# Patient Record
Sex: Female | Born: 1960 | Race: Black or African American | Hispanic: No | Marital: Single | Smoking: Never smoker
Health system: Southern US, Community
[De-identification: ages and names within clinical notes are randomized; demographics above are authoritative.]

## PROBLEM LIST (undated history)

## (undated) DIAGNOSIS — F329 Major depressive disorder, single episode, unspecified: Secondary | ICD-10-CM

## (undated) DIAGNOSIS — I10 Essential (primary) hypertension: Secondary | ICD-10-CM

## (undated) DIAGNOSIS — F32A Depression, unspecified: Secondary | ICD-10-CM

## (undated) DIAGNOSIS — F1910 Other psychoactive substance abuse, uncomplicated: Secondary | ICD-10-CM

## (undated) DIAGNOSIS — K219 Gastro-esophageal reflux disease without esophagitis: Secondary | ICD-10-CM

## (undated) DIAGNOSIS — K6289 Other specified diseases of anus and rectum: Secondary | ICD-10-CM

## (undated) DIAGNOSIS — T7840XA Allergy, unspecified, initial encounter: Secondary | ICD-10-CM

## (undated) DIAGNOSIS — F419 Anxiety disorder, unspecified: Secondary | ICD-10-CM

## (undated) HISTORY — DX: Other psychoactive substance abuse, uncomplicated: F19.10

## (undated) HISTORY — PX: TUBAL LIGATION: SHX77

## (undated) HISTORY — DX: Allergy, unspecified, initial encounter: T78.40XA

## (undated) HISTORY — PX: CHOLECYSTECTOMY: SHX55

---

## 2001-03-25 ENCOUNTER — Emergency Department (HOSPITAL_COMMUNITY): Admission: EM | Admit: 2001-03-25 | Discharge: 2001-03-25 | Payer: Self-pay | Admitting: *Deleted

## 2001-03-25 ENCOUNTER — Encounter: Payer: Self-pay | Admitting: *Deleted

## 2001-04-27 ENCOUNTER — Emergency Department (HOSPITAL_COMMUNITY): Admission: EM | Admit: 2001-04-27 | Discharge: 2001-04-27 | Payer: Self-pay | Admitting: Emergency Medicine

## 2001-05-16 ENCOUNTER — Emergency Department (HOSPITAL_COMMUNITY): Admission: EM | Admit: 2001-05-16 | Discharge: 2001-05-16 | Payer: Self-pay | Admitting: Emergency Medicine

## 2001-08-29 ENCOUNTER — Emergency Department (HOSPITAL_COMMUNITY): Admission: EM | Admit: 2001-08-29 | Discharge: 2001-08-29 | Payer: Self-pay | Admitting: *Deleted

## 2002-03-10 ENCOUNTER — Encounter: Payer: Self-pay | Admitting: Family Medicine

## 2002-03-10 ENCOUNTER — Inpatient Hospital Stay (HOSPITAL_COMMUNITY): Admission: AD | Admit: 2002-03-10 | Discharge: 2002-03-12 | Payer: Self-pay | Admitting: Family Medicine

## 2002-05-25 ENCOUNTER — Emergency Department (HOSPITAL_COMMUNITY): Admission: EM | Admit: 2002-05-25 | Discharge: 2002-05-25 | Payer: Self-pay | Admitting: Emergency Medicine

## 2002-06-09 ENCOUNTER — Emergency Department (HOSPITAL_COMMUNITY): Admission: EM | Admit: 2002-06-09 | Discharge: 2002-06-09 | Payer: Self-pay | Admitting: Emergency Medicine

## 2002-06-18 ENCOUNTER — Encounter: Payer: Self-pay | Admitting: Emergency Medicine

## 2002-06-18 ENCOUNTER — Emergency Department (HOSPITAL_COMMUNITY): Admission: EM | Admit: 2002-06-18 | Discharge: 2002-06-18 | Payer: Self-pay | Admitting: Emergency Medicine

## 2002-07-21 ENCOUNTER — Ambulatory Visit (HOSPITAL_COMMUNITY): Admission: RE | Admit: 2002-07-21 | Discharge: 2002-07-21 | Payer: Self-pay | Admitting: Family Medicine

## 2002-07-21 ENCOUNTER — Encounter: Payer: Self-pay | Admitting: Family Medicine

## 2002-10-08 ENCOUNTER — Emergency Department (HOSPITAL_COMMUNITY): Admission: EM | Admit: 2002-10-08 | Discharge: 2002-10-08 | Payer: Self-pay | Admitting: Emergency Medicine

## 2002-10-08 ENCOUNTER — Encounter: Payer: Self-pay | Admitting: Emergency Medicine

## 2003-08-10 ENCOUNTER — Encounter: Payer: Self-pay | Admitting: Family Medicine

## 2003-08-10 ENCOUNTER — Ambulatory Visit (HOSPITAL_COMMUNITY): Admission: RE | Admit: 2003-08-10 | Discharge: 2003-08-10 | Payer: Self-pay | Admitting: Family Medicine

## 2004-03-08 ENCOUNTER — Emergency Department (HOSPITAL_COMMUNITY): Admission: EM | Admit: 2004-03-08 | Discharge: 2004-03-08 | Payer: Self-pay | Admitting: Emergency Medicine

## 2004-05-29 ENCOUNTER — Emergency Department (HOSPITAL_COMMUNITY): Admission: EM | Admit: 2004-05-29 | Discharge: 2004-05-29 | Payer: Self-pay | Admitting: Emergency Medicine

## 2005-01-03 ENCOUNTER — Emergency Department (HOSPITAL_COMMUNITY): Admission: EM | Admit: 2005-01-03 | Discharge: 2005-01-03 | Payer: Self-pay | Admitting: Emergency Medicine

## 2005-03-29 ENCOUNTER — Emergency Department (HOSPITAL_COMMUNITY): Admission: EM | Admit: 2005-03-29 | Discharge: 2005-03-29 | Payer: Self-pay | Admitting: Emergency Medicine

## 2005-04-19 ENCOUNTER — Emergency Department (HOSPITAL_COMMUNITY): Admission: EM | Admit: 2005-04-19 | Discharge: 2005-04-19 | Payer: Self-pay | Admitting: Emergency Medicine

## 2005-06-19 ENCOUNTER — Emergency Department (HOSPITAL_COMMUNITY): Admission: EM | Admit: 2005-06-19 | Discharge: 2005-06-19 | Payer: Self-pay | Admitting: Emergency Medicine

## 2005-08-15 ENCOUNTER — Emergency Department (HOSPITAL_COMMUNITY): Admission: EM | Admit: 2005-08-15 | Discharge: 2005-08-15 | Payer: Self-pay | Admitting: Emergency Medicine

## 2005-09-24 ENCOUNTER — Emergency Department (HOSPITAL_COMMUNITY): Admission: EM | Admit: 2005-09-24 | Discharge: 2005-09-24 | Payer: Self-pay | Admitting: Emergency Medicine

## 2005-12-01 ENCOUNTER — Ambulatory Visit: Payer: Self-pay | Admitting: Psychiatry

## 2005-12-01 ENCOUNTER — Inpatient Hospital Stay (HOSPITAL_COMMUNITY): Admission: EM | Admit: 2005-12-01 | Discharge: 2005-12-02 | Payer: Self-pay | Admitting: Psychiatry

## 2005-12-04 ENCOUNTER — Emergency Department (HOSPITAL_COMMUNITY): Admission: EM | Admit: 2005-12-04 | Discharge: 2005-12-04 | Payer: Self-pay | Admitting: Emergency Medicine

## 2006-02-21 ENCOUNTER — Emergency Department (HOSPITAL_COMMUNITY): Admission: EM | Admit: 2006-02-21 | Discharge: 2006-02-21 | Payer: Self-pay | Admitting: Emergency Medicine

## 2006-02-25 ENCOUNTER — Emergency Department (HOSPITAL_COMMUNITY): Admission: EM | Admit: 2006-02-25 | Discharge: 2006-02-25 | Payer: Self-pay | Admitting: Emergency Medicine

## 2006-03-11 ENCOUNTER — Ambulatory Visit (HOSPITAL_COMMUNITY): Admission: RE | Admit: 2006-03-11 | Discharge: 2006-03-11 | Payer: Self-pay | Admitting: Family Medicine

## 2006-06-12 ENCOUNTER — Emergency Department (HOSPITAL_COMMUNITY): Admission: EM | Admit: 2006-06-12 | Discharge: 2006-06-12 | Payer: Self-pay | Admitting: Emergency Medicine

## 2006-07-08 ENCOUNTER — Emergency Department (HOSPITAL_COMMUNITY): Admission: EM | Admit: 2006-07-08 | Discharge: 2006-07-08 | Payer: Self-pay | Admitting: Emergency Medicine

## 2007-06-16 ENCOUNTER — Ambulatory Visit (HOSPITAL_COMMUNITY): Admission: RE | Admit: 2007-06-16 | Discharge: 2007-06-16 | Payer: Self-pay | Admitting: Family Medicine

## 2007-10-06 ENCOUNTER — Emergency Department (HOSPITAL_COMMUNITY): Admission: EM | Admit: 2007-10-06 | Discharge: 2007-10-06 | Payer: Self-pay | Admitting: Emergency Medicine

## 2007-11-19 ENCOUNTER — Emergency Department (HOSPITAL_COMMUNITY): Admission: EM | Admit: 2007-11-19 | Discharge: 2007-11-19 | Payer: Self-pay | Admitting: Emergency Medicine

## 2008-03-24 ENCOUNTER — Emergency Department (HOSPITAL_COMMUNITY): Admission: EM | Admit: 2008-03-24 | Discharge: 2008-03-24 | Payer: Self-pay | Admitting: Emergency Medicine

## 2008-05-05 ENCOUNTER — Emergency Department (HOSPITAL_COMMUNITY): Admission: EM | Admit: 2008-05-05 | Discharge: 2008-05-05 | Payer: Self-pay | Admitting: Emergency Medicine

## 2008-05-27 ENCOUNTER — Observation Stay (HOSPITAL_COMMUNITY): Admission: EM | Admit: 2008-05-27 | Discharge: 2008-05-28 | Payer: Self-pay | Admitting: Emergency Medicine

## 2008-05-29 ENCOUNTER — Emergency Department (HOSPITAL_COMMUNITY): Admission: EM | Admit: 2008-05-29 | Discharge: 2008-05-29 | Payer: Self-pay | Admitting: Emergency Medicine

## 2008-05-31 ENCOUNTER — Emergency Department (HOSPITAL_COMMUNITY): Admission: EM | Admit: 2008-05-31 | Discharge: 2008-05-31 | Payer: Self-pay | Admitting: Emergency Medicine

## 2008-06-16 ENCOUNTER — Ambulatory Visit (HOSPITAL_COMMUNITY): Admission: RE | Admit: 2008-06-16 | Discharge: 2008-06-16 | Payer: Self-pay | Admitting: Family Medicine

## 2009-04-04 ENCOUNTER — Emergency Department (HOSPITAL_COMMUNITY): Admission: EM | Admit: 2009-04-04 | Discharge: 2009-04-04 | Payer: Self-pay | Admitting: Emergency Medicine

## 2009-04-05 ENCOUNTER — Emergency Department (HOSPITAL_COMMUNITY): Admission: EM | Admit: 2009-04-05 | Discharge: 2009-04-05 | Payer: Self-pay | Admitting: Emergency Medicine

## 2009-04-06 ENCOUNTER — Emergency Department (HOSPITAL_COMMUNITY): Admission: EM | Admit: 2009-04-06 | Discharge: 2009-04-06 | Payer: Self-pay | Admitting: Emergency Medicine

## 2009-08-16 ENCOUNTER — Emergency Department (HOSPITAL_COMMUNITY): Admission: EM | Admit: 2009-08-16 | Discharge: 2009-08-16 | Payer: Self-pay | Admitting: Emergency Medicine

## 2009-09-20 ENCOUNTER — Emergency Department (HOSPITAL_COMMUNITY): Admission: EM | Admit: 2009-09-20 | Discharge: 2009-09-20 | Payer: Self-pay | Admitting: Emergency Medicine

## 2009-11-30 ENCOUNTER — Ambulatory Visit (HOSPITAL_COMMUNITY): Admission: RE | Admit: 2009-11-30 | Discharge: 2009-11-30 | Payer: Self-pay | Admitting: Family Medicine

## 2010-01-23 ENCOUNTER — Emergency Department (HOSPITAL_COMMUNITY): Admission: EM | Admit: 2010-01-23 | Discharge: 2010-01-23 | Payer: Self-pay | Admitting: Emergency Medicine

## 2010-05-18 ENCOUNTER — Emergency Department (HOSPITAL_COMMUNITY): Admission: EM | Admit: 2010-05-18 | Discharge: 2010-05-18 | Payer: Self-pay | Admitting: Emergency Medicine

## 2010-05-20 ENCOUNTER — Emergency Department (HOSPITAL_COMMUNITY): Admission: EM | Admit: 2010-05-20 | Discharge: 2010-05-20 | Payer: Self-pay | Admitting: Emergency Medicine

## 2010-08-24 ENCOUNTER — Emergency Department (HOSPITAL_COMMUNITY): Admission: EM | Admit: 2010-08-24 | Discharge: 2010-08-24 | Payer: Self-pay | Admitting: Emergency Medicine

## 2010-08-25 ENCOUNTER — Inpatient Hospital Stay (HOSPITAL_COMMUNITY): Admission: EM | Admit: 2010-08-25 | Discharge: 2010-08-28 | Payer: Self-pay | Admitting: Emergency Medicine

## 2010-08-31 ENCOUNTER — Emergency Department (HOSPITAL_COMMUNITY): Admission: EM | Admit: 2010-08-31 | Discharge: 2010-08-31 | Payer: Self-pay | Admitting: Emergency Medicine

## 2010-11-02 ENCOUNTER — Emergency Department (HOSPITAL_COMMUNITY)
Admission: EM | Admit: 2010-11-02 | Discharge: 2010-11-02 | Payer: Self-pay | Source: Home / Self Care | Admitting: Emergency Medicine

## 2010-11-05 LAB — COMPREHENSIVE METABOLIC PANEL
ALT: 21 U/L (ref 0–35)
AST: 23 U/L (ref 0–37)
Albumin: 3.7 g/dL (ref 3.5–5.2)
Alkaline Phosphatase: 77 U/L (ref 39–117)
BUN: 7 mg/dL (ref 6–23)
CO2: 23 mEq/L (ref 19–32)
Calcium: 9.3 mg/dL (ref 8.4–10.5)
Chloride: 110 mEq/L (ref 96–112)
Creatinine, Ser: 0.8 mg/dL (ref 0.4–1.2)
GFR calc Af Amer: >60 mL/min (ref >60)
GFR calc non Af Amer: >60 mL/min (ref >60)
Glucose, Bld: 105 mg/dL — ABNORMAL HIGH (ref 70–99)
Potassium: 3.6 mEq/L (ref 3.5–5.1)
Sodium: 140 mEq/L (ref 135–145)
Total Bilirubin: 0.7 mg/dL (ref 0.3–1.2)
Total Protein: 7.4 g/dL (ref 6.0–8.3)

## 2010-11-05 LAB — URINALYSIS, ROUTINE W REFLEX MICROSCOPIC
Bilirubin Urine: NEGATIVE
Ketones, ur: NEGATIVE mg/dL
Leukocytes, UA: NEGATIVE
Nitrite: NEGATIVE
Protein, ur: NEGATIVE mg/dL
Specific Gravity, Urine: 1.03 (ref 1.005–1.030)
Urine Glucose, Fasting: NEGATIVE mg/dL
Urobilinogen, UA: 0.2 mg/dL (ref 0.0–1.0)
pH: 5.5 (ref 5.0–8.0)

## 2010-11-05 LAB — PROTIME-INR
INR: 1.02 (ref 0.00–1.49)
Prothrombin Time: 13.6 seconds (ref 11.6–15.2)

## 2010-11-05 LAB — POCT PREGNANCY, URINE: Preg Test, Ur: NEGATIVE

## 2010-11-05 LAB — URINE MICROSCOPIC-ADD ON

## 2010-11-09 ENCOUNTER — Other Ambulatory Visit (HOSPITAL_COMMUNITY): Payer: Self-pay | Admitting: *Deleted

## 2010-11-09 DIAGNOSIS — Z1239 Encounter for other screening for malignant neoplasm of breast: Secondary | ICD-10-CM

## 2010-11-11 ENCOUNTER — Encounter: Payer: Self-pay | Admitting: Family Medicine

## 2010-12-03 ENCOUNTER — Ambulatory Visit (HOSPITAL_COMMUNITY)
Admission: RE | Admit: 2010-12-03 | Discharge: 2010-12-03 | Disposition: A | Discharge status: Home / Self Care | Payer: Medicaid Other | Source: Ambulatory Visit | Attending: Diagnostic Radiology | Admitting: Diagnostic Radiology

## 2010-12-03 DIAGNOSIS — Z1239 Encounter for other screening for malignant neoplasm of breast: Secondary | ICD-10-CM

## 2010-12-03 DIAGNOSIS — Z1231 Encounter for screening mammogram for malignant neoplasm of breast: Secondary | ICD-10-CM | POA: Insufficient documentation

## 2011-01-01 LAB — URINE MICROSCOPIC-ADD ON

## 2011-01-01 LAB — COMPREHENSIVE METABOLIC PANEL
ALT: 432 U/L — ABNORMAL HIGH (ref 0–35)
AST: 564 U/L — ABNORMAL HIGH (ref 0–37)
Albumin: 4.2 g/dL (ref 3.5–5.2)
Alkaline Phosphatase: 93 U/L (ref 39–117)
BUN: 10 mg/dL (ref 6–23)
BUN: 8 mg/dL (ref 6–23)
CO2: 23 mEq/L (ref 19–32)
Calcium: 9.2 mg/dL (ref 8.4–10.5)
Creatinine, Ser: 0.88 mg/dL (ref 0.4–1.2)
Creatinine, Ser: 0.88 mg/dL (ref 0.4–1.2)
GFR calc Af Amer: >60 mL/min (ref >60)
GFR calc Af Amer: >60 mL/min (ref >60)
Glucose, Bld: 116 mg/dL — ABNORMAL HIGH (ref 70–99)
Potassium: 3.2 mEq/L — ABNORMAL LOW (ref 3.5–5.1)
Potassium: 3.3 mEq/L — ABNORMAL LOW (ref 3.5–5.1)
Sodium: 138 mEq/L (ref 135–145)
Sodium: 141 mEq/L (ref 135–145)
Total Bilirubin: 1 mg/dL (ref 0.3–1.2)
Total Bilirubin: 1.1 mg/dL (ref 0.3–1.2)
Total Protein: 7.4 g/dL (ref 6.0–8.3)
Total Protein: 8 g/dL (ref 6.0–8.3)
Total Protein: 8 g/dL (ref 6.0–8.3)

## 2011-01-01 LAB — COMPREHENSIVE METABOLIC PANEL WITH GFR
ALT: 429 U/L — ABNORMAL HIGH (ref 0–35)
ALT: 430 U/L — ABNORMAL HIGH (ref 0–35)
ALT: 460 U/L — ABNORMAL HIGH (ref 0–35)
AST: 321 U/L — ABNORMAL HIGH (ref 0–37)
AST: 428 U/L — ABNORMAL HIGH (ref 0–37)
AST: 479 U/L — ABNORMAL HIGH (ref 0–37)
Albumin: 3.2 g/dL — ABNORMAL LOW (ref 3.5–5.2)
Albumin: 3.4 g/dL — ABNORMAL LOW (ref 3.5–5.2)
Albumin: 3.6 g/dL (ref 3.5–5.2)
Alkaline Phosphatase: 221 U/L — ABNORMAL HIGH (ref 39–117)
Alkaline Phosphatase: 223 U/L — ABNORMAL HIGH (ref 39–117)
Alkaline Phosphatase: 240 U/L — ABNORMAL HIGH (ref 39–117)
BUN: 4 mg/dL — ABNORMAL LOW (ref 6–23)
BUN: 5 mg/dL — ABNORMAL LOW (ref 6–23)
BUN: 5 mg/dL — ABNORMAL LOW (ref 6–23)
CO2: 25 meq/L (ref 19–32)
CO2: 26 meq/L (ref 19–32)
CO2: 27 meq/L (ref 19–32)
Calcium: 8.9 mg/dL (ref 8.4–10.5)
Calcium: 9.1 mg/dL (ref 8.4–10.5)
Calcium: 9.3 mg/dL (ref 8.4–10.5)
Chloride: 104 meq/L (ref 96–112)
Chloride: 107 meq/L (ref 96–112)
Chloride: 109 meq/L (ref 96–112)
Creatinine, Ser: 0.8 mg/dL (ref 0.4–1.2)
Creatinine, Ser: 0.85 mg/dL (ref 0.4–1.2)
Creatinine, Ser: 0.9 mg/dL (ref 0.4–1.2)
GFR calc non Af Amer: >60 mL/min
GFR calc non Af Amer: >60 mL/min
GFR calc non Af Amer: >60 mL/min
Glucose, Bld: 104 mg/dL — ABNORMAL HIGH (ref 70–99)
Glucose, Bld: 108 mg/dL — ABNORMAL HIGH (ref 70–99)
Glucose, Bld: 115 mg/dL — ABNORMAL HIGH (ref 70–99)
Potassium: 3.1 meq/L — ABNORMAL LOW (ref 3.5–5.1)
Potassium: 3.4 meq/L — ABNORMAL LOW (ref 3.5–5.1)
Potassium: 3.7 meq/L (ref 3.5–5.1)
Sodium: 139 meq/L (ref 135–145)
Sodium: 140 meq/L (ref 135–145)
Sodium: 144 meq/L (ref 135–145)
Total Bilirubin: 1.1 mg/dL (ref 0.3–1.2)
Total Bilirubin: 2.1 mg/dL — ABNORMAL HIGH (ref 0.3–1.2)
Total Bilirubin: 2.5 mg/dL — ABNORMAL HIGH (ref 0.3–1.2)
Total Protein: 6.6 g/dL (ref 6.0–8.3)
Total Protein: 6.9 g/dL (ref 6.0–8.3)
Total Protein: 7 g/dL (ref 6.0–8.3)

## 2011-01-01 LAB — URINALYSIS, ROUTINE W REFLEX MICROSCOPIC
Bilirubin Urine: NEGATIVE
Glucose, UA: NEGATIVE mg/dL
Glucose, UA: NEGATIVE mg/dL
Leukocytes, UA: NEGATIVE
Leukocytes, UA: NEGATIVE
Nitrite: NEGATIVE
Protein, ur: 30 mg/dL — AB
Protein, ur: 30 mg/dL — AB
Specific Gravity, Urine: 1.015 (ref 1.005–1.030)
Specific Gravity, Urine: 1.02 (ref 1.005–1.030)
Urobilinogen, UA: 0.2 mg/dL (ref 0.0–1.0)
Urobilinogen, UA: 1 mg/dL (ref 0.0–1.0)
Urobilinogen, UA: 4 mg/dL — ABNORMAL HIGH (ref 0.0–1.0)
pH: 6 (ref 5.0–8.0)
pH: 6 (ref 5.0–8.0)

## 2011-01-01 LAB — ACETAMINOPHEN LEVEL: Acetaminophen (Tylenol), Serum: <10 ug/mL — ABNORMAL LOW (ref 10–30)

## 2011-01-01 LAB — HEPATIC FUNCTION PANEL
ALT: 384 U/L — ABNORMAL HIGH (ref 0–35)
ALT: 401 U/L — ABNORMAL HIGH (ref 0–35)
AST: 336 U/L — ABNORMAL HIGH (ref 0–37)
Albumin: 3.6 g/dL (ref 3.5–5.2)
Alkaline Phosphatase: 230 U/L — ABNORMAL HIGH (ref 39–117)
Alkaline Phosphatase: 236 U/L — ABNORMAL HIGH (ref 39–117)
Bilirubin, Direct: 0.4 mg/dL — ABNORMAL HIGH (ref 0.0–0.3)
Indirect Bilirubin: 0.8 mg/dL (ref 0.3–0.9)
Total Bilirubin: 3.3 mg/dL — ABNORMAL HIGH (ref 0.3–1.2)

## 2011-01-01 LAB — CBC
HCT: 35.3 % — ABNORMAL LOW (ref 36.0–46.0)
HCT: 38.6 % (ref 36.0–46.0)
Hemoglobin: 11.9 g/dL — ABNORMAL LOW (ref 12.0–15.0)
Hemoglobin: 12 g/dL (ref 12.0–15.0)
MCH: 29.8 pg (ref 26.0–34.0)
MCH: 30.1 pg (ref 26.0–34.0)
MCH: 30.3 pg (ref 26.0–34.0)
MCHC: 34 g/dL (ref 30.0–36.0)
MCHC: 34.1 g/dL (ref 30.0–36.0)
MCHC: 34.2 g/dL (ref 30.0–36.0)
MCV: 88.1 fL (ref 78.0–100.0)
MCV: 88.5 fL (ref 78.0–100.0)
MCV: 89 fL (ref 78.0–100.0)
Platelets: 265 K/uL (ref 150–400)
RBC: 4 MIL/uL (ref 3.87–5.11)
RDW: 15.5 % (ref 11.5–15.5)
RDW: 15.6 % — ABNORMAL HIGH (ref 11.5–15.5)
RDW: 15.7 % — ABNORMAL HIGH (ref 11.5–15.5)
WBC: 7.4 10*3/uL (ref 4.0–10.5)
WBC: 9.6 K/uL (ref 4.0–10.5)

## 2011-01-01 LAB — RAPID URINE DRUG SCREEN, HOSP PERFORMED
Amphetamines: NOT DETECTED
Barbiturates: NOT DETECTED
Benzodiazepines: NOT DETECTED
Cocaine: NOT DETECTED
Opiates: NOT DETECTED
Tetrahydrocannabinol: POSITIVE — AB

## 2011-01-01 LAB — DIFFERENTIAL
Basophils Absolute: 0.1 10*3/uL (ref 0.0–0.1)
Basophils Absolute: 0.1 10*3/uL (ref 0.0–0.1)
Basophils Absolute: 0.1 K/uL (ref 0.0–0.1)
Basophils Relative: 1 % (ref 0–1)
Basophils Relative: 2 % — ABNORMAL HIGH (ref 0–1)
Eosinophils Absolute: 0.4 10*3/uL (ref 0.0–0.7)
Eosinophils Absolute: 0.6 K/uL (ref 0.0–0.7)
Eosinophils Relative: 6 % — ABNORMAL HIGH (ref 0–5)
Eosinophils Relative: 9 % — ABNORMAL HIGH (ref 0–5)
Lymphocytes Relative: 33 % (ref 12–46)
Lymphocytes Relative: 34 % (ref 12–46)
Lymphs Abs: 2.1 K/uL (ref 0.7–4.0)
Lymphs Abs: 2.2 10*3/uL (ref 0.7–4.0)
Monocytes Absolute: 0.7 10*3/uL (ref 0.1–1.0)
Monocytes Absolute: 0.7 K/uL (ref 0.1–1.0)
Monocytes Relative: 10 % (ref 3–12)
Monocytes Relative: 10 % (ref 3–12)
Monocytes Relative: 11 % (ref 3–12)
Neutro Abs: 3 K/uL (ref 1.7–7.7)
Neutro Abs: 3.2 10*3/uL (ref 1.7–7.7)
Neutro Abs: 5.3 10*3/uL (ref 1.7–7.7)
Neutrophils Relative %: 44 % (ref 43–77)
Neutrophils Relative %: 47 % (ref 43–77)
Neutrophils Relative %: 53 % (ref 43–77)
Neutrophils Relative %: 56 % (ref 43–77)

## 2011-01-01 LAB — APTT: aPTT: 25 s (ref 24–37)

## 2011-01-01 LAB — LIPASE, BLOOD
Lipase: 27 U/L (ref 11–59)
Lipase: 28 U/L (ref 11–59)

## 2011-01-01 LAB — EPSTEIN-BARR VIRUS VCA ANTIBODY PANEL
EBV EA IgG: 0.92 {ISR} — ABNORMAL HIGH
EBV NA IgG: 3.57 {ISR} — ABNORMAL HIGH
EBV VCA IgG: 2.39 {ISR} — ABNORMAL HIGH
EBV VCA IgM: 0.12 {ISR}

## 2011-01-01 LAB — URINE CULTURE
Colony Count: NO GROWTH
Culture  Setup Time: 201111121943
Culture: NO GROWTH

## 2011-01-01 LAB — CK TOTAL AND CKMB (NOT AT ARMC)
CK, MB: 0.7 ng/mL (ref 0.3–4.0)
Total CK: 85 U/L (ref 7–177)

## 2011-01-01 LAB — PROTIME-INR
INR: 0.95 (ref 0.00–1.49)
Prothrombin Time: 12.9 seconds (ref 11.6–15.2)
Prothrombin Time: 14 seconds (ref 11.6–15.2)

## 2011-01-01 LAB — TROPONIN I: Troponin I: 0.04 ng/mL (ref 0.00–0.06)

## 2011-01-01 LAB — BILIRUBIN, DIRECT: Bilirubin, Direct: 0.8 mg/dL — ABNORMAL HIGH (ref 0.0–0.3)

## 2011-01-01 LAB — HEPATITIS PANEL, ACUTE: Hep A IgM: NEGATIVE

## 2011-01-01 LAB — CMV ANTIBODY, IGG (EIA): CMV Ab - IgG: 5.44 — ABNORMAL HIGH (ref <0.90)

## 2011-01-01 LAB — ETHANOL: Alcohol, Ethyl (B): <5 mg/dL (ref 0–10)

## 2011-01-22 LAB — COMPREHENSIVE METABOLIC PANEL
ALT: 19 U/L (ref 0–35)
BUN: 6 mg/dL (ref 6–23)
CO2: 30 mEq/L (ref 19–32)
Calcium: 9.2 mg/dL (ref 8.4–10.5)
GFR calc non Af Amer: 56 mL/min — ABNORMAL LOW (ref >60)
Glucose, Bld: 98 mg/dL (ref 70–99)
Total Protein: 7.3 g/dL (ref 6.0–8.3)

## 2011-01-22 LAB — CBC
HCT: 37.4 % (ref 36.0–46.0)
Hemoglobin: 12.8 g/dL (ref 12.0–15.0)
MCHC: 34.2 g/dL (ref 30.0–36.0)
MCV: 89.6 fL (ref 78.0–100.0)
RBC: 4.17 MIL/uL (ref 3.87–5.11)
RDW: 15.3 % (ref 11.5–15.5)

## 2011-01-22 LAB — URINALYSIS, ROUTINE W REFLEX MICROSCOPIC
Bilirubin Urine: NEGATIVE
Glucose, UA: NEGATIVE mg/dL
Ketones, ur: NEGATIVE mg/dL
Protein, ur: NEGATIVE mg/dL
pH: 5.5 (ref 5.0–8.0)

## 2011-01-22 LAB — DIFFERENTIAL
Basophils Relative: 1 % (ref 0–1)
Eosinophils Absolute: 0.3 10*3/uL (ref 0.0–0.7)
Lymphs Abs: 2.5 10*3/uL (ref 0.7–4.0)
Monocytes Relative: 9 % (ref 3–12)
Neutro Abs: 2.8 10*3/uL (ref 1.7–7.7)
Neutrophils Relative %: 45 % (ref 43–77)

## 2011-01-22 LAB — LIPASE, BLOOD: Lipase: 16 U/L (ref 11–59)

## 2011-01-24 LAB — URINALYSIS, ROUTINE W REFLEX MICROSCOPIC
Bilirubin Urine: NEGATIVE
Ketones, ur: NEGATIVE mg/dL
Nitrite: NEGATIVE
Specific Gravity, Urine: 1.02 (ref 1.005–1.030)
Urobilinogen, UA: 0.2 mg/dL (ref 0.0–1.0)
pH: 6 (ref 5.0–8.0)

## 2011-01-24 LAB — URINE CULTURE

## 2011-01-24 LAB — PREGNANCY, URINE: Preg Test, Ur: NEGATIVE

## 2011-01-28 LAB — COMPREHENSIVE METABOLIC PANEL
AST: 67 U/L — ABNORMAL HIGH (ref 0–37)
CO2: 30 mEq/L (ref 19–32)
Calcium: 8.8 mg/dL (ref 8.4–10.5)
Creatinine, Ser: 0.84 mg/dL (ref 0.4–1.2)
GFR calc Af Amer: >60 mL/min (ref >60)
GFR calc non Af Amer: >60 mL/min (ref >60)
Sodium: 138 mEq/L (ref 135–145)
Total Protein: 6.8 g/dL (ref 6.0–8.3)

## 2011-01-28 LAB — URINALYSIS, ROUTINE W REFLEX MICROSCOPIC
Leukocytes, UA: NEGATIVE
Nitrite: NEGATIVE
Protein, ur: 30 mg/dL — AB
Specific Gravity, Urine: >1.03 — ABNORMAL HIGH (ref 1.005–1.030)
Urobilinogen, UA: 0.2 mg/dL (ref 0.0–1.0)

## 2011-01-28 LAB — DIFFERENTIAL
Eosinophils Relative: 11 % — ABNORMAL HIGH (ref 0–5)
Lymphocytes Relative: 35 % (ref 12–46)
Lymphs Abs: 2 10*3/uL (ref 0.7–4.0)
Monocytes Relative: 15 % — ABNORMAL HIGH (ref 3–12)

## 2011-01-28 LAB — URINE MICROSCOPIC-ADD ON

## 2011-01-28 LAB — CBC
MCHC: 35.2 g/dL (ref 30.0–36.0)
MCV: 88.1 fL (ref 78.0–100.0)
Platelets: 251 10*3/uL (ref 150–400)
RDW: 15.1 % (ref 11.5–15.5)

## 2011-01-28 LAB — LIPASE, BLOOD: Lipase: 15 U/L (ref 11–59)

## 2011-02-21 ENCOUNTER — Other Ambulatory Visit: Payer: Self-pay | Admitting: Family Medicine

## 2011-02-21 DIAGNOSIS — Z1239 Encounter for other screening for malignant neoplasm of breast: Secondary | ICD-10-CM

## 2011-03-05 NOTE — H&P (Signed)
Janet Mcguire, Janet Mcguire                 ACCOUNT NO.:  [id number]   MEDICAL RECORD NO.:  [id number]          PATIENT TYPE:  OBV   LOCATION:  A312                          FACILITY:  [facility]   PHYSICIAN:  Tilford Pillar, MD      DATE OF BIRTH:  Oct 25, 1960   DATE OF ADMISSION:  05/27/2008  DATE OF DISCHARGE:  08/08/2009LH                              HISTORY & PHYSICAL   CHIEF COMPLAINT:  Abdominal pain, nausea, and vomiting.   HISTORY OF PRESENT ILLNESS:  The patient is a 50 year old female who  presented to [facility] Emergency Department with increasing right lower  quadrant and left lower quadrant abdominal pain.  She describes the pain  as a sharp pain, somewhat greater on the right lower quadrant; this is  acute in onset, is worse with palpation.  There are no relieving  factors.  She has no history of any illnesses, no diarrhea, and no fever  or chills.  She ate the previous day.  She does currently have appetite  and is currently hungry.  Again, denies any fever or chills.  No change  in bowel habits, no melena, no hematochezia, no change in urination, and  no vaginal discharge or bleeding.  Her last period was approximately a  week ago and was normal for her.   PAST MEDICAL HISTORY:  1. Hypertension.  2. Depression.  3. History of urinary tract infections.   PAST SURGICAL HISTORY:  1. Cholecystectomy.  2. Tubal ligation.   MEDICATIONS:  1. Benicar.  2. Lexapro.   ALLERGIES:  No known drug allergies.   SOCIAL HISTORY:  No tobacco use.  Occasional alcohol use.  History of  marijuana usage.   REVIEW OF SYSTEMS:  Essentially unremarkable.  No recent weight changes,  no chest pain, no shortness of breath, no palpitation, no paresthesias,  and no weakness.  GASTROINTESTINAL:  As per HPI.   PHYSICAL EXAMINATION:  VITAL SIGNS:  Temperature 97.1, pulse 61,  respiration 20, and blood pressure 119/63.  GENERAL:  In general, the patient is moderately obese female in no acute  distress.  She is alert and oriented x3.  She is lying in a supine  position, actually in the lateral decubitus position in her hospital  bed.  HEENT:  Pupils are equal, round, and reactive.  Extraocular movements  are intact.  No conjunctival pallor is noted.  Oral mucosa is pink and  moist.  NECK:  Trachea is midline.  No cervical lymphadenopathy.  No thyroid  nodularity is noted.  PULMONARY:  Clear to auscultation, unlabored respirations.  CARDIOVASCULAR:  Regular rate and rhythm.  She has 2+ radial and  dorsalis pedis pulses bilaterally.  ABDOMINAL EXAM:  She has positive bowel sounds.  Abdomen is obese and  soft.  She has moderate-to-severe lower abdominal pain in the right and  left lower quadrant, somewhat greater in the right lower quadrant.  She  does not have any peritoneal signs.  She has no rebound.  She has no  pain specifically at McBurney's point.  She has again no rebound  tenderness or pain with  percussion.  She is actually somewhat  distractible with her pain.  No masses or hernias are noted.  She has no  increased pain with obturator sign.  She has no psoas sign and no pain  with heel tap.  EXTREMITIES:  Warm and dry.   PERTINENT RADIOGRAPHIC AND LABORATORY RESULTS:  CBC:  9.6, hemoglobin  11.7, hematocrit 34.5, and platelets 265.  Basic metabolic panel:  Sodium 139, potassium 3.5, chloride 106, bicarb 25, BUN 6, creatinine  0.74, glucose 111, and calcium is 9.2.  Liver panel is within normal  limits.  Lipase is 10.  UA demonstrates small amount of blood, no  leukocytes, no bacteria are apparent.  CT of the abdomen and pelvis  demonstrates no evidence of any free air in the abdomen.  She does have  some free pelvic fluid.  She also has some minimal __________ in the  right lower quadrant around the pericecal, periovarian area.  Appendix  is visualized slightly, dilated to 7 mm, but no significant wall  thickening is noted with the appendix.   ASSESSMENT AND PLAN:   Abdominal pain.  At this time, she will be  admitted for continued 23-hour observation in regards to her abdominal  pain.  As she does have an appetite, no elevation white blood cell  count, no left shift, no tachycardia, and no fever, my suspicion is that  she has a actual appendicitis that is extremely low and more likely has  a symptomatology consistent with a ruptured ovarian cyst.  However, it  was some of the changes on the CT evaluation.  I will continue to  monitor her closely for the potential of an early appendicitis.  This  was discussed with the patient.  She will be kept in an n.p.o. status,  continued on IV fluid hydration, and continued on minimal pain  medication as to alleviate the chance of obscuring any changes in her  symptomatology.  If any of her signs or symptoms consistent with  appendicitis changes in the next 24 hours, it was discussed with the  patient the potential need for appendectomy, although at this time we  can have a low suspicion of an appendiceal or gastrointestinal or acute  surgical etiology of her symptomatology.      Tilford Pillar, MD  Electronically Signed     BZ/MEDQ  D:  05/30/2008  T:  05/30/2008  Job:  [id number]

## 2011-03-08 NOTE — H&P (Signed)
[facility]  Patient:    SECILY, WALTHOUR Visit Number: [id number] MRN: [id number]          Service Type: MED Location: 3A A311 01 Attending Physician:  Evlyn Courier Dictated by:   Mirna Mires, M.D. Admit Date:  03/10/2002                           History and Physical  DATE OF BIRTH:  07-17-1961  INTRODUCTION:  The patient was a 50 year old single, gravida 1, para 1, abortus 0, unemployed black female from [place], [place].  CHIEF COMPLAINT:  Nagging cough.  HISTORY OF PRESENT ILLNESS:  The patient has been experiencing a nagging cough x 3 days.  Cough is worse at night.  Cough is occasionally productive.  Sputum is described as yellow-white.  History is also significant for general malaise, chest pain secondary to coughing, stomach pain secondary to coughing, wheezing, headache, irritated throat, and shortness of breath.  The patient denies fever, chills, nausea, vomiting, or diarrhea.  Medical history is negative for hypertension, diabetes, tuberculosis, cancer, sickle cell, asthma, and seizure disorder.  MEDICATIONS:  None.  SOCIAL HISTORY:  Habits are positive for use of street drugs (marijuana). Habits are negative for use of tobacco products.  Habit is positive for use of ethanol (one 6-pack of beer periodically within a week).  PAST MEDICAL HISTORY: 1. Positive for hospitalization with pregnancy, exploratory laparotomy    and omentectomy secondary to infection postpartum, salpingo-oophorectomy    and abscess in October 1998. 2. 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 3. Hospitalization for epigastric pain with nausea and vomiting secondary to    acute and chronic cholecystitis and cholelithiasis, treated with    cholecystectomy on December 24, 2000.  FAMILY HISTORY:  Mother deceased in her 68s secondary to complications of end-stage lung disease.  Father in his 55s with history of coronary artery disease.  Seven sisters living,  ranging in age from 76-60s (health is unknown, but one sister has a history of coronary artery disease), two sisters deceased, cause unknown.  Four brothers living, health unknown.  One son, age 41, in good health.  GYNECOLOGICAL HISTORY:  Last menstrual period was in April x 5 days.  REVIEW OF SYSTEMS:  Positive for episodic abdominal pain.  Negative for hemoptysis, bleeding gum, hematemesis, dysuria, gross hematuria, constipation, edema of legs, ecchymotic lesions, unexplained fever, weight loss, ect. Sexually transmitted disease history is negative for gonorrhea, syphilis, herpes, and HIV infection.  PHYSICAL EXAMINATION:  VITAL SIGNS:  In the office temperature was 98.4, pulse 92, respirations 24, blood pressure 140/82.  GENERAL:  Middle-aged, tall, slightly large-framed, alert black female who appeared not to feel well but in no apparent respiratory distress.  SKIN:  Warm and dry.  HEENT:  Head:  Normocephalic.  Ears:  Normal auricles.  External canals patent.  Tympanic membranes pearly gray.  Eyes:  Lids negative for ptosis. Sclerae white.  Pupils round, equal, and reactive to light.  Extraocular movements intact.  Nose:  Positive for chronic right nasal swelling and scar. Nasal canal patent.  No discharge.  Mouth:  No oral lesion.  Dentition good. No bleeding gums.  Posterior pharynx benign.  NECK:  Negative for lymphadenopathy or thyromegaly.  LUNGS:  Positive bilateral wheezes.  Moving air fairly hard.  Audible S1, S2 without murmur, rub, or gallop.  Regular rhythm.  BREASTS:  No skin change, no nodule on palpation.  Nipples erect, without  discharge.  ABDOMEN:  No distention.  Slightly obese.  Hypoactive bowel sounds.  Positive for old healed laparoscopic surgical scar.  Positive for midepigastric tenderness.  No palpable mass, no organomegaly.  PELVIC:  Deferred.  RECTAL:  Deferred.  EXTREMITIES:  No edema.  No joint swelling, no joint redness, no  joint hotness.  NEUROLOGIC:  Alert and oriented to person, place, and time.  Cranial nerves II-XII appeared intact.  LABORATORY DATA:  Chest x-ray demonstrated no active disease.  IMPRESSION:  Primary acute bronchitis with bronchospasm.  PLAN:  IV fluids, IV antibiotics, sputum for Grams stain and culture, IV Solu-Medrol with tapering dose over several days.  Alupent/Atrovent nebulizer treatment q.6h., antitussive medication.  Laboratories:  CBC, urinalysis, comprehensive panel, ABG on room air.  Flumadine at 100 mg p.o. b.i.d. x 1 week.  Diet:  Regular.  Activity:  As tolerated. Dictated by:   Mirna Mires, M.D. Attending Physician:  Evlyn Courier DD:  03/10/02 TD:  03/12/02 Job: [id number]

## 2011-03-08 NOTE — Discharge Summary (Signed)
Janet Mcguire, Janet Mcguire                 ACCOUNT NO.:  [id number]   MEDICAL RECORD NO.:  [id number]          PATIENT TYPE:  IPS   LOCATION:  0500                          FACILITY:  [facility]   PHYSICIAN:  Geoffery Lyons, M.D.      DATE OF BIRTH:  1960-12-07   DATE OF ADMISSION:  12/01/2005  DATE OF DISCHARGE:  12/02/2005                                 DISCHARGE SUMMARY   CHIEF COMPLAINT AND PRESENT ILLNESS:  This was the second admission to [facility] for this 50 year old single African-American female.  Presented with a history of a Tylenol overdose.  Upset with her son, an 59-  year-old.  He has no respect for her, she claims.  He dropped out of school,  not working.  The son lets people in the apartment.  Did not know what to do  with the situation.  Got very upset.   PAST PSYCHIATRIC HISTORY:  Second admission to [facility].  The first  one was five years prior to this admission.  Has been on Prozac in the past  effectively.   ALCOHOL/DRUG HISTORY:  Smokes marijuana.  Endorsed alcohol use, 40 ounces  per week.   MEDICAL HISTORY:  Arterial hypertension.   MEDICATIONS:  Benicar 20 mg per day, enalapril.   PHYSICAL EXAMINATION:  Performed and failed to show any acute findings.   LABORATORY DATA:  TSH 2.754.  Blood chemistry with glucose 108.  Liver  enzymes with SGOT 19, SGPT 15, bilirubin 0.3.  CBC with white blood cells  11.9, hemoglobin 12.1.  Drug screen positive for opiates and marijuana.   MENTAL STATUS EXAM:  Upon admission revealed an alert female whose speech  was normal in rate, tempo and production.  Mood was anxious, depressed.  Affect sad, tearful.  Thought processes logical, coherent and relevant.  Upset with the situation at home with her son.  Denied any active suicidal  or homicidal ideation.  No evidence of delusions.  No hallucinations.  Cognition was well-preserved.   ADMISSION DIAGNOSES:  AXIS I:  Major depression.  Marijuana abuse.  AXIS  II:  No diagnosis.  AXIS III:  Arterial hypertension.  AXIS IV:  Moderate.  AXIS V:  GAF upon admission 35; highest GAF in the last year 60.   HOSPITAL COURSE:  She was admitted.  She was started in individual and group  psychotherapy.  She was given Ativan as needed for anxiety.  She was  maintained on Prozac 20 mg per day.  She was given trazodone for sleep,  Benicar 20 mg per day, Skelaxin 800 mg every eight hours as needed for back  pain, Motrin 400 mg every six hours as needed for back pain.  She did  endorse difficulty with mood, with depression, having a hard time with her  son.  Thought he was smoking marijuana.  Increased conflict.  Very upset.  Took an overdose of pills.  Also having a hard time with her father's death  in 11/03/2022.  She became very upset because of being in the unit.  She wanted  to  go home.  On December 02, 2005, as she was in full contact with reality  and there was no evidence of and suicidal or homicidal ideation and she felt  that she was going to be better at home and she was willing to pursue  outpatient treatment, we went ahead and discharged to outpatient follow-up.   DISCHARGE DIAGNOSES:  AXIS I:  Major depression, recurrent.  Marijuana  abuse.  AXIS II:  No diagnosis.  AXIS III:  Arterial hypertension.  AXIS IV:  Moderate.  AXIS V:  GAF upon discharge 50-55.   DISCHARGE MEDICATIONS:  1.  Prozac 20 mg per day.  2.  Benicar 20 mg per day.  3.  Enalapril 5 mg per day.   FOLLOW UP:  [facility].      Geoffery Lyons, M.D.  Electronically Signed     IL/MEDQ  D:  12/11/2005  T:  12/12/2005  Job:  [id number]

## 2011-03-08 NOTE — Discharge Summary (Signed)
[facility]  Patient:    Janet Mcguire, Janet Mcguire Visit Number: [id number] MRN: [id number]          Service Type: MED Location: 3A A311 01 Attending Physician:  Evlyn Courier Dictated by:   Mirna Mires, M.D. Admit Date:  03/10/2002 Discharge Date: 03/12/2002                             Discharge Summary  DATE OF BIRTH:  1961-09-13  IDENTIFYING DATA:  The patient was a 50 year old, single, gravida 1, para 1, AB 0 unemployed, black female from [place], [place].  CHIEF COMPLAINT:  Nagging cough.  HISTORY OF PRESENT ILLNESS:  The patient had experienced a nagging cough x 3 days.  The cough was worse at night.  The cough was occasionally productive. Sputum was described as yellow white.  The history was also significant for generalized malaise, chest pain secondary to cough, stomach pains secondary to cough, wheezing, headache, irritated throat, and shortness of breath.  MEDICAL HISTORY:  Negative for hypertension, diabetes, tuberculosis, cancer, sickle cell, or seizure disorder.  PRESCRIBED MEDICATIONS ON ADMISSION: 1. Celexa 20 mg p.o. every day. 2. Anaprox 275 mg p.o. q.8h. for pain.  ALLERGIES:  The patient was not allergic to any known medications.  SOCIAL HISTORY:  Habit was positive for use of street drugs (marijuana). Habit was negative for the use of cigarette smoking.  Habit was positive for the use of ethanol (one 6-pack of beer periodically within a week).  PAST MEDICAL HISTORY:  Positive for hospitalization for pregnancy, exploratory laparotomy, omentectomy secondary to infection postpartum, salpingo- oophorectomy, abscess in October 1998, acute gastritis in May 1999, epigastric pain with nausea and vomiting secondary to acute and chronic cholecystitis and cholelithiasis treated with cholecystectomy on December 24, 2000.  FAMILY HISTORY:  Revealed mother, deceased, in her 59s secondary to complications of end-stage lung disease;  father, living, in his 11s with a history of coronary artery disease;  7 sisters living, ranging in age from 43-60 (health unknown general, but one sister has a history of coronary artery disease, 2 sisters deceased cause unknown); four brothers living health unknown; one son living at 54, good health.  Last menstrual period was in April x5 days.  PHYSICAL EXAMINATION:  Vitals in the office revealed the following: Temperature 98.4, pulse 92, respirations 24, blood pressure 140/82.  General appearance was that of a middle-aged, tall, slightly large framed, alert, black female who appeared not to feel well, but in no apparent respiratory distress.   Skin was warm and dry.  Eyes demonstrated no discharge.  Sclerae were white.  Nose was positive for chronic right nasal swelling and scar. Nasal canals were patent.  No nasal discharge was present.  Posterior pharynx was benign.  Neck demonstrated no adenopathy.  Lungs demonstrated bilateral wheezes.  The patient was moving air fairly.  Heart revealed audible S1, S2 without murmur, rub, or gallop.  Rhythm was regular.  Abdomen demonstrated no distention with hypoactive bowel sounds.  Abdominal exam demonstrated old healed laparoscopic surgical scar.  Abdominal exam demonstrated midepigastric tenderness without palpable masses or organomegaly.  Extremities demonstrated no edema, cyanosis or clubbing.  Neurological exam was intact.  Chest x-ray demonstrated no active disease.  LABORATORY DATA:  Significant labs on admission were as follows:  ABGs on room air pH 7.43, pCO2 33.7, pO2 72.9, O2 saturation 95.0%.  White count 8.7, hemoglobin 11.3, hematocrit 33.5, platelets 290,000, neutrophils  62%.  Sodium 138, potassium 3.5, chloride 110, CO2 24, glucose 107, BUN 11, creatinine 0.9, calcium 8.6, total protein 7.4, albumin 3.5, AST 21, ALT 20, ALP 67, total bilirubin 0.6.  Urinalysis:  Specific gravity 1.030, pH 5.0, no glucose, small amount of  hemoglobin, no bilirubin, no ketones, greater than 300 protein, nitrate negative, leukocyte esterase negative, rate bacteria.  HOSPITAL COURSE: Problem #1 - ACUTE BRONCHITIS WITH BRONCHOSPASM:  The patient was treated with IV fluids, IV antibiotics using Rocephin, p.o. Zithromax, IV Solu-Medrol, antitussive medication, Coumadin 100 mg p.o. b.i.d. x1 week, Alupent/Atrovent nebulizer treatment q.6h., Aerobid metered dose inhaler 2 puffs q.12h., regular diet, and other supportive measures.  The patient responded to therapy.  She felt significantly better within the next 12 hours.  She remained alert and oriented to person, place and time throughout this hospitalization.  Lung fields at the time of discharge were clear.  Appetite had improved.  She was afebrile.  The patient was discharged home on small dose of prednisone.  Problem #2 - CHRONIC STREET DRUG USE:  Urine drug screen was positive for cannabinoids.  An outpatient appointment was made for the patient at [facility] pertaining to street drug use.  Problem #3 - PROTEINURIA:  The patients urine will be checked again for protein.  If protein is present, the patient will have an ultrasound of the kidneys and be referred to a nephrologist.  She was discharged to her home on Mar 12, 2002.  She was alert and oriented x3.  DISCHARGE INSTRUCTIONS:  Diet:  Regular.  Activity:  As tolerated.  Avoid alcohol and street drugs.  Follow up with Dr. Loleta Chance x1 week.  DISCHARGE MEDICATIONS: 1. Prednisone 5 mg 1 tablet p.o. t.i.d. x 5 days. 2. Anaprox 275 mg 1 tablet p.o. q.8h. as needed for pain. 3. Albuterol metered dose inhaler 2 puffs 4 times a day. 4. Tussi-Organidin NR liquid 2 teaspoons p.o. q.i.d. 5. Flumadine 100 mg p.o. b.i.d. x4 days. 6. Zithromax 250 mg 1 tablet p.o. every day x2 days.  FINAL PRIMARY DIAGNOSIS:  Acute bronchitis with bronchospasm.  SECONDARY DIAGNOSES: 1. Street drug use. 2. Proteinuria. Dictated by:   Mirna Mires, M.D. Attending Physician:  Evlyn Courier DD:  03/12/02 TD:  03/16/02  Job: [id number]

## 2011-03-08 NOTE — H&P (Signed)
Janet Mcguire, Janet Mcguire                 ACCOUNT NO.:  [id number]   MEDICAL RECORD NO.:  [id number]          PATIENT TYPE:  IPS   LOCATION:  0500                          FACILITY[facility]  [facility]   PHYSICIAN:  Geoffery Lyons, M.D.      DATE OF BIRTH:  July 10, 1961   DATE OF ADMISSION:  12/01/2005  DATE OF DISCHARGE:                         PSYCHIATRIC ADMISSION ASSESSMENT   IDENTIFYING INFORMATION:  This is a 50 year old single African-American  female voluntarily admitted on December 01, 2005.   HISTORY OF PRESENT ILLNESS:  The patient presents with a history of a  Tylenol overdose, taking about 7 Tylenol along with 5 pain pills.  They were  her medications.  She overdosed at home.  The patient reports she has been  very upset with her 109 year old son.  She states he has no respect for her.  He has dropped out of school and is currently not working.  Son also lets  people into her apartment where they are eating and she has concerns.  The  patient does not know what to do about the situation.  She has been sleeping  satisfactory, appetite has been satisfactory.  Denies any psychotic  symptoms.   PAST PSYCHIATRIC HISTORY:  Second admission to [facility], was  here approximately 5 years ago, in the past has been on Prozac which she  reports is effective.   SOCIAL HISTORY:  She is a 4 year old single African-American female with an  19 year old child, lives alone with her son, unemployed and on disability.   FAMILY HISTORY:  None.   ALCOHOL DRUG HISTORY:  Nonsmoker, drinks she states a 40 ounce beer weekly,  smokes marijuana.   PAST MEDICAL HISTORY:  Primary care Sheryl Saintil is Dr. Loleta Chance in [place].  Medical problems are hypertension.   MEDICATIONS:  Believes she has been on Benicar 20 mg daily and Enalapril but  she is unclear of her dosage.   DRUG ALLERGIES:  No known allergies.   PHYSICAL EXAMINATION:  The patient was assessed at [facility] Emergency  Department.  This is a  middle-aged female in no acute distress.  Temperature  is 98.9, heart rate of 100, 20 respirations, blood pressure 120/64.  Urine  drug screen is positive for opiates, positive for THC.  Acetaminophen level  less than 10, potassium is 3.1.   MENTAL STATUS EXAM:  Middle-aged female, again no acute distress.  Her  speech is clear, normal rate and tone.  The patient feels depressed.  Affect  is tearful and sad.  Thought processes are coherent.  The patient is focused  on her situation with her son and feeling very overwhelmed.  Cognitive  function intact.  Memory is good, judgment is fair, insight is fair.   ADMISSION DIAGNOSES:  AXIS I:  Major depressive disorder, recurrent, severe,  THC abuse.  AXIS IV:  Problems with primary support group, economic issues, other  psychosocial problems with lack of support, lack of coping skills.  AXIS V:  Current is 35.   PLAN:  Plan is to stabilize mood and thinking, contract for safety.  We will  initiate  Prozac as it was an effective medication in the past.  We will  consider a family session with son.  The patient is to increase her coping  skills.  We will address her substance abuse.  The patient is to follow up  with mental health.   TENTATIVE LENGTH OF CARE:  4-5 days.      Landry Corporal, N.P.      Geoffery Lyons, M.D.  Electronically Signed    JO/MEDQ  D:  12/02/2005  T:  12/02/2005  Job:  [id number]

## 2011-03-21 ENCOUNTER — Emergency Department (HOSPITAL_COMMUNITY)
Admission: EM | Admit: 2011-03-21 | Discharge: 2011-03-21 | Disposition: A | Discharge status: Home / Self Care | Payer: Medicaid Other | Attending: Emergency Medicine | Admitting: Emergency Medicine

## 2011-03-21 DIAGNOSIS — T63461A Toxic effect of venom of wasps, accidental (unintentional), initial encounter: Secondary | ICD-10-CM | POA: Insufficient documentation

## 2011-03-21 DIAGNOSIS — I10 Essential (primary) hypertension: Secondary | ICD-10-CM | POA: Insufficient documentation

## 2011-03-21 DIAGNOSIS — T6391XA Toxic effect of contact with unspecified venomous animal, accidental (unintentional), initial encounter: Secondary | ICD-10-CM | POA: Insufficient documentation

## 2011-03-21 DIAGNOSIS — K219 Gastro-esophageal reflux disease without esophagitis: Secondary | ICD-10-CM | POA: Insufficient documentation

## 2011-04-01 ENCOUNTER — Emergency Department (HOSPITAL_COMMUNITY): Payer: Medicaid Other

## 2011-04-01 ENCOUNTER — Emergency Department (HOSPITAL_COMMUNITY)
Admission: EM | Admit: 2011-04-01 | Discharge: 2011-04-01 | Disposition: A | Discharge status: Home / Self Care | Payer: Medicaid Other | Attending: Emergency Medicine | Admitting: Emergency Medicine

## 2011-04-01 DIAGNOSIS — R112 Nausea with vomiting, unspecified: Secondary | ICD-10-CM | POA: Insufficient documentation

## 2011-04-01 DIAGNOSIS — I10 Essential (primary) hypertension: Secondary | ICD-10-CM | POA: Insufficient documentation

## 2011-04-01 DIAGNOSIS — R109 Unspecified abdominal pain: Secondary | ICD-10-CM | POA: Insufficient documentation

## 2011-04-01 DIAGNOSIS — M549 Dorsalgia, unspecified: Secondary | ICD-10-CM | POA: Insufficient documentation

## 2011-04-01 DIAGNOSIS — Z79899 Other long term (current) drug therapy: Secondary | ICD-10-CM | POA: Insufficient documentation

## 2011-04-01 LAB — DIFFERENTIAL
Eosinophils Relative: 3 % (ref 0–5)
Lymphocytes Relative: 27 % (ref 12–46)
Lymphs Abs: 2.3 10*3/uL (ref 0.7–4.0)
Monocytes Relative: 6 % (ref 3–12)
Neutrophils Relative %: 63 % (ref 43–77)

## 2011-04-01 LAB — COMPREHENSIVE METABOLIC PANEL
BUN: 13 mg/dL (ref 6–23)
CO2: 29 mEq/L (ref 19–32)
Chloride: 105 mEq/L (ref 96–112)
Creatinine, Ser: 0.8 mg/dL (ref 0.4–1.2)
GFR calc Af Amer: >60 mL/min (ref >60)
GFR calc non Af Amer: >60 mL/min (ref >60)
Total Bilirubin: 0.2 mg/dL — ABNORMAL LOW (ref 0.3–1.2)

## 2011-04-01 LAB — CBC
HCT: 34.6 % — ABNORMAL LOW (ref 36.0–46.0)
MCV: 90.3 fL (ref 78.0–100.0)
RBC: 3.83 MIL/uL — ABNORMAL LOW (ref 3.87–5.11)
WBC: 8.7 10*3/uL (ref 4.0–10.5)

## 2011-04-01 LAB — LIPASE, BLOOD: Lipase: 33 U/L (ref 11–59)

## 2011-07-18 LAB — URINALYSIS, ROUTINE W REFLEX MICROSCOPIC
Glucose, UA: NEGATIVE
Leukocytes, UA: NEGATIVE
Specific Gravity, Urine: 1.02
pH: 6.5

## 2011-07-18 LAB — URINE MICROSCOPIC-ADD ON

## 2011-07-19 LAB — DIFFERENTIAL
Basophils Absolute: 0
Basophils Relative: 1
Eosinophils Absolute: 0.1
Eosinophils Absolute: 0.1
Lymphocytes Relative: 13
Lymphocytes Relative: 18
Lymphocytes Relative: 18
Lymphs Abs: 1.3
Lymphs Abs: 1.7
Lymphs Abs: 1.7
Monocytes Absolute: 0.7
Monocytes Absolute: 0.9
Monocytes Relative: 10
Monocytes Relative: 8
Monocytes Relative: 8
Neutro Abs: 7
Neutro Abs: 7.1
Neutro Abs: 7.7
Neutrophils Relative %: 71
Neutrophils Relative %: 77

## 2011-07-19 LAB — URINALYSIS, ROUTINE W REFLEX MICROSCOPIC
Bilirubin Urine: NEGATIVE
Bilirubin Urine: NEGATIVE
Bilirubin Urine: NEGATIVE
Ketones, ur: NEGATIVE
Leukocytes, UA: NEGATIVE
Nitrite: POSITIVE — AB
Nitrite: NEGATIVE
Nitrite: NEGATIVE
Protein, ur: NEGATIVE
Specific Gravity, Urine: 1.025
Specific Gravity, Urine: 1.01
Specific Gravity, Urine: 1.02
Urobilinogen, UA: 1
Urobilinogen, UA: 0.2
Urobilinogen, UA: 4 — ABNORMAL HIGH
pH: 6.5

## 2011-07-19 LAB — CBC
HCT: 33.7 — ABNORMAL LOW
HCT: 34.5 — ABNORMAL LOW
Hemoglobin: 10.8 — ABNORMAL LOW
Hemoglobin: 11.2 — ABNORMAL LOW
Hemoglobin: 11.3 — ABNORMAL LOW
MCHC: 33.1
MCHC: 33.5
MCHC: 33.8
MCV: 88.4
MCV: 89.9
MCV: 89.9
Platelets: 265
RBC: 3.75 — ABNORMAL LOW
RBC: 3.77 — ABNORMAL LOW
RDW: 15
RDW: 15.6 — ABNORMAL HIGH

## 2011-07-19 LAB — COMPREHENSIVE METABOLIC PANEL
ALT: 24
Albumin: 3.6
BUN: 3 — ABNORMAL LOW
BUN: 6
CO2: 28
Calcium: 8.9
Calcium: 9
Creatinine, Ser: 0.74
Creatinine, Ser: 0.8
Creatinine, Ser: 0.84
GFR calc non Af Amer: >60
Glucose, Bld: 111 — ABNORMAL HIGH
Glucose, Bld: 132 — ABNORMAL HIGH
Sodium: 138
Total Bilirubin: 0.7
Total Protein: 5.7 — ABNORMAL LOW
Total Protein: 7.2

## 2011-07-19 LAB — URINE CULTURE

## 2011-07-19 LAB — URINE MICROSCOPIC-ADD ON

## 2011-07-19 LAB — LIPASE, BLOOD: Lipase: 14

## 2011-07-19 LAB — HCG, SERUM, QUALITATIVE: Preg, Serum: NEGATIVE

## 2011-07-26 LAB — URINALYSIS, ROUTINE W REFLEX MICROSCOPIC
Bilirubin Urine: NEGATIVE
Glucose, UA: NEGATIVE
Specific Gravity, Urine: 1.02
Urobilinogen, UA: 0.2
pH: 6

## 2011-07-26 LAB — DIFFERENTIAL
Lymphocytes Relative: 37
Lymphs Abs: 2.4
Monocytes Absolute: 0.6
Monocytes Relative: 10
Neutro Abs: 3.4
Neutrophils Relative %: 51

## 2011-07-26 LAB — CBC
Hemoglobin: 12.4
RBC: 4.12
WBC: 6.6

## 2011-07-26 LAB — BASIC METABOLIC PANEL
Calcium: 9.2
Creatinine, Ser: 0.91
GFR calc Af Amer: >60
Sodium: 138

## 2011-07-26 LAB — URINE MICROSCOPIC-ADD ON

## 2011-11-05 ENCOUNTER — Emergency Department (HOSPITAL_COMMUNITY)
Admission: EM | Admit: 2011-11-05 | Discharge: 2011-11-05 | Disposition: A | Discharge status: Home / Self Care | Payer: Medicaid Other | Attending: Emergency Medicine | Admitting: Emergency Medicine

## 2011-11-05 ENCOUNTER — Encounter (HOSPITAL_COMMUNITY): Payer: Self-pay | Admitting: *Deleted

## 2011-11-05 ENCOUNTER — Emergency Department (HOSPITAL_COMMUNITY): Payer: Medicaid Other

## 2011-11-05 DIAGNOSIS — F341 Dysthymic disorder: Secondary | ICD-10-CM | POA: Insufficient documentation

## 2011-11-05 DIAGNOSIS — M79643 Pain in unspecified hand: Secondary | ICD-10-CM

## 2011-11-05 DIAGNOSIS — M79609 Pain in unspecified limb: Secondary | ICD-10-CM | POA: Insufficient documentation

## 2011-11-05 DIAGNOSIS — W010XXA Fall on same level from slipping, tripping and stumbling without subsequent striking against object, initial encounter: Secondary | ICD-10-CM | POA: Insufficient documentation

## 2011-11-05 DIAGNOSIS — M25549 Pain in joints of unspecified hand: Secondary | ICD-10-CM | POA: Insufficient documentation

## 2011-11-05 HISTORY — DX: Anxiety disorder, unspecified: F41.9

## 2011-11-05 HISTORY — DX: Depression, unspecified: F32.A

## 2011-11-05 HISTORY — DX: Major depressive disorder, single episode, unspecified: F32.9

## 2011-11-05 MED ORDER — NAPROXEN 500 MG PO TABS: 500.0000 mg | ORAL_TABLET | Freq: Two times a day (BID) | ORAL | Status: DC

## 2011-11-05 MED ORDER — HYDROCODONE-ACETAMINOPHEN 5-325 MG PO TABS: ORAL_TABLET | ORAL | Status: AC

## 2011-11-05 NOTE — ED Notes (Signed)
States fell x 1 month ago, c/o right hand pain ever since.  CMS intact.  No swelling noted.

## 2011-11-05 NOTE — ED Provider Notes (Signed)
History     CSN: [id number]  Arrival date & time 11/05/11  0730   First MD Initiated Contact with Patient 11/05/11 0802      Chief Complaint  Patient presents with  . Hand Pain    (Consider location/radiation/quality/duration/timing/severity/associated sxs/prior treatment) HPI Comments: Patient states she slipped and fell approximately one month ago and landed on an outstretched hand.  C/o persistent pain to her right thumb area since that time.  She denies redness, swelling or numbness  Patient is a 51 y.o. female presenting with hand pain. The history is provided by the patient.  Hand Pain This is a chronic problem. The current episode started 1 to 4 weeks ago. The problem occurs constantly. The problem has been unchanged. Associated symptoms include arthralgias. Pertinent negatives include no fever, joint swelling, myalgias, neck pain, numbness, rash or weakness. Exacerbated by: use, movemetn and palpation. She has tried nothing for the symptoms. The treatment provided no relief.    Past Medical History  Diagnosis Date  . Depression   . Anxiety     History reviewed. No pertinent past surgical history.  History reviewed. No pertinent family history.  History  Substance Use Topics  . Smoking status: Never Smoker   . Smokeless tobacco: Not on file  . Alcohol Use: No    OB History    Grav Para Term Preterm Abortions TAB SAB Ect Mult Living                  Review of Systems  Constitutional: Negative for fever.  HENT: Negative for neck pain.   Musculoskeletal: Positive for arthralgias. Negative for myalgias and joint swelling.  Skin: Negative.  Negative for rash.  Neurological: Negative for weakness and numbness.  Hematological: Does not bruise/bleed easily.  All other systems reviewed and are negative.    Allergies  Review of patient's allergies indicates no known allergies.  Home Medications  No current outpatient prescriptions on file.  BP 138/74  Pulse  75  Temp(Src) 97.9 F (36.6 C) (Oral)  Resp 16  Ht 6\' 1"  (1.854 m)  Wt 235 lb (106.595 kg)  BMI 31.00 kg/m2  SpO2 98%  Physical Exam  Nursing note and vitals reviewed. Constitutional: She is oriented to person, place, and time. She appears well-developed and well-nourished. No distress.  HENT:  Head: Normocephalic and atraumatic.  Cardiovascular: Normal rate, regular rhythm and normal heart sounds.   No murmur heard. Pulmonary/Chest: Effort normal and breath sounds normal.  Musculoskeletal: She exhibits tenderness. She exhibits no edema.       Right hand: She exhibits tenderness. She exhibits normal range of motion, no bony tenderness, normal two-point discrimination, normal capillary refill, no deformity, no laceration and no swelling. normal sensation noted. Normal strength noted. She exhibits no thumb/finger opposition.       Hands: Neurological: She is alert and oriented to person, place, and time. She exhibits normal muscle tone. Coordination normal.  Skin: Skin is warm and dry.    ED Course  Procedures (including critical care time)  Dg Hand Complete Right  11/05/2011  *RADIOLOGY REPORT*  Clinical Data: Pain since a fall 1 month ago.  RIGHT HAND - COMPLETE 3+ VIEW  Comparison: 04/05/2009  Findings: There is no fracture, dislocation, or other acute abnormality.  The patient has mild osteoarthritic changes of the interphalangeal joints of all the digits as well as  mild osteoarthritic spurring of the second through fifth metacarpal phalangeal joints with more extensive osteoarthritis of the first metacarpal  phalangeal joint.  IMPRESSION: No acute abnormalities.  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  Original Report Authenticated By: Gwynn Burly, M.D.    Results for orders placed during the hospital encounter of 11/05/11  GLUCOSE, CAPILLARY      Component Value Range   Glucose-Capillary 83  70 - 99 (mg/dL)      Velcro thumb spica  applied.  Pain improved, NV intact.  Pt tolerated well.      MDM    Diffuse ttp of the proximal thumb.  Slight crepitus.  No edema.  Moderate OA on x-ray.  I will place her in a thumb spica and she agrees to follow-up with Dr. Romeo Apple  Patient / Family / Caregiver understand and agree with initial ED impression and plan with expectations set for ED visit.   Pt feels improved after observation and/or treatment in ED.         Mizuki Hoel L. [place], [place] 11/06/11 2033

## 2011-11-06 ENCOUNTER — Other Ambulatory Visit (HOSPITAL_COMMUNITY): Payer: Self-pay | Admitting: Family Medicine

## 2011-11-06 DIAGNOSIS — Z139 Encounter for screening, unspecified: Secondary | ICD-10-CM

## 2011-11-11 NOTE — ED Provider Notes (Signed)
Medical screening examination/treatment/procedure(s) were performed by non-physician practitioner and as supervising physician I was immediately available for consultation/collaboration.  Nicoletta Dress. Colon Branch, MD 11/11/11 1011

## 2011-11-19 ENCOUNTER — Emergency Department (HOSPITAL_COMMUNITY): Payer: Medicaid Other

## 2011-11-19 ENCOUNTER — Emergency Department (HOSPITAL_COMMUNITY)
Admission: EM | Admit: 2011-11-19 | Discharge: 2011-11-19 | Disposition: A | Discharge status: Home / Self Care | Payer: Medicaid Other | Attending: Emergency Medicine | Admitting: Emergency Medicine

## 2011-11-19 ENCOUNTER — Encounter (HOSPITAL_COMMUNITY): Payer: Self-pay | Admitting: *Deleted

## 2011-11-19 DIAGNOSIS — R000 Tachycardia, unspecified: Secondary | ICD-10-CM | POA: Insufficient documentation

## 2011-11-19 DIAGNOSIS — R062 Wheezing: Secondary | ICD-10-CM | POA: Insufficient documentation

## 2011-11-19 DIAGNOSIS — Z9851 Tubal ligation status: Secondary | ICD-10-CM | POA: Insufficient documentation

## 2011-11-19 DIAGNOSIS — Z9889 Other specified postprocedural states: Secondary | ICD-10-CM | POA: Insufficient documentation

## 2011-11-19 DIAGNOSIS — J111 Influenza due to unidentified influenza virus with other respiratory manifestations: Secondary | ICD-10-CM | POA: Insufficient documentation

## 2011-11-19 LAB — CBC
HCT: 40.7 % (ref 36.0–46.0)
Hemoglobin: 13.7 g/dL (ref 12.0–15.0)
MCH: 29.6 pg (ref 26.0–34.0)
MCV: 87.9 fL (ref 78.0–100.0)
RBC: 4.63 MIL/uL (ref 3.87–5.11)

## 2011-11-19 LAB — BASIC METABOLIC PANEL
CO2: 27 mEq/L (ref 19–32)
Chloride: 107 mEq/L (ref 96–112)
GFR calc non Af Amer: 80 mL/min — ABNORMAL LOW (ref >90)
Glucose, Bld: 125 mg/dL — ABNORMAL HIGH (ref 70–99)
Potassium: 3.2 mEq/L — ABNORMAL LOW (ref 3.5–5.1)
Sodium: 143 mEq/L (ref 135–145)

## 2011-11-19 MED ORDER — IPRATROPIUM BROMIDE 0.02 % IN SOLN
0.5000 mg | Freq: Once | RESPIRATORY_TRACT | Status: AC
  Administered 2011-11-19: 0.5 mg via RESPIRATORY_TRACT
  Filled 2011-11-19: qty 2.5

## 2011-11-19 MED ORDER — METHYLPREDNISOLONE SODIUM SUCC 125 MG IJ SOLR
125.0000 mg | Freq: Once | INTRAMUSCULAR | Status: AC
  Administered 2011-11-19: 125 mg via INTRAVENOUS
  Filled 2011-11-19: qty 2

## 2011-11-19 MED ORDER — PREDNISONE 10 MG PO TABS: 20.0000 mg | ORAL_TABLET | Freq: Every day | ORAL | Status: DC

## 2011-11-19 MED ORDER — SODIUM CHLORIDE 0.9 % IV BOLUS (SEPSIS)
1000.0000 mL | Freq: Once | INTRAVENOUS | Status: AC
  Administered 2011-11-19: 1000 mL via INTRAVENOUS

## 2011-11-19 MED ORDER — ONDANSETRON HCL 4 MG/2ML IJ SOLN
4.0000 mg | Freq: Once | INTRAMUSCULAR | Status: AC
  Administered 2011-11-19: 4 mg via INTRAVENOUS
  Filled 2011-11-19: qty 2

## 2011-11-19 MED ORDER — ALBUTEROL SULFATE (5 MG/ML) 0.5% IN NEBU
2.5000 mg | INHALATION_SOLUTION | Freq: Once | RESPIRATORY_TRACT | Status: AC
  Administered 2011-11-19: 2.5 mg via RESPIRATORY_TRACT
  Filled 2011-11-19: qty 0.5

## 2011-11-19 MED ORDER — ALBUTEROL SULFATE HFA 108 (90 BASE) MCG/ACT IN AERS
2.0000 | INHALATION_SPRAY | Freq: Four times a day (QID) | RESPIRATORY_TRACT | Status: DC
  Administered 2011-11-19: 2 via RESPIRATORY_TRACT
  Filled 2011-11-19 (×2): qty 6.7

## 2011-11-19 MED ORDER — SODIUM CHLORIDE 0.9 % IV SOLN: INTRAVENOUS | Status: DC

## 2011-11-19 NOTE — ED Notes (Signed)
Pt c/o cough, congestion and wheezing since Sunday. States that she is coughing up yellow phlegm.

## 2011-11-19 NOTE — ED Provider Notes (Signed)
History   This chart was scribed for Shelda Jakes, MD by Clarita Crane. The patient was seen in room APA19/APA19 and the patient's care was started at 8:03AM.  CSN: [id number]  Arrival date & time 11/19/11  0745   First MD Initiated Contact with Patient 11/19/11 0756      Chief Complaint  Patient presents with  . Cough    (Consider location/radiation/quality/duration/timing/severity/associated sxs/prior treatment) HPI Janet Mcguire is a 51 y.o. female who presents to the Emergency Department complaining of constant moderate to severe productive cough with yellow sputum onset 2 days ago and persistent since with associated HA, lower back pain, sore throat, nasal congestion, rhinorrhea, nausea and vomiting. Reports having 1-2 episodes per day of vomiting. Denies myalgias, fever, diarrhea, rash, SOB, chest pain. Patient is a non-smoker.   Past Medical History  Diagnosis Date  . Depression   . Anxiety     Past Surgical History  Procedure Date  . Cholecystectomy   . Tubal ligation     History reviewed. No pertinent family history.  History  Substance Use Topics  . Smoking status: Never Smoker   . Smokeless tobacco: Not on file  . Alcohol Use: No    OB History    Grav Para Term Preterm Abortions TAB SAB Ect Mult Living                  Review of Systems  Constitutional: Negative for fever.  HENT: Positive for congestion, sore throat and rhinorrhea. Negative for neck pain.   Eyes: Negative for pain.  Respiratory: Positive for cough. Negative for shortness of breath.   Cardiovascular: Negative for chest pain.  Gastrointestinal: Positive for nausea and vomiting. Negative for abdominal pain and diarrhea.  Genitourinary: Negative for dysuria.  Musculoskeletal: Positive for back pain.  Skin: Negative for rash.  Neurological: Positive for headaches. Negative for weakness.    Allergies  Review of patient's allergies indicates no known allergies.  Home Medications     Current Outpatient Rx  Name Route Sig Dispense Refill  . ADULT MULTIVITAMIN W/MINERALS CH Oral Take 1 tablet by mouth daily.    Marland Kitchen NAPROXEN 500 MG PO TABS Oral Take 1 tablet (500 mg total) by mouth 2 (two) times daily. Take with food 20 tablet 0  . PREDNISONE 10 MG PO TABS Oral Take 2 tablets (20 mg total) by mouth daily. 10 tablet 0    BP 131/76  Pulse 122  Temp(Src) 99.2 F (37.3 C) (Oral)  Resp 22  Ht 6\' 1"  (1.854 m)  Wt 230 lb (104.327 kg)  BMI 30.34 kg/m2  SpO2 93%  Physical Exam  Nursing note and vitals reviewed. Constitutional: She is oriented to person, place, and time. She appears well-developed and well-nourished. No distress.  HENT:  Head: Normocephalic and atraumatic.  Mouth/Throat: Oropharynx is clear and moist. No oropharyngeal exudate.  Eyes: EOM are normal. Pupils are equal, round, and reactive to light.  Neck: Normal range of motion. Neck supple. No tracheal deviation present.  Cardiovascular: Regular rhythm.  Tachycardia present.  Exam reveals no gallop and no friction rub.   No murmur heard. Pulmonary/Chest: Effort normal. She has wheezes. She has no rales.       Rhonchi bilaterally. Wheezing noted via both anterior and posterior auscultation. Coarse breath sounds. Coughing during exam.   Abdominal: Soft. She exhibits no distension.  Musculoskeletal: Normal range of motion. She exhibits no edema.  Lymphadenopathy:    She has no cervical adenopathy.  Neurological:  She is alert and oriented to person, place, and time. No sensory deficit.  Skin: Skin is warm and dry.  Psychiatric: She has a normal mood and affect. Her behavior is normal.    ED Course  Procedures (including critical care time)  DIAGNOSTIC STUDIES: Oxygen Saturation is 98% on room air, normalby my interpretation.    COORDINATION OF CARE: 8:06AM- Patient informed of current plan for treatment in ED and agrees with plan.  9:23AM- Patient reports she is feeling better at this time and  wheezing has improved upon re-evaluation. Will d/c home. Patient agrees with plan at this time.   Results for orders placed during the hospital encounter of 11/19/11  BASIC METABOLIC PANEL      Component Value Range   Sodium 143  135 - 145 (mEq/L)   Potassium 3.2 (*) 3.5 - 5.1 (mEq/L)   Chloride 107  96 - 112 (mEq/L)   CO2 27  19 - 32 (mEq/L)   Glucose, Bld 125 (*) 70 - 99 (mg/dL)   BUN 7  6 - 23 (mg/dL)   Creatinine, Ser 1.19  0.50 - 1.10 (mg/dL)   Calcium 9.6  8.4 - 14.7 (mg/dL)   GFR calc non Af Amer 80 (*) >90 (mL/min)   GFR calc Af Amer >90  >90 (mL/min)  CBC      Component Value Range   WBC 5.8  4.0 - 10.5 (K/uL)   RBC 4.63  3.87 - 5.11 (MIL/uL)   Hemoglobin 13.7  12.0 - 15.0 (g/dL)   HCT 82.9  56.2 - 13.0 (%)   MCV 87.9  78.0 - 100.0 (fL)   MCH 29.6  26.0 - 34.0 (pg)   MCHC 33.7  30.0 - 36.0 (g/dL)   RDW 86.5  78.4 - 69.6 (%)   Platelets 173  150 - 400 (K/uL)    Dg Chest 2 View  11/19/2011  *RADIOLOGY REPORT*  Clinical Data: Cough, congestion, shortness of breath.  CHEST - 2 VIEW  Comparison: 08/31/2010  Findings: Heart and mediastinal contours are within normal limits. No focal opacities or effusions.  No acute bony abnormality.  IMPRESSION: No active cardiopulmonary disease.  Original Report Authenticated By: Cyndie Chime, M.D.     1. Influenza   2. Wheezing       MDM   Wheezing resolved in the emergency department with 125 mg IM Medrol IV and albuterol Atrovent nebulizer patient feels much better patient also given 1 L of fluids IV. Suspect patient has influenza with reactive airway disease no past history of asthma or wheezing patient is a nonsmoker. Will discharge home with Robitussin-DM albuterol inhaler and oral prednisone.  Chest x-ray negative for pneumonia no leukocytosis.      I personally performed the services described in this documentation, which was scribed in my presence. The recorded information has been reviewed and  considered.     Shelda Jakes, MD 11/19/11 [phone]

## 2011-12-06 ENCOUNTER — Ambulatory Visit (HOSPITAL_COMMUNITY): Payer: Medicaid Other

## 2011-12-10 ENCOUNTER — Ambulatory Visit (HOSPITAL_COMMUNITY): Payer: Medicaid Other

## 2012-02-26 ENCOUNTER — Emergency Department (HOSPITAL_COMMUNITY)
Admission: EM | Admit: 2012-02-26 | Discharge: 2012-02-26 | Disposition: A | Discharge status: Home / Self Care | Payer: Medicaid Other | Attending: Emergency Medicine | Admitting: Emergency Medicine

## 2012-02-26 ENCOUNTER — Emergency Department (HOSPITAL_COMMUNITY): Payer: Medicaid Other

## 2012-02-26 ENCOUNTER — Encounter (HOSPITAL_COMMUNITY): Payer: Self-pay | Admitting: *Deleted

## 2012-02-26 DIAGNOSIS — R1110 Vomiting, unspecified: Secondary | ICD-10-CM | POA: Insufficient documentation

## 2012-02-26 DIAGNOSIS — Z72820 Sleep deprivation: Secondary | ICD-10-CM | POA: Insufficient documentation

## 2012-02-26 DIAGNOSIS — Z79899 Other long term (current) drug therapy: Secondary | ICD-10-CM | POA: Insufficient documentation

## 2012-02-26 DIAGNOSIS — R109 Unspecified abdominal pain: Secondary | ICD-10-CM

## 2012-02-26 DIAGNOSIS — R079 Chest pain, unspecified: Secondary | ICD-10-CM | POA: Insufficient documentation

## 2012-02-26 DIAGNOSIS — K219 Gastro-esophageal reflux disease without esophagitis: Secondary | ICD-10-CM | POA: Insufficient documentation

## 2012-02-26 DIAGNOSIS — K5900 Constipation, unspecified: Secondary | ICD-10-CM | POA: Insufficient documentation

## 2012-02-26 DIAGNOSIS — F341 Dysthymic disorder: Secondary | ICD-10-CM | POA: Insufficient documentation

## 2012-02-26 DIAGNOSIS — Z139 Encounter for screening, unspecified: Secondary | ICD-10-CM

## 2012-02-26 DIAGNOSIS — R6883 Chills (without fever): Secondary | ICD-10-CM | POA: Insufficient documentation

## 2012-02-26 DIAGNOSIS — I10 Essential (primary) hypertension: Secondary | ICD-10-CM | POA: Insufficient documentation

## 2012-02-26 DIAGNOSIS — M79609 Pain in unspecified limb: Secondary | ICD-10-CM | POA: Insufficient documentation

## 2012-02-26 DIAGNOSIS — R1012 Left upper quadrant pain: Secondary | ICD-10-CM | POA: Insufficient documentation

## 2012-02-26 HISTORY — DX: Essential (primary) hypertension: I10

## 2012-02-26 LAB — URINALYSIS, ROUTINE W REFLEX MICROSCOPIC
Bilirubin Urine: NEGATIVE
Ketones, ur: NEGATIVE mg/dL
Protein, ur: 30 mg/dL — AB
Urobilinogen, UA: 0.2 mg/dL (ref 0.0–1.0)

## 2012-02-26 LAB — DIFFERENTIAL
Eosinophils Absolute: 0.5 10*3/uL (ref 0.0–0.7)
Lymphs Abs: 2.5 10*3/uL (ref 0.7–4.0)
Monocytes Absolute: 0.5 10*3/uL (ref 0.1–1.0)
Monocytes Relative: 7 % (ref 3–12)
Neutro Abs: 2.9 10*3/uL (ref 1.7–7.7)
Neutrophils Relative %: 45 % (ref 43–77)

## 2012-02-26 LAB — COMPREHENSIVE METABOLIC PANEL
Alkaline Phosphatase: 78 U/L (ref 39–117)
BUN: 9 mg/dL (ref 6–23)
Chloride: 105 mEq/L (ref 96–112)
Creatinine, Ser: 0.81 mg/dL (ref 0.50–1.10)
GFR calc Af Amer: >90 mL/min (ref >90)
GFR calc non Af Amer: 85 mL/min — ABNORMAL LOW (ref >90)
Glucose, Bld: 108 mg/dL — ABNORMAL HIGH (ref 70–99)
Potassium: 3.5 mEq/L (ref 3.5–5.1)
Total Bilirubin: 0.3 mg/dL (ref 0.3–1.2)

## 2012-02-26 LAB — CBC
HCT: 39.3 % (ref 36.0–46.0)
Hemoglobin: 12.7 g/dL (ref 12.0–15.0)
MCH: 29.3 pg (ref 26.0–34.0)
RBC: 4.34 MIL/uL (ref 3.87–5.11)

## 2012-02-26 LAB — LIPASE, BLOOD: Lipase: 26 U/L (ref 11–59)

## 2012-02-26 LAB — RAPID URINE DRUG SCREEN, HOSP PERFORMED
Barbiturates: NOT DETECTED
Cocaine: NOT DETECTED
Tetrahydrocannabinol: POSITIVE — AB

## 2012-02-26 LAB — URINE MICROSCOPIC-ADD ON

## 2012-02-26 MED ORDER — FENTANYL CITRATE 0.05 MG/ML IJ SOLN
50.0000 ug | Freq: Once | INTRAMUSCULAR | Status: AC
  Administered 2012-02-26: 50 ug via INTRAVENOUS
  Filled 2012-02-26: qty 2

## 2012-02-26 MED ORDER — FAMOTIDINE IN NACL 20-0.9 MG/50ML-% IV SOLN
20.0000 mg | Freq: Once | INTRAVENOUS | Status: AC
  Administered 2012-02-26: 20 mg via INTRAVENOUS
  Filled 2012-02-26: qty 50

## 2012-02-26 MED ORDER — MORPHINE SULFATE 4 MG/ML IJ SOLN
4.0000 mg | Freq: Once | INTRAMUSCULAR | Status: AC
  Administered 2012-02-26: 4 mg via INTRAVENOUS
  Filled 2012-02-26: qty 1

## 2012-02-26 MED ORDER — IOHEXOL 300 MG/ML  SOLN
100.0000 mL | Freq: Once | INTRAMUSCULAR | Status: AC | PRN
  Administered 2012-02-26: 100 mL via INTRAVENOUS

## 2012-02-26 MED ORDER — SODIUM CHLORIDE 0.9 % IV SOLN
INTRAVENOUS | Status: DC
  Administered 2012-02-26: 1000 mL via INTRAVENOUS

## 2012-02-26 MED ORDER — ONDANSETRON HCL 4 MG/2ML IJ SOLN
4.0000 mg | Freq: Once | INTRAMUSCULAR | Status: AC
  Administered 2012-02-26: 4 mg via INTRAVENOUS
  Filled 2012-02-26: qty 2

## 2012-02-26 MED ORDER — SODIUM CHLORIDE 0.9 % IV BOLUS (SEPSIS)
1000.0000 mL | Freq: Once | INTRAVENOUS | Status: AC
  Administered 2012-02-26: 1000 mL via INTRAVENOUS

## 2012-02-26 NOTE — Discharge Instructions (Signed)
All of your blood tests today and your ultrasound and abdominal CAT scan were normal. You do not have any inflammation of your intestines, stomach, liver or pancreas. The CT scan does show you are constipated. Take MiraLAX one dose every half hour for 3-4 hours or until you get good results, then take once a day to prevent constipation. You can also take Prilosec over-the-counter twice a day for the next 2 weeks then once a day to see if that also helps improve your abdominal pain. You can follow up with Dr. Loleta Chance if not improving over the next couple days.

## 2012-02-26 NOTE — ED Provider Notes (Signed)
History   This chart was scribed for Ward Givens, MD by Sofie Rower. The patient was seen in room APA04/APA04 and the patient's care was started at 7:40 AM     CSN: [id number]  Arrival date & time 02/26/12  0710   First MD Initiated Contact with Patient 02/26/12 [phone]      Chief Complaint  Patient presents with  . Chest Pain  . Abdominal Pain    (Consider location/radiation/quality/duration/timing/severity/associated sxs/prior treatment) HPI   Janet Mcguire is a 51 y.o. female who presents to the Emergency Department complaining of chest pain onset this morning with associated symptoms of loss of sleep, chills off and on for the past week, foul odor of urine. Patient states she was awakened at 6:30 this morning with pain in the center of her chest described as tightness. T The pt also complains of abdominal pain located at the left upper quadrant onset one month ago with associated symptoms of vomiting. Modifying factors include eating of certain foods, drinking alcohol which intensifies the abdominal pain. She has tried nothing that makes it feel better. The pt also complains of thumb pain located at the right thumb shows that she has had for a long time and is also been evaluated for in the ER and by her family physician.. . Pt has a hx of high blood pressure, depression. Patient's is not sure she is depressed now but she is quick to state she is not suicidal..    Pt denies any prior history of chest pain.She denies  diarrhea, cough, fever, dysuria,blood in vomit, melena.   Pt drinks alcohol, one beer a week.   PCP is Dr. Mirna Mires  Past Medical History  Diagnosis Date  . Depression   . Anxiety   . Hypertension    arthritis in right thumb, waiting for orthopedic referral by Dr. Connye Burkitt  Past Surgical History  Procedure Date  . Cholecystectomy   . Tubal ligation       History  Substance Use Topics  . Smoking status: Never Smoker   . Smokeless tobacco: Not on file  .  Alcohol Use: No   patient states she drinks one beer last night, she states she drinks about one beer a week.  OB History    Grav Para Term Preterm Abortions TAB SAB Ect Mult Living                  Review of Systems  All other systems reviewed and are negative.    10 Systems reviewed and all are negative for acute change except as noted in the HPI.    Allergies  Review of patient's allergies indicates no known allergies.  Home Medications   Current Outpatient Rx  Name Route Sig Dispense Refill  . ALBUTEROL SULFATE HFA 108 (90 BASE) MCG/ACT IN AERS Inhalation Inhale 2 puffs into the lungs every 6 (six) hours as needed. Shortness of Breath/Wheezing    . CITALOPRAM HYDROBROMIDE 40 MG PO TABS Oral Take 40 mg by mouth daily.    Marland Kitchen HYDROCHLOROTHIAZIDE 25 MG PO TABS Oral Take 12.5 mg by mouth daily.    . ADULT MULTIVITAMIN W/MINERALS CH Oral Take 1 tablet by mouth daily.      BP 148/86  Pulse 92  Temp(Src) 98.4 F (36.9 C) (Oral)  Resp 16  Ht 6\' 1"  (1.854 m)  Wt 231 lb (104.781 kg)  BMI 30.48 kg/m2  SpO2 98%  Vital signs normal    Physical Exam  Nursing note and vitals reviewed. Constitutional: She is oriented to person, place, and time. She appears well-developed and well-nourished.  HENT:  Head: Normocephalic and atraumatic.  Right Ear: External ear normal.  Left Ear: External ear normal.  Nose: Nose normal.  Mouth/Throat: Oropharynx is clear and moist.       MM minimallly moist  Eyes: Conjunctivae and EOM are normal. Pupils are equal, round, and reactive to light.  Neck: Normal range of motion. Neck supple.  Cardiovascular: Normal rate and normal heart sounds.   Pulmonary/Chest: Effort normal and breath sounds normal.  Abdominal: Soft. Bowel sounds are normal. There is tenderness (Located at the LUQ.).       Patient has a well-healed midline lower abdominal scar, patient cannot tell me if she had a C-section or hysterectomy.  Musculoskeletal: Normal range of  motion. She exhibits no edema.  Neurological: She is alert and oriented to person, place, and time.  Skin: Skin is warm and dry.  Psychiatric: Her behavior is normal.       Flat affect    ED Course  Procedures (including critical care time)  View of prior charts show patient was admitted previously for abdominal pain felt to have alcoholic hepatitis. She states she's never been to detox or rehabilitation and is not interested in that.   Medications  0.9 %  sodium chloride infusion (1000 mL Intravenous New Bag/Given 02/26/12 0953)  sodium chloride 0.9 % bolus 1,000 mL (1000 mL Intravenous Given 02/26/12 0822)  morphine 4 MG/ML injection 4 mg (4 mg Intravenous Given 02/26/12 0823)  ondansetron (ZOFRAN) injection 4 mg (4 mg Intravenous Given 02/26/12 0823)  famotidine (PEPCID) IVPB 20 mg (0 mg Intravenous Stopped 02/26/12 1051)  fentaNYL (SUBLIMAZE) injection 50 mcg (50 mcg Intravenous Given 02/26/12 0909)  iohexol (OMNIPAQUE) 300 MG/ML solution 100 mL (100 mL Intravenous Contrast Given 02/26/12 1330)     7:49AM- EDP at bedside discusses treatment plan.   9:55AM- Recheck. EDP at bedside discusses treatment plan.   Patient is feeling better at discharge. When discussed results of her CT scan she does state that she does have hard stools at times.   DIAGNOSTIC STUDIES: Oxygen Saturation is 98% on room air, normal by my interpretation.    COORDINATION OF CARE:    Results for orders placed during the hospital encounter of 02/26/12  CBC      Component Value Range   WBC 6.3  4.0 - 10.5 (K/uL)   RBC 4.34  3.87 - 5.11 (MIL/uL)   Hemoglobin 12.7  12.0 - 15.0 (g/dL)   HCT 16.1  09.6 - 04.5 (%)   MCV 90.6  78.0 - 100.0 (fL)   MCH 29.3  26.0 - 34.0 (pg)   MCHC 32.3  30.0 - 36.0 (g/dL)   RDW 40.9  81.1 - 91.4 (%)   Platelets 320  150 - 400 (K/uL)  DIFFERENTIAL      Component Value Range   Neutrophils Relative 45  43 - 77 (%)   Neutro Abs 2.9  1.7 - 7.7 (K/uL)   Lymphocytes Relative 39  12 -  46 (%)   Lymphs Abs 2.5  0.7 - 4.0 (K/uL)   Monocytes Relative 7  3 - 12 (%)   Monocytes Absolute 0.5  0.1 - 1.0 (K/uL)   Eosinophils Relative 7 (*) 0 - 5 (%)   Eosinophils Absolute 0.5  0.0 - 0.7 (K/uL)   Basophils Relative 1  0 - 1 (%)   Basophils Absolute 0.1  0.0 -  0.1 (K/uL)  COMPREHENSIVE METABOLIC PANEL      Component Value Range   Sodium 140  135 - 145 (mEq/L)   Potassium 3.5  3.5 - 5.1 (mEq/L)   Chloride 105  96 - 112 (mEq/L)   CO2 26  19 - 32 (mEq/L)   Glucose, Bld 108 (*) 70 - 99 (mg/dL)   BUN 9  6 - 23 (mg/dL)   Creatinine, Ser 1.61  0.50 - 1.10 (mg/dL)   Calcium 9.1  8.4 - 09.6 (mg/dL)   Total Protein 7.6  6.0 - 8.3 (g/dL)   Albumin 3.6  3.5 - 5.2 (g/dL)   AST 25  0 - 37 (U/L)   ALT 23  0 - 35 (U/L)   Alkaline Phosphatase 78  39 - 117 (U/L)   Total Bilirubin 0.3  0.3 - 1.2 (mg/dL)   GFR calc non Af Amer 85 (*) >90 (mL/min)   GFR calc Af Amer >90  >90 (mL/min)  LIPASE, BLOOD      Component Value Range   Lipase 26  11 - 59 (U/L)  ETHANOL      Component Value Range   Alcohol, Ethyl (B) <11  0 - 11 (mg/dL)  URINALYSIS, ROUTINE W REFLEX MICROSCOPIC      Component Value Range   Color, Urine YELLOW  YELLOW    APPearance CLEAR  CLEAR    Specific Gravity, Urine 1.015  1.005 - 1.030    pH 6.0  5.0 - 8.0    Glucose, UA NEGATIVE  NEGATIVE (mg/dL)   Hgb urine dipstick TRACE (*) NEGATIVE    Bilirubin Urine NEGATIVE  NEGATIVE    Ketones, ur NEGATIVE  NEGATIVE (mg/dL)   Protein, ur 30 (*) NEGATIVE (mg/dL)   Urobilinogen, UA 0.2  0.0 - 1.0 (mg/dL)   Nitrite POSITIVE (*) NEGATIVE    Leukocytes, UA NEGATIVE  NEGATIVE   URINE RAPID DRUG SCREEN (HOSP PERFORMED)      Component Value Range   Opiates POSITIVE (*) NONE DETECTED    Cocaine NONE DETECTED  NONE DETECTED    Benzodiazepines NONE DETECTED  NONE DETECTED    Amphetamines NONE DETECTED  NONE DETECTED    Tetrahydrocannabinol POSITIVE (*) NONE DETECTED    Barbiturates NONE DETECTED  NONE DETECTED   URINE  MICROSCOPIC-ADD ON      Component Value Range   Squamous Epithelial / LPF RARE  RARE    WBC, UA 3-6  <3 (WBC/hpf)   RBC / HPF 3-6  <3 (RBC/hpf)   Bacteria, UA MANY (*) RARE     Laboratory interpretation all normal except possible urinary tract infection    US Abdomen Complete  02/26/2012  *RADIOLOGY REPORT*  Clinical Data:  90-month history of left upper quadrant abdominal pain.  Surgical history includes cholecystectomy and appendectomy.  COMPLETE ABDOMINAL ULTRASOUND  Comparison:  CT abdomen and pelvis 08/25/2010, 04/06/2009, 05/27/2008.  Findings:  Gallbladder:  Surgically absent.  Common bile duct:  Mildly dilated as expected post cholecystectomy with maximum diameter approximating 10 mm.  Liver:  Normal size and echotexture without focal parenchymal abnormality.  Patent portal vein with hepatopetal flow.  IVC:  Patent.  Pancreas:  Although the pancreas is difficult to visualize in its entirety, no focal pancreatic abnormality is identified.  The tail was obscured by overlying bowel gas.  Spleen:  Normal size and echotexture without focal parenchymal abnormality.  Right Kidney:  No hydronephrosis.  Well-preserved cortex.  No shadowing calculi.  Normal size and parenchymal echotexture without focal abnormalities.  Approximately 10.0 cm in length.  Left Kidney:  No hydronephrosis.  Well-preserved cortex.  No shadowing calculi.  Normal size and parenchymal echotexture without focal abnormalities.  Approximately 10.4 cm in length.  Abdominal aorta:  Normal in caliber throughout its visualized course in the abdomen without significant atherosclerosis.  The distal aorta was obscured by overlying bowel gas.  IMPRESSION: Normal examination post cholecystectomy with a caveat that the pancreatic tail and distal abdominal aorta were obscured by overlying bowel gas and were therefore not evaluated.  Original Report Authenticated By: Arnell Sieving, M.D.   Ct Abdomen Pelvis W Contrast  02/26/2012  *RADIOLOGY  REPORT*  Clinical Data: 11-month history of left upper quadrant abdominal pain and left flank pain.  Surgical history includes cholecystectomy and bilateral tubal ligation.  CT ABDOMEN AND PELVIS WITH CONTRAST 02/26/2012:  Technique:  Multidetector CT imaging of the abdomen and pelvis was performed following the standard protocol during bolus administration of intravenous contrast.  Contrast: OMNIPAQUE IOHEXOL 300 MG/ML.  Oral contrast was also administered.  Comparison: Abdominal ultrasound performed earlier same date.  CT abdomen and pelvis 08/25/2010, 04/06/2009, 05/27/2008, 02/25/2006, 02/21/2006, 04/19/2005, 10/08/2002.  Findings: Normal appearing liver, spleen, pancreas, adrenal glands, and kidneys.  Gallbladder surgically absent.  No unexpected biliary ductal dilation.  No visible aorto-iliofemoral atherosclerosis.  No significant lymphadenopathy.  Stomach and small bowel normal in appearance.  Large stool burden throughout normal appearing colon.  Normal appendix in the right upper pelvis.  No ascites.  Urinary bladder unremarkable.  Uterus and ovaries normal in appearance for age.  Numerous pelvic phleboliths.  Visualized lung bases clear apart from minimal scarring in the right middle lobe. Bone window images demonstrate mild degenerative changes involving the lumbar spine.  IMPRESSION: No acute abnormalities involving the abdomen or pelvis.  Large stool burden.  Original Report Authenticated By: Arnell Sieving, M.D.   Dg Chest Port 1 View  02/26/2012  *RADIOLOGY REPORT*  Clinical Data: Cough and chest pain  PORTABLE CHEST - 1 VIEW  Comparison: 11/19/2011  Findings: Heart size upper limits of normal.  Lungs are clear.  No pleural effusion.  No acute osseous finding.  IMPRESSION: No acute cardiopulmonary process.  Original Report Authenticated By: Harrel Lemon, M.D.       Date: 02/26/2012  Rate: 76  Rhythm: normal sinus rhythm  QRS Axis: normal  Intervals: normal  ST/T Wave  abnormalities: normal  Conduction Disutrbances:none  Narrative Interpretation:   Old EKG Reviewed: unchanged from 05/31/2008      1 Abdominal  pain, other specified site   2 Chest pain   3. Constipation 4 GERD  Plan discharge  Devoria Albe, MD, FACEP    MDM   I personally performed the services described in this documentation, which was scribed in my presence. The recorded information has been reviewed and considered. Devoria Albe, MD, Armando Gang    Ward Givens, MD 02/26/12 [phone]

## 2012-02-26 NOTE — ED Notes (Signed)
Generalized abdominal pain began a month ago. Vomited x 1 today. Cough present. Pt also states chest discomfort began today.

## 2012-11-02 ENCOUNTER — Other Ambulatory Visit (HOSPITAL_COMMUNITY): Payer: Self-pay | Admitting: Family Medicine

## 2012-11-02 DIAGNOSIS — Z139 Encounter for screening, unspecified: Secondary | ICD-10-CM

## 2012-12-07 ENCOUNTER — Encounter (HOSPITAL_COMMUNITY): Payer: Self-pay | Admitting: *Deleted

## 2012-12-07 ENCOUNTER — Ambulatory Visit (HOSPITAL_COMMUNITY)
Admission: RE | Admit: 2012-12-07 | Discharge: 2012-12-07 | Disposition: A | Discharge status: Home / Self Care | Payer: Medicaid Other | Source: Ambulatory Visit | Attending: Family Medicine | Admitting: Family Medicine

## 2012-12-07 ENCOUNTER — Emergency Department (HOSPITAL_COMMUNITY)
Admission: EM | Admit: 2012-12-07 | Discharge: 2012-12-07 | Disposition: A | Discharge status: Home / Self Care | Payer: Medicaid Other | Attending: Emergency Medicine | Admitting: Emergency Medicine

## 2012-12-07 ENCOUNTER — Emergency Department (HOSPITAL_COMMUNITY): Payer: Medicaid Other

## 2012-12-07 DIAGNOSIS — Z8739 Personal history of other diseases of the musculoskeletal system and connective tissue: Secondary | ICD-10-CM | POA: Insufficient documentation

## 2012-12-07 DIAGNOSIS — F329 Major depressive disorder, single episode, unspecified: Secondary | ICD-10-CM | POA: Insufficient documentation

## 2012-12-07 DIAGNOSIS — Z1231 Encounter for screening mammogram for malignant neoplasm of breast: Secondary | ICD-10-CM | POA: Insufficient documentation

## 2012-12-07 DIAGNOSIS — M25569 Pain in unspecified knee: Secondary | ICD-10-CM | POA: Insufficient documentation

## 2012-12-07 DIAGNOSIS — R05 Cough: Secondary | ICD-10-CM | POA: Insufficient documentation

## 2012-12-07 DIAGNOSIS — R059 Cough, unspecified: Secondary | ICD-10-CM | POA: Insufficient documentation

## 2012-12-07 DIAGNOSIS — F3289 Other specified depressive episodes: Secondary | ICD-10-CM | POA: Insufficient documentation

## 2012-12-07 DIAGNOSIS — M549 Dorsalgia, unspecified: Secondary | ICD-10-CM | POA: Insufficient documentation

## 2012-12-07 DIAGNOSIS — M79609 Pain in unspecified limb: Secondary | ICD-10-CM | POA: Insufficient documentation

## 2012-12-07 DIAGNOSIS — I10 Essential (primary) hypertension: Secondary | ICD-10-CM | POA: Insufficient documentation

## 2012-12-07 DIAGNOSIS — F411 Generalized anxiety disorder: Secondary | ICD-10-CM | POA: Insufficient documentation

## 2012-12-07 DIAGNOSIS — Z79899 Other long term (current) drug therapy: Secondary | ICD-10-CM | POA: Insufficient documentation

## 2012-12-07 DIAGNOSIS — Z139 Encounter for screening, unspecified: Secondary | ICD-10-CM

## 2012-12-07 DIAGNOSIS — K5900 Constipation, unspecified: Secondary | ICD-10-CM | POA: Insufficient documentation

## 2012-12-07 LAB — GLUCOSE, CAPILLARY: Glucose-Capillary: 88 mg/dL (ref 70–99)

## 2012-12-07 MED ORDER — IBUPROFEN 600 MG PO TABS: 600.0000 mg | ORAL_TABLET | Freq: Four times a day (QID) | ORAL | Status: DC | PRN

## 2012-12-07 MED ORDER — TRAMADOL HCL 50 MG PO TABS: 50.0000 mg | ORAL_TABLET | Freq: Four times a day (QID) | ORAL | Status: DC | PRN

## 2012-12-07 MED ORDER — HYDROCODONE-ACETAMINOPHEN 5-325 MG PO TABS
1.0000 | ORAL_TABLET | Freq: Once | ORAL | Status: AC
  Administered 2012-12-07: 1 via ORAL
  Filled 2012-12-07: qty 1

## 2012-12-07 NOTE — ED Notes (Signed)
R knee pain x 3 weeks, worsened over last couple days.  Has not taken any OTC meds, states "They make me feel bad inside."  C/O R hand pain, chronic.  Seen in past in ED for same w/dx of arthritis.  Takes no meds for this.  PMD Dr. Loleta Chance, but states he drew blood work in October and never called her back, so she has not been back to see him. She has not called his office but wants to find new PMD.

## 2012-12-07 NOTE — ED Provider Notes (Signed)
History     CSN: [id number]  Arrival date & time 12/07/12  2130   First MD Initiated Contact with Patient 12/07/12 [phone]      Chief Complaint  Patient presents with  . Hand Pain  . Knee Pain   HPI Janet Mcguire is a 52 y.o. female who presents to the ED with right knee pain. This is a new problem. The onset was gradual. The pain started 3 weeks ago. No known injury. The pain has increased and she rates the pain as 8/10. She had similar pain in her right hand last year and was diagnosed as arthritis. She has not taken any medication for pain. The history was provided by the patient.  Past Medical History  Diagnosis Date  . Depression   . Anxiety   . Hypertension     Past Surgical History  Procedure Laterality Date  . Cholecystectomy    . Tubal ligation      No family history on file.  History  Substance Use Topics  . Smoking status: Never Smoker   . Smokeless tobacco: Not on file  . Alcohol Use: No    OB History   Grav Para Term Preterm Abortions TAB SAB Ect Mult Living                  Review of Systems  Constitutional: Negative for fever, chills, diaphoresis and fatigue.  HENT: Negative for ear pain, congestion, sore throat, facial swelling, neck pain, neck stiffness, dental problem and sinus pressure.   Eyes: Negative for photophobia and visual disturbance.  Respiratory: Positive for cough. Negative for chest tightness and wheezing.   Cardiovascular: Negative for chest pain and palpitations.  Gastrointestinal: Positive for constipation. Negative for nausea, vomiting, abdominal pain, diarrhea and abdominal distention.  Genitourinary: Negative for dysuria, frequency, flank pain and difficulty urinating.  Musculoskeletal: Positive for back pain. Negative for myalgias, joint swelling and gait problem.       Right knee pain  Skin: Negative for color change and rash.  Neurological: Negative for dizziness, seizures, speech difficulty, weakness, light-headedness,  numbness and headaches.  Psychiatric/Behavioral: Negative for confusion and agitation. The patient is not nervous/anxious.     Allergies  Review of patient's allergies indicates no known allergies.  Home Medications   Current Outpatient Rx  Name  Route  Sig  Dispense  Refill  . albuterol (PROVENTIL HFA;VENTOLIN HFA) 108 (90 BASE) MCG/ACT inhaler   Inhalation   Inhale 2 puffs into the lungs every 6 (six) hours as needed. Shortness of Breath/Wheezing         . citalopram (CELEXA) 40 MG tablet   Oral   Take 40 mg by mouth daily.         . hydrochlorothiazide (HYDRODIURIL) 25 MG tablet   Oral   Take 12.5 mg by mouth daily.         . Multiple Vitamin (MULITIVITAMIN WITH MINERALS) TABS   Oral   Take 1 tablet by mouth daily.           There were no vitals taken for this visit.  Physical Exam  Nursing note and vitals reviewed. Constitutional: She is oriented to person, place, and time. She appears well-developed and well-nourished.  HENT:  Head: Normocephalic.  Eyes: EOM are normal. Pupils are equal, round, and reactive to light.  Neck: Normal range of motion. Neck supple.  Cardiovascular: Normal rate.   Pulmonary/Chest: Effort normal.  Musculoskeletal:       Legs: Tender over  right patella. Minimal swelling noted. Pedal pulses strong and equal bilateral, adequate circulation good touch sensation.  Neurological: She is alert and oriented to person, place, and time. No cranial nerve deficit.  Skin: Skin is warm and dry.  Psychiatric: She has a normal mood and affect. Her behavior is normal. Judgment and thought content normal.   Dg Knee Complete 4 Views Right  12/07/2012  *RADIOLOGY REPORT*  Clinical Data: Knee pain for few months without injury.  RIGHT KNEE - COMPLETE 4+ VIEW  Comparison: None.  Findings: Minimal medial and mild lateral compartment joint space narrowing and osteophyte formation.  Mild patellofemoral osteoarthritis.  Possible small suprapatellar joint  effusion. Degenerative irregularity about the lateral tibial spine.  IMPRESSION: Degenerative change, without acute osseous finding.  Possible small suprapatellar joint effusion.   Original Report Authenticated By: Jeronimo Greaves, M.D.     Assessment: 52 y.o. female with right knee pain   Small suprapatellar joint effusion  Plan:  Knee immobilizer   Pain management   Follow up with ortho Procedures  Discussed with the patient and all questioned fully answered. She willreturn if any problems arise.    Medication List    TAKE these medications       ibuprofen 600 MG tablet  Commonly known as:  ADVIL,MOTRIN  Take 1 tablet (600 mg total) by mouth every 6 (six) hours as needed for pain.     traMADol 50 MG tablet  Commonly known as:  ULTRAM  Take 1 tablet (50 mg total) by mouth every 6 (six) hours as needed for pain.      ASK your doctor about these medications       citalopram 40 MG tablet  Commonly known as:  CELEXA  Take 40 mg by mouth daily.     hydrochlorothiazide 25 MG tablet  Commonly known as:  HYDRODIURIL  Take 12.5 mg by mouth daily.     multivitamin with minerals Tabs  Take 1 tablet by mouth daily.              Janet Mcguire, [place] 12/07/12 1047

## 2012-12-07 NOTE — ED Notes (Signed)
Right hand and right knee pain x 3 wks.  Denies injury.

## 2012-12-07 NOTE — ED Notes (Addendum)
Patient with no complaints at this time. Respirations even and unlabored. Skin warm/dry. Discharge instructions reviewed with patient at this time. Patient given opportunity to voice concerns/ask questions. Reviewed My Chart.  Patient discharged at this time and left Emergency Department with steady gait.   

## 2012-12-07 NOTE — ED Provider Notes (Signed)
Medical screening examination/treatment/procedure(s) were performed by non-physician practitioner and as supervising physician I was immediately available for consultation/collaboration.   Lenetta Piche L Aboubacar Matsuo, MD 12/07/12 1535 

## 2013-01-29 ENCOUNTER — Emergency Department (HOSPITAL_COMMUNITY)
Admission: EM | Admit: 2013-01-29 | Discharge: 2013-01-29 | Disposition: A | Discharge status: Home / Self Care | Payer: Medicaid Other | Attending: Emergency Medicine | Admitting: Emergency Medicine

## 2013-01-29 ENCOUNTER — Encounter (HOSPITAL_COMMUNITY): Payer: Self-pay | Admitting: *Deleted

## 2013-01-29 DIAGNOSIS — I10 Essential (primary) hypertension: Secondary | ICD-10-CM | POA: Insufficient documentation

## 2013-01-29 DIAGNOSIS — M25569 Pain in unspecified knee: Secondary | ICD-10-CM | POA: Insufficient documentation

## 2013-01-29 DIAGNOSIS — Z79899 Other long term (current) drug therapy: Secondary | ICD-10-CM | POA: Insufficient documentation

## 2013-01-29 DIAGNOSIS — M25561 Pain in right knee: Secondary | ICD-10-CM

## 2013-01-29 DIAGNOSIS — F3289 Other specified depressive episodes: Secondary | ICD-10-CM | POA: Insufficient documentation

## 2013-01-29 DIAGNOSIS — F329 Major depressive disorder, single episode, unspecified: Secondary | ICD-10-CM | POA: Insufficient documentation

## 2013-01-29 DIAGNOSIS — F411 Generalized anxiety disorder: Secondary | ICD-10-CM | POA: Insufficient documentation

## 2013-01-29 MED ORDER — IBUPROFEN 800 MG PO TABS
800.0000 mg | ORAL_TABLET | Freq: Once | ORAL | Status: AC
  Administered 2013-01-29: 800 mg via ORAL
  Filled 2013-01-29: qty 1

## 2013-01-29 NOTE — ED Notes (Signed)
Pt reports right knee pain since last night, denies any known injury.

## 2013-01-29 NOTE — ED Notes (Signed)
Ace wrap applied to right knee per md order.

## 2013-01-29 NOTE — ED Provider Notes (Signed)
History     This chart was scribed for Zadie Rhine, MD by Smitty Pluck, ED Scribe. The patient was seen in room APA11/APA11 and the patient's care was started at 8:25 AM.   CSN: [id number]  Arrival date & time 01/29/13  0806       Chief Complaint  Patient presents with  . Knee Pain     Patient is a 52 y.o. female presenting with knee pain. The history is provided by the patient. No language interpreter was used.  Knee Pain Location:  Knee Time since incident:  1 day Injury: no   Knee location:  R knee Pain details:    Radiates to:  Does not radiate   Severity:  Moderate   Onset quality:  Sudden   Duration:  1 day   Timing:  Constant   Progression:  Unchanged Chronicity:  Recurrent Dislocation: no   Foreign body present:  No foreign bodies Prior injury to area:  No Relieved by:  Nothing Worsened by:  Adduction and activity Ineffective treatments:  NSAIDs Associated symptoms: no fever   Risk factors: no frequent fractures and no known bone disorder    Janet Mcguire is a 52 y.o. female who presents to the Emergency Department complaining of constant, moderate right knee pain that began last night with no apparent cause.   Pt denies history of surgeries and recent injuries. Pt denies fever, chills, nausea, vomiting, diarrhea, weakness, cough, SOB and any other pain.  She state that 800 mg ibuprofen failed to relieve symptoms.  She has been to ED before and past x-ray showed swelling around knee.    Past Medical History  Diagnosis Date  . Depression   . Anxiety   . Hypertension     Past Surgical History  Procedure Laterality Date  . Cholecystectomy    . Tubal ligation      No family history on file.  History  Substance Use Topics  . Smoking status: Never Smoker   . Smokeless tobacco: Not on file  . Alcohol Use: No    OB History   Grav Para Term Preterm Abortions TAB SAB Ect Mult Living                  Review of Systems  Constitutional: Negative  for fever.  Musculoskeletal: Positive for arthralgias.    Allergies  Review of patient's allergies indicates no known allergies.  Home Medications   Current Outpatient Rx  Name  Route  Sig  Dispense  Refill  . citalopram (CELEXA) 40 MG tablet   Oral   Take 40 mg by mouth daily.         . hydrochlorothiazide (HYDRODIURIL) 25 MG tablet   Oral   Take 12.5 mg by mouth daily.         Marland Kitchen ibuprofen (ADVIL,MOTRIN) 600 MG tablet   Oral   Take 1 tablet (600 mg total) by mouth every 6 (six) hours as needed for pain.   30 tablet   0   . Multiple Vitamin (MULITIVITAMIN WITH MINERALS) TABS   Oral   Take 1 tablet by mouth daily.         . traMADol (ULTRAM) 50 MG tablet   Oral   Take 1 tablet (50 mg total) by mouth every 6 (six) hours as needed for pain.   15 tablet   0     BP 144/69  Pulse 79  Temp(Src) 98.3 F (36.8 C) (Oral)  Resp 18  Ht  6\' 1"  (1.854 m)  Wt 236 lb (107.049 kg)  BMI 31.14 kg/m2  SpO2 100%  Physical Exam  Nursing note and vitals reviewed.  CONSTITUTIONAL: Well developed/well nourished HEAD: Normocephalic/atraumatic EYES: EOMI/PERRL ENMT: Mucous membranes moist NECK: supple no meningeal signs SPINE:entire spine nontender CV: S1/S2 noted, no murmurs/rubs/gallops noted LUNGS: Lungs are clear to auscultation bilaterally, no apparent distress ABDOMEN: soft, nontender, no rebound or guarding GU:no cva tenderness NEURO: Pt is awake/alert, moves all extremitiesx4 EXTREMITIES: tenderness to right patella.  No significant warmth or effusion noted. pulses normal, full ROM No calf tenderness.  No edema noted SKIN: warm, color normal PSYCH: no abnormalities of mood noted   ED Course  Procedures (including critical care time)  DIAGNOSTIC STUDIES: Oxygen Saturation is 100% on room air, normal by my interpretation.    COORDINATION OF CARE: 8:29 AM Advised pt that symptoms may be due to arthritis.  Discussed ED treatment which includes immobilization,  pain medication and f/u with orthopedist with pt and pt agrees.    Pt needs f/u with ortho.  She is well appearing and she can bear weight.  She wants ace wrap or knee sleeve.  Doubt septic joint or any other acute orthopedic/vascular emergency    MDM  Nursing notes including past medical history and social history reviewed and considered in documentation   I personally performed the services described in this documentation, which was scribed in my presence. The recorded information has been reviewed and is accurate.          Joya Gaskins, MD 01/29/13 [phone]

## 2013-01-29 NOTE — ED Notes (Signed)
C/o right knee pain onset last night while lying in bed - denies injury.

## 2013-08-07 ENCOUNTER — Emergency Department (HOSPITAL_COMMUNITY)
Admission: EM | Admit: 2013-08-07 | Discharge: 2013-08-07 | Disposition: A | Discharge status: Home / Self Care | Payer: Medicaid Other | Attending: Emergency Medicine | Admitting: Emergency Medicine

## 2013-08-07 ENCOUNTER — Encounter (HOSPITAL_COMMUNITY): Payer: Self-pay | Admitting: Emergency Medicine

## 2013-08-07 DIAGNOSIS — Z79899 Other long term (current) drug therapy: Secondary | ICD-10-CM | POA: Insufficient documentation

## 2013-08-07 DIAGNOSIS — L309 Dermatitis, unspecified: Secondary | ICD-10-CM

## 2013-08-07 DIAGNOSIS — L259 Unspecified contact dermatitis, unspecified cause: Secondary | ICD-10-CM | POA: Insufficient documentation

## 2013-08-07 DIAGNOSIS — F3289 Other specified depressive episodes: Secondary | ICD-10-CM | POA: Insufficient documentation

## 2013-08-07 DIAGNOSIS — F411 Generalized anxiety disorder: Secondary | ICD-10-CM | POA: Insufficient documentation

## 2013-08-07 DIAGNOSIS — I10 Essential (primary) hypertension: Secondary | ICD-10-CM | POA: Insufficient documentation

## 2013-08-07 DIAGNOSIS — F329 Major depressive disorder, single episode, unspecified: Secondary | ICD-10-CM | POA: Insufficient documentation

## 2013-08-07 MED ORDER — TRIAMCINOLONE ACETONIDE 0.1 % EX CREA: TOPICAL_CREAM | Freq: Two times a day (BID) | CUTANEOUS | Status: DC

## 2013-08-07 NOTE — ED Provider Notes (Signed)
CSN: [id number]     Arrival date & time 08/07/13  1024 History   First MD Initiated Contact with Patient 08/07/13 1027     Chief Complaint  Patient presents with  . Rash   (Consider location/radiation/quality/duration/timing/severity/associated sxs/prior Treatment) Patient is a 52 y.o. female presenting with rash. The history is provided by the patient.  Rash Location:  Torso Torso rash location: central chest. Quality: dryness and itchiness   Severity:  Mild Onset quality:  Gradual Duration:  3 days Timing:  Constant Progression:  Unchanged Chronicity:  New Relieved by:  Nothing Worsened by:  Nothing tried Ineffective treatments:  Anti-itch cream Associated symptoms: no abdominal pain, no fever, no headaches, no myalgias, no nausea, no sore throat, not vomiting and not wheezing    Janet Mcguire is a 52 y.o. female who presents to the ED with a rash to her chest. Last weeks she had areas on her arms but they got better. She used OTC itch cream but this area is still there. She describes it as dry and itching. She denies any other problems.  Past Medical History  Diagnosis Date  . Depression   . Anxiety   . Hypertension    Past Surgical History  Procedure Laterality Date  . Cholecystectomy    . Tubal ligation     No family history on file. History  Substance Use Topics  . Smoking status: Never Smoker   . Smokeless tobacco: Not on file  . Alcohol Use: No   OB History   Grav Para Term Preterm Abortions TAB SAB Ect Mult Living                 Review of Systems  Constitutional: Negative for fever and chills.  HENT: Negative for congestion, sore throat and trouble swallowing.   Eyes: Negative for redness.  Respiratory: Negative for cough and wheezing.   Cardiovascular: Negative for chest pain.  Gastrointestinal: Negative for nausea, vomiting and abdominal pain.  Genitourinary: Negative for frequency.  Musculoskeletal: Negative for myalgias.  Skin: Positive for  rash.  Allergic/Immunologic: Negative for immunocompromised state.  Neurological: Negative for headaches.  Psychiatric/Behavioral: The patient is not nervous/anxious.     Allergies  Review of patient's allergies indicates no known allergies.  Home Medications   Current Outpatient Rx  Name  Route  Sig  Dispense  Refill  . citalopram (CELEXA) 40 MG tablet   Oral   Take 40 mg by mouth daily.         . hydrochlorothiazide (HYDRODIURIL) 25 MG tablet   Oral   Take 12.5 mg by mouth daily.         Marland Kitchen ibuprofen (ADVIL,MOTRIN) 600 MG tablet   Oral   Take 1 tablet (600 mg total) by mouth every 6 (six) hours as needed for pain.   30 tablet   0   . Multiple Vitamin (MULITIVITAMIN WITH MINERALS) TABS   Oral   Take 1 tablet by mouth daily.         . traMADol (ULTRAM) 50 MG tablet   Oral   Take 1 tablet (50 mg total) by mouth every 6 (six) hours as needed for pain.   15 tablet   0    BP 123/84  Pulse 70  Resp 18  SpO2 99% Physical Exam  Nursing note and vitals reviewed. Constitutional: She is oriented to person, place, and time. She appears well-developed and well-nourished. No distress.  HENT:  Head: Normocephalic and atraumatic.  Mouth/Throat: Uvula is  midline, oropharynx is clear and moist and mucous membranes are normal.  Eyes: Conjunctivae and EOM are normal.  Neck: Normal range of motion. Neck supple.  Cardiovascular: Normal rate, regular rhythm and normal heart sounds.   Pulmonary/Chest: Effort normal and breath sounds normal.    There is a rash noted to the mid chest area with dryness, raised rough.   Abdominal: Soft. There is no tenderness.  Musculoskeletal: Normal range of motion.  Neurological: She is alert and oriented to person, place, and time. No cranial nerve deficit.  Skin: Rash noted.  Psychiatric: She has a normal mood and affect. Her behavior is normal.    ED Course  Procedures   MDM  52 y.o. female with dry itching rash to mid chest. Will  treat with Kenolg Cream for eczema. I discussed with the patient plan of care and she voices understanding. She will follow up with her PCP or return as needed for any problems.    Medication List    TAKE these medications       triamcinolone cream 0.1 %  Commonly known as:  KENALOG  Apply topically 2 (two) times daily.      ASK your doctor about these medications       citalopram 40 MG tablet  Commonly known as:  CELEXA  Take 40 mg by mouth daily.     hydrochlorothiazide 25 MG tablet  Commonly known as:  HYDRODIURIL  Take 12.5 mg by mouth daily.     ibuprofen 600 MG tablet  Commonly known as:  ADVIL,MOTRIN  Take 1 tablet (600 mg total) by mouth every 6 (six) hours as needed for pain.     multivitamin with minerals Tabs tablet  Take 1 tablet by mouth daily.     traMADol 50 MG tablet  Commonly known as:  ULTRAM  Take 1 tablet (50 mg total) by mouth every 6 (six) hours as needed for pain.          [facility] Orlene Och, NP 08/07/13 1050

## 2013-08-07 NOTE — ED Notes (Signed)
Pt reports rash to chest area for 2-3 days. Had the same on her right arm, but went away. Has not tried any medications.

## 2013-08-07 NOTE — ED Provider Notes (Signed)
Medical screening examination/treatment/procedure(s) were performed by non-physician practitioner and as supervising physician I was immediately available for consultation/collaboration. Devoria Albe, MD, Armando Gang   Ward Givens, MD 08/07/13 [phone]

## 2013-08-07 NOTE — ED Notes (Signed)
Patient with no complaints at this time. Respirations even and unlabored. Skin warm/dry. Discharge instructions reviewed with patient at this time. Patient given opportunity to voice concerns/ask questions. Patient discharged at this time and left Emergency Department with steady gait.   

## 2013-09-10 ENCOUNTER — Emergency Department (HOSPITAL_COMMUNITY): Payer: Medicaid Other

## 2013-09-10 ENCOUNTER — Emergency Department (HOSPITAL_COMMUNITY)
Admission: EM | Admit: 2013-09-10 | Discharge: 2013-09-10 | Disposition: A | Discharge status: Home / Self Care | Payer: Medicaid Other | Attending: Emergency Medicine | Admitting: Emergency Medicine

## 2013-09-10 ENCOUNTER — Encounter (HOSPITAL_COMMUNITY): Payer: Self-pay | Admitting: Emergency Medicine

## 2013-09-10 DIAGNOSIS — R1032 Left lower quadrant pain: Secondary | ICD-10-CM | POA: Insufficient documentation

## 2013-09-10 DIAGNOSIS — F329 Major depressive disorder, single episode, unspecified: Secondary | ICD-10-CM | POA: Insufficient documentation

## 2013-09-10 DIAGNOSIS — I10 Essential (primary) hypertension: Secondary | ICD-10-CM | POA: Insufficient documentation

## 2013-09-10 DIAGNOSIS — F411 Generalized anxiety disorder: Secondary | ICD-10-CM | POA: Insufficient documentation

## 2013-09-10 DIAGNOSIS — M25561 Pain in right knee: Secondary | ICD-10-CM

## 2013-09-10 DIAGNOSIS — M25569 Pain in unspecified knee: Secondary | ICD-10-CM | POA: Insufficient documentation

## 2013-09-10 DIAGNOSIS — R110 Nausea: Secondary | ICD-10-CM | POA: Insufficient documentation

## 2013-09-10 DIAGNOSIS — Z79899 Other long term (current) drug therapy: Secondary | ICD-10-CM | POA: Insufficient documentation

## 2013-09-10 DIAGNOSIS — R509 Fever, unspecified: Secondary | ICD-10-CM | POA: Insufficient documentation

## 2013-09-10 DIAGNOSIS — F3289 Other specified depressive episodes: Secondary | ICD-10-CM | POA: Insufficient documentation

## 2013-09-10 DIAGNOSIS — R109 Unspecified abdominal pain: Secondary | ICD-10-CM

## 2013-09-10 LAB — CBC WITH DIFFERENTIAL/PLATELET
Basophils Relative: 1 % (ref 0–1)
Eosinophils Absolute: 0.3 10*3/uL (ref 0.0–0.7)
Eosinophils Relative: 5 % (ref 0–5)
HCT: 37.4 % (ref 36.0–46.0)
Hemoglobin: 12.2 g/dL (ref 12.0–15.0)
MCH: 29.3 pg (ref 26.0–34.0)
MCHC: 32.6 g/dL (ref 30.0–36.0)
Monocytes Absolute: 0.6 10*3/uL (ref 0.1–1.0)
Monocytes Relative: 9 % (ref 3–12)
RDW: 15.6 % — ABNORMAL HIGH (ref 11.5–15.5)

## 2013-09-10 LAB — COMPREHENSIVE METABOLIC PANEL
AST: 18 U/L (ref 0–37)
Albumin: 3.5 g/dL (ref 3.5–5.2)
BUN: 14 mg/dL (ref 6–23)
Calcium: 9.7 mg/dL (ref 8.4–10.5)
Creatinine, Ser: 0.81 mg/dL (ref 0.50–1.10)
Potassium: 3.5 mEq/L (ref 3.5–5.1)
Total Bilirubin: 0.4 mg/dL (ref 0.3–1.2)
Total Protein: 7.7 g/dL (ref 6.0–8.3)

## 2013-09-10 MED ORDER — TRAMADOL HCL 50 MG PO TABS: 50.0000 mg | ORAL_TABLET | Freq: Four times a day (QID) | ORAL | Status: DC | PRN

## 2013-09-10 NOTE — ED Notes (Addendum)
Patient w/chronic abdominal pain, ongoing x 1 year.  Describes pain as midabdominal, sharp, burning, intermittent pain.  Worse after eating.  Occasional nausea and vomiting, denies diarrhea. Uses ibuprofen for pain, but it doesn't help.  Doesn't take any antacids. Last BM was sometime early this week. Usually only has one a week.  Denies any urinary symptoms.  Sees Dr. Loleta Chance for primary care, but he has not addressed abdominal pain.  Patient also c/o R knee pain.  States pain is intermittent and occasionally causes her to fall.

## 2013-09-10 NOTE — ED Provider Notes (Signed)
CSN: [id number]     Arrival date & time 09/10/13  7829 History   First MD Initiated Contact with Patient 09/10/13 0708     This chart was scribed for Benny Lennert, MD by Manuela Schwartz, ED scribe. This patient was seen in room APA12/APA12 and the patient's care was started at 0708.  Chief Complaint  Patient presents with  . Abdominal Pain  . Knee Pain   Patient is a 52 y.o. female presenting with abdominal pain. The history is provided by the patient. No language interpreter was used.  Abdominal Pain Pain location:  LLQ Pain quality: sharp   Pain radiates to:  Does not radiate Pain severity:  Moderate Onset quality:  Gradual Duration:  2 days Timing:  Constant Progression:  Unchanged Chronicity:  Chronic Context: not recent illness, not recent travel and not trauma   Relieved by:  Nothing Worsened by:  Nothing tried Ineffective treatments:  NSAIDs Associated symptoms: fever and nausea   Associated symptoms: no chest pain, no chills, no cough, no diarrhea, no hematemesis, no hematochezia, no shortness of breath, no sore throat and no vomiting    HPI Comments: NATOSHA BOU is a 52 y.o. female who presents to the Emergency Department complaining of sharp left lower quadrant abdominal pain which she states worsened over the past few days but has been ongoing for about 1 year now. She states her pain is sometimes worse after eating and occasionally becomes nauseated or has emesis episode, denies diarrhea. Tried ibuprofen w/out any relief. Last BM sometime early this week. She states associated fever. She was recently put on a diuretic medicine by her PCP.  She also c/o right knee pain over the past several days.    Past Medical History  Diagnosis Date  . Depression   . Anxiety   . Hypertension    Past Surgical History  Procedure Laterality Date  . Cholecystectomy    . Tubal ligation     History reviewed. No pertinent family history. History  Substance Use Topics  . Smoking  status: Never Smoker   . Smokeless tobacco: Not on file  . Alcohol Use: Yes     Comment: occasionally monthly   OB History   Grav Para Term Preterm Abortions TAB SAB Ect Mult Living                 Review of Systems  Constitutional: Positive for fever. Negative for chills.  HENT: Negative for congestion, rhinorrhea and sore throat.   Respiratory: Negative for cough and shortness of breath.   Cardiovascular: Negative for chest pain.  Gastrointestinal: Positive for nausea and abdominal pain. Negative for vomiting, diarrhea, hematochezia and hematemesis.  Musculoskeletal: Negative for back pain.  Skin: Negative for color change and rash.  Neurological: Negative for syncope.  All other systems reviewed and are negative.   A complete 10 system review of systems was obtained and all systems are negative except as noted in the HPI and PMH.   Allergies  Review of patient's allergies indicates no known allergies.  Home Medications   Current Outpatient Rx  Name  Route  Sig  Dispense  Refill  . citalopram (CELEXA) 40 MG tablet   Oral   Take 40 mg by mouth daily.         . hydrochlorothiazide (HYDRODIURIL) 25 MG tablet   Oral   Take 12.5 mg by mouth daily.         Marland Kitchen ibuprofen (ADVIL,MOTRIN) 600 MG tablet  Oral   Take 1 tablet (600 mg total) by mouth every 6 (six) hours as needed for pain.   30 tablet   0   . triamcinolone cream (KENALOG) 0.1 %   Topical   Apply topically 2 (two) times daily.   30 g   0    Triage Vitals: BP 135/73  Pulse 81  Temp(Src) 98.8 F (37.1 C) (Oral)  Resp 18  Ht 6\' 1"  (1.854 m)  Wt 243 lb (110.224 kg)  BMI 32.07 kg/m2  SpO2 96% Physical Exam  Nursing note and vitals reviewed. Constitutional: She is oriented to person, place, and time. She appears well-developed.  HENT:  Head: Normocephalic.  Eyes: Conjunctivae and EOM are normal. No scleral icterus.  Neck: Neck supple. No thyromegaly present.  Cardiovascular: Normal rate and  regular rhythm.  Exam reveals no gallop and no friction rub.   No murmur heard. Pulmonary/Chest: No stridor. She has no wheezes. She has no rales. She exhibits no tenderness.  Abdominal: Soft. Bowel sounds are normal. She exhibits no distension. There is tenderness (Mild LLQ tenderness to palpation). There is no rebound.     Musculoskeletal: Normal range of motion. She exhibits tenderness (Minor tenderness to palpation of her right patella). She exhibits no edema.  Lymphadenopathy:    She has no cervical adenopathy.  Neurological: She is oriented to person, place, and time. She exhibits normal muscle tone. Coordination normal.  Skin: No rash noted. No erythema.  Psychiatric: She has a normal mood and affect. Her behavior is normal.    ED Course  Procedures (including critical care time) DIAGNOSTIC STUDIES: Oxygen Saturation is 96% on room air, normal by my interpretation.    COORDINATION OF CARE: At 710 AM Discussed treatment plan with patient which includes blood work, abdominal and right knee X-ray. Patient agrees.   Labs Review Labs Reviewed - No data to display Imaging Review No results found.  EKG Interpretation   None       MDM  No diagnosis found. The chart was scribed for me under my direct supervision.  I personally performed the history, physical, and medical decision making and all procedures in the evaluation of this patient.Benny Lennert, MD 09/10/13 1046

## 2013-11-17 ENCOUNTER — Other Ambulatory Visit (HOSPITAL_COMMUNITY): Payer: Self-pay | Admitting: Family Medicine

## 2013-11-17 DIAGNOSIS — Z139 Encounter for screening, unspecified: Secondary | ICD-10-CM

## 2013-11-24 ENCOUNTER — Encounter (HOSPITAL_COMMUNITY): Payer: Self-pay | Admitting: Emergency Medicine

## 2013-11-24 ENCOUNTER — Emergency Department (HOSPITAL_COMMUNITY)
Admission: EM | Admit: 2013-11-24 | Discharge: 2013-11-24 | Disposition: A | Discharge status: Home / Self Care | Payer: Medicaid Other | Attending: Emergency Medicine | Admitting: Emergency Medicine

## 2013-11-24 DIAGNOSIS — R197 Diarrhea, unspecified: Secondary | ICD-10-CM | POA: Insufficient documentation

## 2013-11-24 DIAGNOSIS — R112 Nausea with vomiting, unspecified: Secondary | ICD-10-CM

## 2013-11-24 DIAGNOSIS — R319 Hematuria, unspecified: Secondary | ICD-10-CM | POA: Insufficient documentation

## 2013-11-24 DIAGNOSIS — R1084 Generalized abdominal pain: Secondary | ICD-10-CM | POA: Insufficient documentation

## 2013-11-24 DIAGNOSIS — F3289 Other specified depressive episodes: Secondary | ICD-10-CM | POA: Insufficient documentation

## 2013-11-24 DIAGNOSIS — Z9851 Tubal ligation status: Secondary | ICD-10-CM | POA: Insufficient documentation

## 2013-11-24 DIAGNOSIS — F329 Major depressive disorder, single episode, unspecified: Secondary | ICD-10-CM | POA: Insufficient documentation

## 2013-11-24 DIAGNOSIS — Z79899 Other long term (current) drug therapy: Secondary | ICD-10-CM | POA: Insufficient documentation

## 2013-11-24 DIAGNOSIS — R350 Frequency of micturition: Secondary | ICD-10-CM | POA: Insufficient documentation

## 2013-11-24 DIAGNOSIS — R109 Unspecified abdominal pain: Secondary | ICD-10-CM

## 2013-11-24 DIAGNOSIS — F411 Generalized anxiety disorder: Secondary | ICD-10-CM | POA: Insufficient documentation

## 2013-11-24 DIAGNOSIS — I10 Essential (primary) hypertension: Secondary | ICD-10-CM | POA: Insufficient documentation

## 2013-11-24 DIAGNOSIS — Z9089 Acquired absence of other organs: Secondary | ICD-10-CM | POA: Insufficient documentation

## 2013-11-24 LAB — CBC WITH DIFFERENTIAL/PLATELET
BASOS PCT: 1 % (ref 0–1)
Basophils Absolute: 0.1 10*3/uL (ref 0.0–0.1)
EOS ABS: 0.2 10*3/uL (ref 0.0–0.7)
Eosinophils Relative: 2 % (ref 0–5)
HCT: 38.9 % (ref 36.0–46.0)
Hemoglobin: 12.9 g/dL (ref 12.0–15.0)
Lymphocytes Relative: 26 % (ref 12–46)
Lymphs Abs: 2 10*3/uL (ref 0.7–4.0)
MCH: 29.5 pg (ref 26.0–34.0)
MCHC: 33.2 g/dL (ref 30.0–36.0)
MCV: 88.8 fL (ref 78.0–100.0)
MONO ABS: 0.6 10*3/uL (ref 0.1–1.0)
Monocytes Relative: 8 % (ref 3–12)
NEUTROS PCT: 63 % (ref 43–77)
Neutro Abs: 4.8 10*3/uL (ref 1.7–7.7)
Platelets: 324 10*3/uL (ref 150–400)
RBC: 4.38 MIL/uL (ref 3.87–5.11)
RDW: 15 % (ref 11.5–15.5)
WBC: 7.5 10*3/uL (ref 4.0–10.5)

## 2013-11-24 LAB — BASIC METABOLIC PANEL
BUN: 11 mg/dL (ref 6–23)
CO2: 25 mEq/L (ref 19–32)
CREATININE: 0.85 mg/dL (ref 0.50–1.10)
Calcium: 9.2 mg/dL (ref 8.4–10.5)
Chloride: 106 mEq/L (ref 96–112)
GFR, EST NON AFRICAN AMERICAN: 79 mL/min — AB (ref >90)
Glucose, Bld: 112 mg/dL — ABNORMAL HIGH (ref 70–99)
POTASSIUM: 3.4 meq/L — AB (ref 3.7–5.3)
Sodium: 143 mEq/L (ref 137–147)

## 2013-11-24 LAB — URINALYSIS, ROUTINE W REFLEX MICROSCOPIC
BILIRUBIN URINE: NEGATIVE
Glucose, UA: NEGATIVE mg/dL
KETONES UR: NEGATIVE mg/dL
Leukocytes, UA: NEGATIVE
Nitrite: NEGATIVE
PH: 6 (ref 5.0–8.0)
PROTEIN: 100 mg/dL — AB
Specific Gravity, Urine: >1.03 — ABNORMAL HIGH (ref 1.005–1.030)
UROBILINOGEN UA: 1 mg/dL (ref 0.0–1.0)

## 2013-11-24 LAB — URINE MICROSCOPIC-ADD ON

## 2013-11-24 LAB — HEPATIC FUNCTION PANEL
ALT: 17 U/L (ref 0–35)
AST: 18 U/L (ref 0–37)
Albumin: 3.7 g/dL (ref 3.5–5.2)
Alkaline Phosphatase: 89 U/L (ref 39–117)
BILIRUBIN TOTAL: 0.4 mg/dL (ref 0.3–1.2)
Total Protein: 8.3 g/dL (ref 6.0–8.3)

## 2013-11-24 LAB — LIPASE, BLOOD: LIPASE: 24 U/L (ref 11–59)

## 2013-11-24 MED ORDER — MORPHINE SULFATE 4 MG/ML IJ SOLN
4.0000 mg | Freq: Once | INTRAMUSCULAR | Status: AC
  Administered 2013-11-24: 4 mg via INTRAVENOUS
  Filled 2013-11-24: qty 1

## 2013-11-24 MED ORDER — ONDANSETRON HCL 4 MG/2ML IJ SOLN
4.0000 mg | Freq: Once | INTRAMUSCULAR | Status: AC
  Administered 2013-11-24: 4 mg via INTRAVENOUS
  Filled 2013-11-24: qty 2

## 2013-11-24 MED ORDER — PROMETHAZINE HCL 25 MG PO TABS: 25.0000 mg | ORAL_TABLET | Freq: Four times a day (QID) | ORAL | Status: DC | PRN

## 2013-11-24 MED ORDER — HYDROCODONE-ACETAMINOPHEN 5-325 MG PO TABS: 1.0000 | ORAL_TABLET | Freq: Four times a day (QID) | ORAL | Status: DC | PRN

## 2013-11-24 MED ORDER — SODIUM CHLORIDE 0.9 % IV BOLUS (SEPSIS)
1000.0000 mL | Freq: Once | INTRAVENOUS | Status: AC
  Administered 2013-11-24: 1000 mL via INTRAVENOUS

## 2013-11-24 MED ORDER — CEPHALEXIN 500 MG PO CAPS: ORAL_CAPSULE | ORAL | Status: DC

## 2013-11-24 NOTE — ED Provider Notes (Signed)
CSN: [id number]     Arrival date & time 11/24/13  0821 History   First MD Initiated Contact with Patient 11/24/13 [phone]     Chief Complaint  Patient presents with  . Emesis  . Abdominal Pain   (Consider location/radiation/quality/duration/timing/severity/associated sxs/prior Treatment) HPI Comments: Patient is a 53 year old female with prior history of cholecystectomy and tubal ligation who presents today with 2 days of generalized abdominal pain. She describes her abdominal pain as "sore". It began shortly after eating "corn cheese". It is not abnormal for her to eat his food, but she believes it is possible this is what caused her symptoms. Since that time she has been having nausea, vomiting, diarrhea. Her emesis is nonbloody. She has been pressing on her stomach since that time which makes her pain worse. She admits to urinary frequency. No vaginal discharge or abnormal vaginal bleeding. No fever, chills, shortness of breath, chest pain.  The history is provided by the patient. No language interpreter was used.    Past Medical History  Diagnosis Date  . Depression   . Anxiety   . Hypertension    Past Surgical History  Procedure Laterality Date  . Cholecystectomy    . Tubal ligation     No family history on file. History  Substance Use Topics  . Smoking status: Never Smoker   . Smokeless tobacco: Not on file  . Alcohol Use: Yes     Comment: occasionally monthly   OB History   Grav Para Term Preterm Abortions TAB SAB Ect Mult Living                 Review of Systems  Constitutional: Negative for fever and chills.  Gastrointestinal: Positive for nausea, vomiting, abdominal pain and diarrhea.  Genitourinary: Positive for frequency.  All other systems reviewed and are negative.    Allergies  Review of patient's allergies indicates no known allergies.  Home Medications   Current Outpatient Rx  Name  Route  Sig  Dispense  Refill  . citalopram (CELEXA) 40 MG tablet  Oral   Take 40 mg by mouth daily.         . hydrochlorothiazide (HYDRODIURIL) 25 MG tablet   Oral   Take 12.5 mg by mouth daily.         Marland Kitchen omeprazole (PRILOSEC) 20 MG capsule   Oral   Take 20 mg by mouth daily.         . traMADol (ULTRAM) 50 MG tablet   Oral   Take 1 tablet (50 mg total) by mouth every 6 (six) hours as needed.   15 tablet   0    BP 133/91  Pulse 102  Temp(Src) 98.7 F (37.1 C) (Oral)  Resp 20  Ht 6\' 1"  (1.854 m)  Wt 246 lb (111.585 kg)  BMI 32.46 kg/m2  SpO2 100% Physical Exam  Nursing note and vitals reviewed. Constitutional: She is oriented to person, place, and time. She appears well-developed and well-nourished. No distress.  HENT:  Head: Normocephalic and atraumatic.  Right Ear: External ear normal.  Left Ear: External ear normal.  Nose: Nose normal.  Mouth/Throat: Oropharynx is clear and moist.  Eyes: Conjunctivae are normal.  Neck: Normal range of motion.  Cardiovascular: Normal rate, regular rhythm and normal heart sounds.   Pulmonary/Chest: Effort normal and breath sounds normal. No stridor. No respiratory distress. She has no wheezes. She has no rales.  Abdominal: Soft. Normal appearance and bowel sounds are normal. She exhibits no distension  and no ascites. There is generalized tenderness. There is no rigidity, no rebound, no guarding and no CVA tenderness.  Musculoskeletal: Normal range of motion.  Neurological: She is alert and oriented to person, place, and time. She has normal strength.  Skin: Skin is warm and dry. She is not diaphoretic. No erythema.  Psychiatric: She has a normal mood and affect. Her behavior is normal.    ED Course  Procedures (including critical care time) Labs Review Labs Reviewed  BASIC METABOLIC PANEL - Abnormal; Notable for the following:    Potassium 3.4 (*)    Glucose, Bld 112 (*)    GFR calc non Af Amer 79 (*)    All other components within normal limits  URINALYSIS, ROUTINE W REFLEX MICROSCOPIC  - Abnormal; Notable for the following:    Specific Gravity, Urine >1.030 (*)    Hgb urine dipstick MODERATE (*)    Protein, ur 100 (*)    All other components within normal limits  URINE MICROSCOPIC-ADD ON - Abnormal; Notable for the following:    Squamous Epithelial / LPF MANY (*)    Bacteria, UA MANY (*)    All other components within normal limits  URINE CULTURE  CBC WITH DIFFERENTIAL  HEPATIC FUNCTION PANEL  LIPASE, BLOOD   Imaging Review No results found.  EKG Interpretation   None       MDM   1. Abdominal pain   2. Nausea vomiting and diarrhea   3. Hematuria    Patient is nontoxic, nonseptic appearing, in no apparent distress.  Patient's pain and other symptoms adequately managed in emergency department.  Fluid bolus given.  Labs and vitals reviewed.  Patient does not meet the SIRS or Sepsis criteria.  On repeat exam patient does not have a surgical abdomen and there are no peritoneal signs.  No indication of appendicitis, bowel obstruction, bowel perforation, cholecystitis, diverticulitis, PID or ectopic pregnancy.  Presentation not consistent with ureteral calculus. I do not feel further imaging is warranted at this time. Patient discharged home with symptomatic treatment and given strict instructions for follow-up with their primary care physician.  I have also discussed reasons to return immediately to the ER.  Patient expresses understanding and agrees with plan.      Mora BellmanHannah S Naveen Clardy, PA-C 11/24/13 1023

## 2013-11-24 NOTE — Discharge Instructions (Signed)
Abdominal Pain, Women °Abdominal (stomach, pelvic, or belly) pain can be caused by many things. It is important to tell your doctor: °· The location of the pain. °· Does it come and go or is it present all the time? °· Are there things that start the pain (eating certain foods, exercise)? °· Are there other symptoms associated with the pain (fever, nausea, vomiting, diarrhea)? °All of this is helpful to know when trying to find the cause of the pain. °CAUSES  °· Stomach: virus or bacteria infection, or ulcer. °· Intestine: appendicitis (inflamed appendix), regional ileitis (Crohn's disease), ulcerative colitis (inflamed colon), irritable bowel syndrome, diverticulitis (inflamed diverticulum of the colon), or cancer of the stomach or intestine. °· Gallbladder disease or stones in the gallbladder. °· Kidney disease, kidney stones, or infection. °· Pancreas infection or cancer. °· Fibromyalgia (pain disorder). °· Diseases of the female organs: °· Uterus: fibroid (non-cancerous) tumors or infection. °· Fallopian tubes: infection or tubal pregnancy. °· Ovary: cysts or tumors. °· Pelvic adhesions (scar tissue). °· Endometriosis (uterus lining tissue growing in the pelvis and on the pelvic organs). °· Pelvic congestion syndrome (female organs filling up with blood just before the menstrual period). °· Pain with the menstrual period. °· Pain with ovulation (producing an egg). °· Pain with an IUD (intrauterine device, birth control) in the uterus. °· Cancer of the female organs. °· Functional pain (pain not caused by a disease, may improve without treatment). °· Psychological pain. °· Depression. °DIAGNOSIS  °Your doctor will decide the seriousness of your pain by doing an examination. °· Blood tests. °· X-rays. °· Ultrasound. °· CT scan (computed tomography, special type of X-ray). °· MRI (magnetic resonance imaging). °· Cultures, for infection. °· Barium enema (dye inserted in the large intestine, to better view it with  X-rays). °· Colonoscopy (looking in intestine with a lighted tube). °· Laparoscopy (minor surgery, looking in abdomen with a lighted tube). °· Major abdominal exploratory surgery (looking in abdomen with a large incision). °TREATMENT  °The treatment will depend on the cause of the pain.  °· Many cases can be observed and treated at home. °· Over-the-counter medicines recommended by your caregiver. °· Prescription medicine. °· Antibiotics, for infection. °· Birth control pills, for painful periods or for ovulation pain. °· Hormone treatment, for endometriosis. °· Nerve blocking injections. °· Physical therapy. °· Antidepressants. °· Counseling with a psychologist or psychiatrist. °· Minor or major surgery. °HOME CARE INSTRUCTIONS  °· Do not take laxatives, unless directed by your caregiver. °· Take over-the-counter pain medicine only if ordered by your caregiver. Do not take aspirin because it can cause an upset stomach or bleeding. °· Try a clear liquid diet (broth or water) as ordered by your caregiver. Slowly move to a bland diet, as tolerated, if the pain is related to the stomach or intestine. °· Have a thermometer and take your temperature several times a day, and record it. °· Bed rest and sleep, if it helps the pain. °· Avoid sexual intercourse, if it causes pain. °· Avoid stressful situations. °· Keep your follow-up appointments and tests, as your caregiver orders. °· If the pain does not go away with medicine or surgery, you may try: °· Acupuncture. °· Relaxation exercises (yoga, meditation). °· Group therapy. °· Counseling. °SEEK MEDICAL CARE IF:  °· You notice certain foods cause stomach pain. °· Your home care treatment is not helping your pain. °· You need stronger pain medicine. °· You want your IUD removed. °· You feel faint or   lightheaded.  You develop nausea and vomiting.  You develop a rash.  You are having side effects or an allergy to your medicine. SEEK IMMEDIATE MEDICAL CARE IF:   Your  pain does not go away or gets worse.  You have a fever.  Your pain is felt only in portions of the abdomen. The right side could possibly be appendicitis. The left lower portion of the abdomen could be colitis or diverticulitis.  You are passing blood in your stools (bright red or black tarry stools, with or without vomiting).  You have blood in your urine.  You develop chills, with or without a fever.  You pass out. MAKE SURE YOU:   Understand these instructions.  Will watch your condition.  Will get help right away if you are not doing well or get worse. Document Released: 08/04/2007 Document Revised: 12/30/2011 Document Reviewed: 08/24/2009 [facility] Patient Information 2014 [place], [place].  Hematuria, Adult Hematuria is blood in your urine. It can be caused by a bladder infection, kidney infection, prostate infection, kidney stone, or cancer of your urinary tract. Infections can usually be treated with medicine, and a kidney stone usually will pass through your urine. If neither of these is the cause of your hematuria, further workup to find out the reason may be needed. It is very important that you tell your health care provider about any blood you see in your urine, even if the blood stops without treatment or happens without causing pain. Blood in your urine that happens and then stops and then happens again can be a symptom of a very serious condition. Also, pain is not a symptom in the initial stages of many urinary cancers. HOME CARE INSTRUCTIONS   Drink lots of fluid, 3 4 quarts a day. If you have been diagnosed with an infection, cranberry juice is especially recommended, in addition to large amounts of water.  Avoid caffeine, tea, and carbonated beverages, because they tend to irritate the bladder.  Avoid alcohol because it may irritate the prostate.  Only take over-the-counter or prescription medicines for pain, discomfort, or fever as directed by your health care  provider.  If you have been diagnosed with a kidney stone, follow your health care provider's instructions regarding straining your urine to catch the stone.  Empty your bladder often. Avoid holding urine for long periods of time.  After a bowel movement, women should cleanse front to back. Use each tissue only once.  Empty your bladder before and after sexual intercourse if you are a female. SEEK MEDICAL CARE IF: You develop back pain, fever, a feeling of sickness in your stomach (nausea), or vomiting or if your symptoms are not better in 3 days. Return sooner if you are getting worse. SEEK IMMEDIATE MEDICAL CARE IF:   You have a persistent fever, with a temperature of 101.87F (38.8C) or greater.  You develop severe vomiting and are unable to keep the medicine down.  You develop severe back or abdominal pain despite taking your medicines.  You begin passing a large amount of blood or clots in your urine.  You feel extremely weak or faint, or you pass out. MAKE SURE YOU:   Understand these instructions.  Will watch your condition.  Will get help right away if you are not doing well or get worse. Document Released: 10/07/2005 Document Revised: 07/28/2013 Document Reviewed: 06/07/2013 [facility] Patient Information 2014 [place], [place].  Nausea and Vomiting Nausea means you feel sick to your stomach. Throwing up (vomiting) is a  reflex where stomach contents come out of your mouth. HOME CARE   Take medicine as told by your doctor.  Do not force yourself to eat. However, you do need to drink fluids.  If you feel like eating, eat a normal diet as told by your doctor.  Eat rice, wheat, potatoes, bread, lean meats, yogurt, fruits, and vegetables.  Avoid high-fat foods.  Drink enough fluids to keep your pee (urine) clear or pale yellow.  Ask your doctor how to replace body fluid losses (rehydrate). Signs of body fluid loss (dehydration) include:  Feeling very thirsty.  Dry  lips and mouth.  Feeling dizzy.  Dark pee.  Peeing less than normal.  Feeling confused.  Fast breathing or heart rate. GET HELP RIGHT AWAY IF:   You have blood in your throw up.  You have black or bloody poop (stool).  You have a bad headache or stiff neck.  You feel confused.  You have bad belly (abdominal) pain.  You have chest pain or trouble breathing.  You do not pee at least once every 8 hours.  You have cold, clammy skin.  You keep throwing up after 24 to 48 hours.  You have a fever. MAKE SURE YOU:   Understand these instructions.  Will watch your condition.  Will get help right away if you are not doing well or get worse. Document Released: 03/25/2008 Document Revised: 12/30/2011 Document Reviewed: 03/08/2011 [facility] Patient Information 2014 [place].

## 2013-11-24 NOTE — ED Provider Notes (Signed)
Medical screening examination/treatment/procedure(s) were performed by non-physician practitioner and as supervising physician I was immediately available for consultation/collaboration.  EKG Interpretation   None       Devoria AlbeIva Jahaira Earnhart, MD, Armando GangFACEP   Ward GivensIva L Donika Butner, MD 11/24/13 1556

## 2013-11-24 NOTE — ED Notes (Signed)
Pt c/o generalized abd pain with n/v/d since Monday.

## 2013-11-26 LAB — URINE CULTURE: Colony Count: >=100000

## 2013-11-27 ENCOUNTER — Emergency Department (HOSPITAL_COMMUNITY): Payer: Medicaid Other

## 2013-11-27 ENCOUNTER — Telehealth (HOSPITAL_COMMUNITY): Payer: Self-pay | Admitting: Emergency Medicine

## 2013-11-27 ENCOUNTER — Emergency Department (HOSPITAL_COMMUNITY)
Admission: EM | Admit: 2013-11-27 | Discharge: 2013-11-27 | Disposition: A | Discharge status: Home / Self Care | Payer: Medicaid Other | Attending: Emergency Medicine | Admitting: Emergency Medicine

## 2013-11-27 ENCOUNTER — Encounter (HOSPITAL_COMMUNITY): Payer: Self-pay | Admitting: Emergency Medicine

## 2013-11-27 DIAGNOSIS — Z79899 Other long term (current) drug therapy: Secondary | ICD-10-CM | POA: Insufficient documentation

## 2013-11-27 DIAGNOSIS — N390 Urinary tract infection, site not specified: Secondary | ICD-10-CM

## 2013-11-27 DIAGNOSIS — R5383 Other fatigue: Secondary | ICD-10-CM

## 2013-11-27 DIAGNOSIS — F411 Generalized anxiety disorder: Secondary | ICD-10-CM | POA: Insufficient documentation

## 2013-11-27 DIAGNOSIS — R5381 Other malaise: Secondary | ICD-10-CM | POA: Insufficient documentation

## 2013-11-27 DIAGNOSIS — Z9851 Tubal ligation status: Secondary | ICD-10-CM | POA: Insufficient documentation

## 2013-11-27 DIAGNOSIS — Z9089 Acquired absence of other organs: Secondary | ICD-10-CM | POA: Insufficient documentation

## 2013-11-27 DIAGNOSIS — K5900 Constipation, unspecified: Secondary | ICD-10-CM | POA: Insufficient documentation

## 2013-11-27 DIAGNOSIS — F3289 Other specified depressive episodes: Secondary | ICD-10-CM | POA: Insufficient documentation

## 2013-11-27 DIAGNOSIS — F329 Major depressive disorder, single episode, unspecified: Secondary | ICD-10-CM | POA: Insufficient documentation

## 2013-11-27 LAB — CBC WITH DIFFERENTIAL/PLATELET
Basophils Absolute: 0 10*3/uL (ref 0.0–0.1)
Basophils Relative: 0 % (ref 0–1)
EOS PCT: 1 % (ref 0–5)
Eosinophils Absolute: 0.1 10*3/uL (ref 0.0–0.7)
HCT: 38.4 % (ref 36.0–46.0)
Hemoglobin: 12.7 g/dL (ref 12.0–15.0)
LYMPHS ABS: 2.5 10*3/uL (ref 0.7–4.0)
LYMPHS PCT: 24 % (ref 12–46)
MCH: 29.7 pg (ref 26.0–34.0)
MCHC: 33.1 g/dL (ref 30.0–36.0)
MCV: 89.7 fL (ref 78.0–100.0)
Monocytes Absolute: 0.9 10*3/uL (ref 0.1–1.0)
Monocytes Relative: 8 % (ref 3–12)
NEUTROS PCT: 67 % (ref 43–77)
Neutro Abs: 7.2 10*3/uL (ref 1.7–7.7)
PLATELETS: 322 10*3/uL (ref 150–400)
RBC: 4.28 MIL/uL (ref 3.87–5.11)
RDW: 15 % (ref 11.5–15.5)
WBC: 10.7 10*3/uL — AB (ref 4.0–10.5)

## 2013-11-27 LAB — URINE MICROSCOPIC-ADD ON

## 2013-11-27 LAB — COMPREHENSIVE METABOLIC PANEL
ALT: 23 U/L (ref 0–35)
AST: 21 U/L (ref 0–37)
Albumin: 3.7 g/dL (ref 3.5–5.2)
Alkaline Phosphatase: 100 U/L (ref 39–117)
BUN: 10 mg/dL (ref 6–23)
CALCIUM: 9.4 mg/dL (ref 8.4–10.5)
CO2: 26 meq/L (ref 19–32)
Chloride: 106 mEq/L (ref 96–112)
Creatinine, Ser: 0.88 mg/dL (ref 0.50–1.10)
GFR calc Af Amer: 87 mL/min — ABNORMAL LOW (ref >90)
GFR, EST NON AFRICAN AMERICAN: 75 mL/min — AB (ref >90)
Glucose, Bld: 113 mg/dL — ABNORMAL HIGH (ref 70–99)
Potassium: 3.3 mEq/L — ABNORMAL LOW (ref 3.7–5.3)
SODIUM: 145 meq/L (ref 137–147)
TOTAL PROTEIN: 8.5 g/dL — AB (ref 6.0–8.3)
Total Bilirubin: 0.4 mg/dL (ref 0.3–1.2)

## 2013-11-27 LAB — URINALYSIS, ROUTINE W REFLEX MICROSCOPIC
Bilirubin Urine: NEGATIVE
Glucose, UA: NEGATIVE mg/dL
KETONES UR: NEGATIVE mg/dL
Leukocytes, UA: NEGATIVE
Nitrite: NEGATIVE
PH: 8 (ref 5.0–8.0)
Protein, ur: 100 mg/dL — AB
SPECIFIC GRAVITY, URINE: 1.025 (ref 1.005–1.030)
Urobilinogen, UA: 1 mg/dL (ref 0.0–1.0)

## 2013-11-27 MED ORDER — SODIUM CHLORIDE 0.9 % IV SOLN
Freq: Once | INTRAVENOUS | Status: AC
  Administered 2013-11-27: 10:00:00 via INTRAVENOUS

## 2013-11-27 MED ORDER — KETOROLAC TROMETHAMINE 30 MG/ML IJ SOLN
30.0000 mg | Freq: Once | INTRAMUSCULAR | Status: AC
  Administered 2013-11-27: 30 mg via INTRAVENOUS
  Filled 2013-11-27: qty 1

## 2013-11-27 MED ORDER — ONDANSETRON HCL 4 MG/2ML IJ SOLN
4.0000 mg | Freq: Once | INTRAMUSCULAR | Status: AC
  Administered 2013-11-27: 4 mg via INTRAVENOUS
  Filled 2013-11-27: qty 2

## 2013-11-27 NOTE — Discharge Instructions (Signed)
Constipation, Adult  Constipation is when a person:  · Poops (bowel movement) less than 3 times a week.  · Has a hard time pooping.  · Has poop that is dry, hard, or bigger than normal.  HOME CARE   · Eat more fiber, such as fruits, vegetables, whole grains like brown rice, and beans.  · Eat less fatty foods and sugar. This includes French fries, hamburgers, cookies, candy, and soda.  · If you are not getting enough fiber from food, take products with added fiber in them (supplements).  · Drink enough fluid to keep your pee (urine) clear or pale yellow.  · Go to the restroom when you feel like you need to poop. Do not hold it.  · Only take medicine as told by your doctor. Do not take medicines that help you poop (laxatives) without talking to your doctor first.  · Exercise on a regular basis, or as told by your doctor.  GET HELP RIGHT AWAY IF:   · You have bright red blood in your poop (stool).  · Your constipation lasts more than 4 days or gets worse.  · You have belly (abdomen) or butt (rectal) pain.  · You have thin poop (as thin as a pencil).  · You lose weight, and it cannot be explained.  MAKE SURE YOU:   · Understand these instructions.  · Will watch your condition.  · Will get help right away if you are not doing well or get worse.  Document Released: 03/25/2008 Document Revised: 12/30/2011 Document Reviewed: 07/19/2013  [employer]® Patient Information ©2014 [employer].

## 2013-11-27 NOTE — ED Provider Notes (Signed)
CSN: [id number]     Arrival date & time 11/27/13  7829 History   First MD Initiated Contact with Patient 11/27/13 [phone]     Chief Complaint  Patient presents with  . Abdominal Cramping   (Consider location/radiation/quality/duration/timing/severity/associated sxs/prior Treatment) Patient is a 53 y.o. female presenting with cramps. The history is provided by the patient.  Abdominal Cramping This is a new problem. Episode onset: 3 days. The problem occurs constantly. The problem has been unchanged. Associated symptoms include abdominal pain, a change in bowel habit, fatigue, nausea and vomiting. Pertinent negatives include no chest pain, chills, congestion, coughing, diaphoresis, fever, headaches, numbness, rash, sore throat, swollen glands, urinary symptoms, vertigo, visual change or weakness. Nothing aggravates the symptoms. She has tried nothing for the symptoms. The treatment provided no relief.   patient c/o crampy abdominal pain with nausea and intermittent vomiting for 3 days.  States the symptoms were present on her previous ED visit, and do not seem to be improving.  She states she is taking an antibiotic for a UTI and was having urinary frequency which seem to be improving.  She also c/o constipation and abdominal bloating. She denies fever, diarrhea, hematochezia, chest pain or flank pain.     Past Medical History  Diagnosis Date  . Depression   . Anxiety   . Hypertension    Past Surgical History  Procedure Laterality Date  . Cholecystectomy    . Tubal ligation     No family history on file. History  Substance Use Topics  . Smoking status: Never Smoker   . Smokeless tobacco: Not on file  . Alcohol Use: Yes     Comment: occasionally monthly   OB History   Grav Para Term Preterm Abortions TAB SAB Ect Mult Living                 Review of Systems  Constitutional: Positive for fatigue. Negative for fever, chills, diaphoresis and appetite change.  HENT: Negative for  congestion and sore throat.   Respiratory: Negative for cough and shortness of breath.   Cardiovascular: Negative for chest pain.  Gastrointestinal: Positive for nausea, vomiting, abdominal pain, constipation and change in bowel habit. Negative for diarrhea, blood in stool and abdominal distention.  Genitourinary: Negative for dysuria, urgency, hematuria, flank pain, decreased urine volume, vaginal bleeding, vaginal discharge and difficulty urinating.  Musculoskeletal: Negative for back pain.  Skin: Negative for color change and rash.  Neurological: Negative for dizziness, vertigo, weakness, numbness and headaches.  Hematological: Negative for adenopathy.  All other systems reviewed and are negative.    Allergies  Review of patient's allergies indicates no known allergies.  Home Medications   Current Outpatient Rx  Name  Route  Sig  Dispense  Refill  . cephALEXin (KEFLEX) 500 MG capsule   Oral   Take 1,000 mg by mouth 2 (two) times daily.         . citalopram (CELEXA) 40 MG tablet   Oral   Take 40 mg by mouth daily.         . hydrochlorothiazide (HYDRODIURIL) 25 MG tablet   Oral   Take 12.5 mg by mouth daily.         Marland Kitchen HYDROcodone-acetaminophen (NORCO/VICODIN) 5-325 MG per tablet   Oral   Take 1-2 tablets by mouth every 6 (six) hours as needed.   12 tablet   0   . omeprazole (PRILOSEC) 20 MG capsule   Oral   Take 20 mg by mouth daily.         Marland Kitchen  promethazine (PHENERGAN) 25 MG tablet   Oral   Take 1 tablet (25 mg total) by mouth every 6 (six) hours as needed for nausea or vomiting.   12 tablet   0    BP 145/75  Pulse 107  Temp(Src) 99.6 F (37.6 C) (Oral)  Resp 16  Ht 6\' 1"  (1.854 m)  Wt 245 lb (111.131 kg)  BMI 32.33 kg/m2  SpO2 99% Physical Exam  Nursing note and vitals reviewed. Constitutional: She is oriented to person, place, and time. She appears well-developed and well-nourished. No distress.  HENT:  Head: Normocephalic and atraumatic.   Mouth/Throat: Oropharynx is clear and moist.  Neck: Normal range of motion. Neck supple.  Cardiovascular: Normal rate, regular rhythm, normal heart sounds and intact distal pulses.   No murmur heard. Pulmonary/Chest: Effort normal and breath sounds normal. No respiratory distress. She has no wheezes. She has no rales. She exhibits no tenderness.  Abdominal: Soft. Normal appearance and bowel sounds are normal. She exhibits no distension and no mass. There is no hepatosplenomegaly. There is generalized tenderness. There is no rigidity, no rebound, no guarding, no CVA tenderness and no tenderness at McBurney's point.  Musculoskeletal: Normal range of motion. She exhibits no edema.  Lymphadenopathy:    She has no cervical adenopathy.  Neurological: She is alert and oriented to person, place, and time. She exhibits normal muscle tone. Coordination normal.  Skin: Skin is warm and dry.    ED Course  Procedures (including critical care time) Labs Review Labs Reviewed  CBC WITH DIFFERENTIAL - Abnormal; Notable for the following:    WBC 10.7 (*)    All other components within normal limits  COMPREHENSIVE METABOLIC PANEL - Abnormal; Notable for the following:    Potassium 3.3 (*)    Glucose, Bld 113 (*)    Total Protein 8.5 (*)    GFR calc non Af Amer 75 (*)    GFR calc Af Amer 87 (*)    All other components within normal limits  URINALYSIS, ROUTINE W REFLEX MICROSCOPIC - Abnormal; Notable for the following:    Hgb urine dipstick TRACE (*)    Protein, ur 100 (*)    All other components within normal limits  URINE MICROSCOPIC-ADD ON - Abnormal; Notable for the following:    Squamous Epithelial / LPF MANY (*)    Bacteria, UA FEW (*)    All other components within normal limits   Imaging Review Dg Abd Acute W/chest  11/27/2013   CLINICAL DATA:  Central abdominal pain and cramping for the past 3 days. Previous cholecystectomy.  EXAM: ACUTE ABDOMEN SERIES (ABDOMEN 2 VIEW & CHEST 1 VIEW)   COMPARISON:  09/10/2013.  FINDINGS: The heart remains normal in size and the lungs are clear. Normal bowel gas pattern without free peritoneal air. Mildly prominent stool. Cholecystectomy clips and additional surgical clips in the mid to lower abdomen bilaterally. Lumbar and thoracic spine degenerative changes.  IMPRESSION: Mildly prominent stool.  No acute abnormality.   Electronically Signed   By: Gordan Payment M.D.   On: 11/27/2013 11:34    EKG Interpretation   None       MDM    Urine culture from 11/24/13 shows Klebsiella patient currently taking Keflex, culture shows sensitivity .    Patient is feeling better. Appears stable for discharge. She agrees to continue Keflex as directed. Also advised her to stay well hydrated and to take over-the-counter MiraLax as directed for constipation. No concerning symptoms for acute abdomen  or pyelonephritis at this time.  patient agrees to f/u with her PMD.    Kristene Liberati L. Trisha Mangleriplett, PA-C 11/28/13 2156

## 2013-11-27 NOTE — ED Notes (Signed)
Post ED Visit - Positive Culture Follow-up  Culture report reviewed by antimicrobial stewardship pharmacist: []  Wes Dulaney, Pharm.D., BCPS [x]  Celedonio MiyamotoJeremy Frens, Pharm.D., BCPS []  Georgina PillionElizabeth Martin, [address]Ph[address]., BCPS []  St. Mary'sMinh Pham, [address]Ph[address]D., BCPS, AAHIVP []  Estella HuskMichelle Turner, Pharm.D., BCPS, AAHIVP  Positive urine culture Treated with Keflex, organism sensitive to the same and no further patient follow-up is required at this time.  Zeb ComfortHolland, Leocadio Heal 11/27/2013, 9:29 AM

## 2013-11-27 NOTE — ED Notes (Signed)
Pt reports generalized abdominal pain since Wednesday. Pt reports decreased appetite and intermittent nausea but denies v/d. Describes pain as aching.

## 2013-11-27 NOTE — ED Notes (Signed)
Patient with c/o diffuse abdominal cramping, seen for same 11/24/13. LBM 11/24/13. States vomiting.

## 2013-11-30 NOTE — ED Provider Notes (Signed)
Medical screening examination/treatment/procedure(s) were performed by non-physician practitioner and as supervising physician I was immediately available for consultation/collaboration.  EKG Interpretation   None         Stefen Juba M Areta Terwilliger, DO 11/30/13 1138 

## 2013-12-09 ENCOUNTER — Encounter (HOSPITAL_COMMUNITY): Payer: Medicaid Other

## 2013-12-17 ENCOUNTER — Other Ambulatory Visit (HOSPITAL_COMMUNITY): Payer: Self-pay | Admitting: Family Medicine

## 2013-12-17 ENCOUNTER — Ambulatory Visit (HOSPITAL_COMMUNITY)
Admission: RE | Admit: 2013-12-17 | Discharge: 2013-12-17 | Disposition: A | Discharge status: Home / Self Care | Payer: Medicaid Other | Source: Ambulatory Visit | Attending: Family Medicine | Admitting: Family Medicine

## 2013-12-17 DIAGNOSIS — Z1231 Encounter for screening mammogram for malignant neoplasm of breast: Secondary | ICD-10-CM

## 2013-12-21 ENCOUNTER — Other Ambulatory Visit: Payer: Self-pay | Admitting: Family Medicine

## 2013-12-21 DIAGNOSIS — R928 Other abnormal and inconclusive findings on diagnostic imaging of breast: Secondary | ICD-10-CM

## 2014-01-05 ENCOUNTER — Other Ambulatory Visit: Payer: Self-pay | Admitting: Family Medicine

## 2014-01-05 ENCOUNTER — Ambulatory Visit (HOSPITAL_COMMUNITY)
Admission: RE | Admit: 2014-01-05 | Discharge: 2014-01-05 | Disposition: A | Discharge status: Home / Self Care | Payer: Medicaid Other | Source: Ambulatory Visit | Attending: Family Medicine | Admitting: Family Medicine

## 2014-01-05 DIAGNOSIS — R928 Other abnormal and inconclusive findings on diagnostic imaging of breast: Secondary | ICD-10-CM

## 2014-01-05 DIAGNOSIS — N63 Unspecified lump in unspecified breast: Secondary | ICD-10-CM | POA: Insufficient documentation

## 2014-05-06 ENCOUNTER — Emergency Department (HOSPITAL_COMMUNITY)
Admission: EM | Admit: 2014-05-06 | Discharge: 2014-05-06 | Disposition: A | Discharge status: Home / Self Care | Payer: MEDICAID | Attending: Emergency Medicine | Admitting: Emergency Medicine

## 2014-05-06 ENCOUNTER — Encounter (HOSPITAL_COMMUNITY): Payer: Self-pay | Admitting: Emergency Medicine

## 2014-05-06 DIAGNOSIS — Z79899 Other long term (current) drug therapy: Secondary | ICD-10-CM | POA: Insufficient documentation

## 2014-05-06 DIAGNOSIS — F3289 Other specified depressive episodes: Secondary | ICD-10-CM | POA: Diagnosis not present

## 2014-05-06 DIAGNOSIS — Z791 Long term (current) use of non-steroidal anti-inflammatories (NSAID): Secondary | ICD-10-CM | POA: Diagnosis not present

## 2014-05-06 DIAGNOSIS — F329 Major depressive disorder, single episode, unspecified: Secondary | ICD-10-CM | POA: Diagnosis not present

## 2014-05-06 DIAGNOSIS — F411 Generalized anxiety disorder: Secondary | ICD-10-CM | POA: Diagnosis not present

## 2014-05-06 DIAGNOSIS — I10 Essential (primary) hypertension: Secondary | ICD-10-CM | POA: Insufficient documentation

## 2014-05-06 DIAGNOSIS — F32A Depression, unspecified: Secondary | ICD-10-CM

## 2014-05-06 LAB — RAPID URINE DRUG SCREEN, HOSP PERFORMED
Amphetamines: NOT DETECTED
BARBITURATES: NOT DETECTED
BENZODIAZEPINES: NOT DETECTED
COCAINE: NOT DETECTED
OPIATES: NOT DETECTED
TETRAHYDROCANNABINOL: POSITIVE — AB

## 2014-05-06 LAB — CBC WITH DIFFERENTIAL/PLATELET
BASOS PCT: 1 % (ref 0–1)
Basophils Absolute: 0.1 10*3/uL (ref 0.0–0.1)
Eosinophils Absolute: 0.2 10*3/uL (ref 0.0–0.7)
Eosinophils Relative: 2 % (ref 0–5)
HEMATOCRIT: 37.7 % (ref 36.0–46.0)
Hemoglobin: 12.6 g/dL (ref 12.0–15.0)
Lymphocytes Relative: 30 % (ref 12–46)
Lymphs Abs: 2.5 10*3/uL (ref 0.7–4.0)
MCH: 29.3 pg (ref 26.0–34.0)
MCHC: 33.4 g/dL (ref 30.0–36.0)
MCV: 87.7 fL (ref 78.0–100.0)
Monocytes Absolute: 0.7 10*3/uL (ref 0.1–1.0)
Monocytes Relative: 8 % (ref 3–12)
NEUTROS ABS: 5 10*3/uL (ref 1.7–7.7)
NEUTROS PCT: 59 % (ref 43–77)
Platelets: 313 10*3/uL (ref 150–400)
RBC: 4.3 MIL/uL (ref 3.87–5.11)
RDW: 15.6 % — ABNORMAL HIGH (ref 11.5–15.5)
WBC: 8.4 10*3/uL (ref 4.0–10.5)

## 2014-05-06 LAB — BASIC METABOLIC PANEL
ANION GAP: 10 (ref 5–15)
BUN: 15 mg/dL (ref 6–23)
CHLORIDE: 105 meq/L (ref 96–112)
CO2: 27 meq/L (ref 19–32)
Calcium: 9.4 mg/dL (ref 8.4–10.5)
Creatinine, Ser: 0.92 mg/dL (ref 0.50–1.10)
GFR calc non Af Amer: 71 mL/min — ABNORMAL LOW (ref >90)
GFR, EST AFRICAN AMERICAN: 82 mL/min — AB (ref >90)
Glucose, Bld: 155 mg/dL — ABNORMAL HIGH (ref 70–99)
POTASSIUM: 3.6 meq/L — AB (ref 3.7–5.3)
Sodium: 142 mEq/L (ref 137–147)

## 2014-05-06 LAB — ETHANOL: Alcohol, Ethyl (B): <11 mg/dL (ref 0–11)

## 2014-05-06 NOTE — ED Provider Notes (Signed)
CSN: [id number]     Arrival date & time 05/06/14  1519 History   First MD Initiated Contact with Patient 05/06/14 1529   This chart was scribed for Benny Lennert, MD by Gwenevere Abbot, ED scribe. This patient was seen in room [facility] and the patient's care was started at 4:15 PM.    Chief Complaint  Patient presents with  . Depression   Patient is a 53 y.o. female presenting with altered mental status. The history is provided by the patient (the pt is depressed not suicidal or homicidal). No language interpreter was used.  Altered Mental Status Presenting symptoms: behavior changes   Severity:  Severe Most recent episode:  More than 2 days ago Episode history:  Continuous Timing:  Constant Progression:  Worsening Chronicity:  Recurrent Context: not alcohol use   Associated symptoms: no abdominal pain, no hallucinations, no headaches, no rash and no seizures    HPI Comments:  Janet Mcguire is a 53 y.o. female who presents to the Emergency Department complaining of depression   Past Medical History  Diagnosis Date  . Depression   . Anxiety   . Hypertension    Past Surgical History  Procedure Laterality Date  . Cholecystectomy    . Tubal ligation     History reviewed. No pertinent family history. History  Substance Use Topics  . Smoking status: Never Smoker   . Smokeless tobacco: Not on file  . Alcohol Use: Yes     Comment: occasionally monthly   OB History   Grav Para Term Preterm Abortions TAB SAB Ect Mult Living                 Review of Systems  Constitutional: Negative for appetite change and fatigue.  HENT: Negative for congestion, ear discharge and sinus pressure.   Eyes: Negative for discharge.  Respiratory: Negative for cough.   Cardiovascular: Negative for chest pain.  Gastrointestinal: Negative for abdominal pain and diarrhea.  Genitourinary: Negative for frequency and hematuria.  Musculoskeletal: Negative for back pain.  Skin: Negative for rash.   Neurological: Negative for seizures and headaches.  Psychiatric/Behavioral: Positive for dysphoric mood. Negative for hallucinations.      Allergies  Review of patient's allergies indicates no known allergies.  Home Medications   Prior to Admission medications   Medication Sig Start Date End Date Taking? Authorizing Provider  citalopram (CELEXA) 40 MG tablet Take 40 mg by mouth daily.   Yes Historical Provider, MD  hydrochlorothiazide (HYDRODIURIL) 25 MG tablet Take 12.5 mg by mouth daily.   Yes Historical Provider, MD  ibuprofen (ADVIL,MOTRIN) 200 MG tablet Take 400 mg by mouth daily as needed for moderate pain.   Yes Historical Provider, MD  omeprazole (PRILOSEC) 20 MG capsule Take 20 mg by mouth daily.   Yes Historical Provider, MD   BP 144/89  Pulse 91  Temp(Src) 99.2 F (37.3 C) (Oral)  Resp 16  Ht 6\' 1"  (1.854 m)  Wt 251 lb (113.853 kg)  BMI 33.12 kg/m2  SpO2 100% Physical Exam  Constitutional: She is oriented to person, place, and time. She appears well-developed.  HENT:  Head: Normocephalic.  Eyes: Conjunctivae and EOM are normal. No scleral icterus.  Neck: Neck supple. No thyromegaly present.  Cardiovascular: Normal rate and regular rhythm.  Exam reveals no gallop and no friction rub.   No murmur heard. Pulmonary/Chest: No stridor. She has no wheezes. She has no rales. She exhibits no tenderness.  Abdominal: She exhibits no distension. There  is no tenderness. There is no rebound.  Musculoskeletal: Normal range of motion. She exhibits no edema.  Lymphadenopathy:    She has no cervical adenopathy.  Neurological: She is oriented to person, place, and time. She exhibits normal muscle tone. Coordination normal.  Skin: No rash noted. No erythema.  Psychiatric:  Depressed not suicidal or homicidal    ED Course  Procedures  DIAGNOSTIC STUDIES: Oxygen Saturation is 100% on Ra, normal by my interpretation.  COORDINATION OF CARE: 4:17 PM-Discussed treatment plan  with pt at bedside and pt agreed to plan. Results for orders placed during the hospital encounter of 05/06/14  CBC WITH DIFFERENTIAL      Result Value Ref Range   WBC 8.4  4.0 - 10.5 K/uL   RBC 4.30  3.87 - 5.11 MIL/uL   Hemoglobin 12.6  12.0 - 15.0 g/dL   HCT 21.337.7  08.636.0 - 57.846.0 %   MCV 87.7  78.0 - 100.0 fL   MCH 29.3  26.0 - 34.0 pg   MCHC 33.4  30.0 - 36.0 g/dL   RDW 46.915.6 (*) 62.911.5 - 52.815.5 %   Platelets 313  150 - 400 K/uL   Neutrophils Relative % 59  43 - 77 %   Neutro Abs 5.0  1.7 - 7.7 K/uL   Lymphocytes Relative 30  12 - 46 %   Lymphs Abs 2.5  0.7 - 4.0 K/uL   Monocytes Relative 8  3 - 12 %   Monocytes Absolute 0.7  0.1 - 1.0 K/uL   Eosinophils Relative 2  0 - 5 %   Eosinophils Absolute 0.2  0.0 - 0.7 K/uL   Basophils Relative 1  0 - 1 %   Basophils Absolute 0.1  0.0 - 0.1 K/uL  BASIC METABOLIC PANEL      Result Value Ref Range   Sodium 142  137 - 147 mEq/L   Potassium 3.6 (*) 3.7 - 5.3 mEq/L   Chloride 105  96 - 112 mEq/L   CO2 27  19 - 32 mEq/L   Glucose, Bld 155 (*) 70 - 99 mg/dL   BUN 15  6 - 23 mg/dL   Creatinine, Ser 4.130.92  0.50 - 1.10 mg/dL   Calcium 9.4  8.4 - 24.410.5 mg/dL   GFR calc non Af Amer 71 (*) >90 mL/min   GFR calc Af Amer 82 (*) >90 mL/min   Anion gap 10  5 - 15  ETHANOL      Result Value Ref Range   Alcohol, Ethyl (B) <11  0 - 11 mg/dL     Labs Review Labs Reviewed  CBC WITH DIFFERENTIAL - Abnormal; Notable for the following:    RDW 15.6 (*)    All other components within normal limits  BASIC METABOLIC PANEL  ETHANOL  URINE RAPID DRUG SCREEN (HOSP PERFORMED)     EKG Interpretation None      MDM   Final diagnoses:  None    Seen by psyc and out pt tx rec  The chart was scribed for me under my direct supervision.  I personally performed the history, physical, and medical decision making and all procedures in the evaluation of this patient.Benny Lennert.      Giuseppe Duchemin L Aleaha Fickling, MD 05/06/14 [phone]
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
## 2014-05-06 NOTE — ED Notes (Signed)
Telepsych underway.
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
## 2014-05-06 NOTE — ED Notes (Signed)
Pt states she is depressed because she is having a whole lot of problems in her home. Pt denies SI/HI, tearful at triage.

## 2014-05-06 NOTE — Discharge Instructions (Signed)
Follow up with your md next week. °

## 2014-05-06 NOTE — ED Notes (Signed)
Patient with no complaints at this time. Respirations even and unlabored. Skin warm/dry. Discharge instructions reviewed with patient at this time. Patient given opportunity to voice concerns/ask questions. Patient discharged at this time and left Emergency Department with steady gait.   

## 2014-05-06 NOTE — ED Notes (Signed)
Dr. Estell HarpinZammit made aware of patient request to go home.

## 2014-05-06 NOTE — BH Assessment (Signed)
[facility] Assessment Progress Note  [facility] EDP Zammit to gather clinical information for the pt, as a tele assessment was ordered.  Pt's tele assessment scheduled with this clinician for 1625.  Casimer LaniusKristen Chanoch Mccleery, MS, [facility] Licensed Professional Counselor Triage Specialist

## 2014-05-06 NOTE — ED Notes (Signed)
Patient states she wants to go home.  States "I'll be okay if I just go home and lay down."  Encouraged her to stay and have telepsych eval.  She state, "I just don't want to be sent away."

## 2014-06-08 ENCOUNTER — Emergency Department (HOSPITAL_COMMUNITY)
Admission: EM | Admit: 2014-06-08 | Discharge: 2014-06-08 | Disposition: A | Discharge status: Home / Self Care | Payer: Medicaid Other | Attending: Emergency Medicine | Admitting: Emergency Medicine

## 2014-06-08 ENCOUNTER — Encounter (HOSPITAL_COMMUNITY): Payer: Self-pay | Admitting: Emergency Medicine

## 2014-06-08 DIAGNOSIS — R0602 Shortness of breath: Secondary | ICD-10-CM | POA: Insufficient documentation

## 2014-06-08 DIAGNOSIS — Z3202 Encounter for pregnancy test, result negative: Secondary | ICD-10-CM | POA: Insufficient documentation

## 2014-06-08 DIAGNOSIS — F329 Major depressive disorder, single episode, unspecified: Secondary | ICD-10-CM | POA: Diagnosis not present

## 2014-06-08 DIAGNOSIS — I10 Essential (primary) hypertension: Secondary | ICD-10-CM | POA: Diagnosis not present

## 2014-06-08 DIAGNOSIS — R109 Unspecified abdominal pain: Secondary | ICD-10-CM

## 2014-06-08 DIAGNOSIS — Z79899 Other long term (current) drug therapy: Secondary | ICD-10-CM | POA: Insufficient documentation

## 2014-06-08 DIAGNOSIS — F3289 Other specified depressive episodes: Secondary | ICD-10-CM | POA: Diagnosis not present

## 2014-06-08 DIAGNOSIS — F411 Generalized anxiety disorder: Secondary | ICD-10-CM | POA: Diagnosis not present

## 2014-06-08 LAB — URINALYSIS, ROUTINE W REFLEX MICROSCOPIC
Bilirubin Urine: NEGATIVE
GLUCOSE, UA: NEGATIVE mg/dL
KETONES UR: NEGATIVE mg/dL
LEUKOCYTES UA: NEGATIVE
NITRITE: NEGATIVE
PH: 6.5 (ref 5.0–8.0)
Protein, ur: NEGATIVE mg/dL
SPECIFIC GRAVITY, URINE: 1.01 (ref 1.005–1.030)
Urobilinogen, UA: 0.2 mg/dL (ref 0.0–1.0)

## 2014-06-08 LAB — URINE MICROSCOPIC-ADD ON

## 2014-06-08 LAB — COMPREHENSIVE METABOLIC PANEL
ALBUMIN: 3.4 g/dL — AB (ref 3.5–5.2)
ALT: 16 U/L (ref 0–35)
AST: 17 U/L (ref 0–37)
Alkaline Phosphatase: 82 U/L (ref 39–117)
Anion gap: 9 (ref 5–15)
BILIRUBIN TOTAL: 0.3 mg/dL (ref 0.3–1.2)
BUN: 11 mg/dL (ref 6–23)
CALCIUM: 9.2 mg/dL (ref 8.4–10.5)
CO2: 26 mEq/L (ref 19–32)
CREATININE: 0.84 mg/dL (ref 0.50–1.10)
Chloride: 107 mEq/L (ref 96–112)
GFR calc Af Amer: >90 mL/min (ref >90)
GFR calc non Af Amer: 79 mL/min — ABNORMAL LOW (ref >90)
Glucose, Bld: 118 mg/dL — ABNORMAL HIGH (ref 70–99)
Potassium: 3.8 mEq/L (ref 3.7–5.3)
Sodium: 142 mEq/L (ref 137–147)
TOTAL PROTEIN: 7.3 g/dL (ref 6.0–8.3)

## 2014-06-08 LAB — CBC WITH DIFFERENTIAL/PLATELET
BASOS ABS: 0.1 10*3/uL (ref 0.0–0.1)
BASOS PCT: 1 % (ref 0–1)
Eosinophils Absolute: 0.2 10*3/uL (ref 0.0–0.7)
Eosinophils Relative: 4 % (ref 0–5)
HEMATOCRIT: 35.7 % — AB (ref 36.0–46.0)
Hemoglobin: 12 g/dL (ref 12.0–15.0)
Lymphocytes Relative: 36 % (ref 12–46)
Lymphs Abs: 2.5 10*3/uL (ref 0.7–4.0)
MCH: 29.4 pg (ref 26.0–34.0)
MCHC: 33.6 g/dL (ref 30.0–36.0)
MCV: 87.5 fL (ref 78.0–100.0)
Monocytes Absolute: 0.7 10*3/uL (ref 0.1–1.0)
Monocytes Relative: 10 % (ref 3–12)
Neutro Abs: 3.5 10*3/uL (ref 1.7–7.7)
Neutrophils Relative %: 49 % (ref 43–77)
Platelets: 297 10*3/uL (ref 150–400)
RBC: 4.08 MIL/uL (ref 3.87–5.11)
RDW: 15.5 % (ref 11.5–15.5)
WBC: 6.9 10*3/uL (ref 4.0–10.5)

## 2014-06-08 LAB — PREGNANCY, URINE: PREG TEST UR: NEGATIVE

## 2014-06-08 LAB — LIPASE, BLOOD: Lipase: 30 U/L (ref 11–59)

## 2014-06-08 MED ORDER — MORPHINE SULFATE 4 MG/ML IJ SOLN
4.0000 mg | Freq: Once | INTRAMUSCULAR | Status: AC
  Administered 2014-06-08: 4 mg via INTRAVENOUS
  Filled 2014-06-08: qty 1

## 2014-06-08 MED ORDER — ONDANSETRON HCL 4 MG/2ML IJ SOLN
4.0000 mg | Freq: Once | INTRAMUSCULAR | Status: AC
  Administered 2014-06-08: 4 mg via INTRAVENOUS
  Filled 2014-06-08: qty 2

## 2014-06-08 NOTE — ED Provider Notes (Signed)
CSN: [id number]     Arrival date & time 06/08/14  [id number] History   First MD Initiated Contact with Patient 06/08/14 0355     Chief Complaint  Patient presents with  . Flank Pain      Patient is a 53 y.o. female presenting with flank pain. The history is provided by the patient.  Flank Pain This is a recurrent problem. The current episode started 1 to 2 hours ago. The problem occurs constantly. The problem has been gradually worsening. Associated symptoms include abdominal pain and shortness of breath. Exacerbated by: palpation. The symptoms are relieved by rest.  pt presents with left flank pain and nausea for past 2 hours She report SOB due to pain No fever No diarrhea No dysuria is reported She reports h/o kidney stones in the past and there is some similarity to this pain No active CP at this time  Past Medical History  Diagnosis Date  . Depression   . Anxiety   . Hypertension    Past Surgical History  Procedure Laterality Date  . Cholecystectomy    . Tubal ligation     No family history on file. History  Substance Use Topics  . Smoking status: Never Smoker   . Smokeless tobacco: Not on file  . Alcohol Use: No   OB History   Grav Para Term Preterm Abortions TAB SAB Ect Mult Living                 Review of Systems  Constitutional: Negative for fever.  Respiratory: Positive for shortness of breath.        SOB due to flank pain   Gastrointestinal: Positive for abdominal pain. Negative for vomiting.  Genitourinary: Positive for flank pain. Negative for dysuria.  Neurological: Negative for weakness.  All other systems reviewed and are negative.     Allergies  Ibuprofen  Home Medications   Prior to Admission medications   Medication Sig Start Date End Date Taking? Authorizing Provider  citalopram (CELEXA) 40 MG tablet Take 40 mg by mouth daily.   Yes Historical Provider, MD  hydrochlorothiazide (HYDRODIURIL) 25 MG tablet Take 12.5 mg by mouth daily.   Yes  Historical Provider, MD  omeprazole (PRILOSEC) 20 MG capsule Take 20 mg by mouth daily.   Yes Historical Provider, MD   BP 123/63  Pulse 78  Temp(Src) 98.1 F (36.7 C) (Oral)  Resp 20  Ht 6\' 1"  (1.854 m)  Wt 246 lb (111.585 kg)  BMI 32.46 kg/m2  SpO2 98% Physical Exam CONSTITUTIONAL: Well developed/well nourished HEAD: Normocephalic/atraumatic EYES: EOMI/PERRL ENMT: Mucous membranes moist NECK: supple no meningeal signs SPINE:entire spine nontender CV: S1/S2 noted, no murmurs/rubs/gallops noted LUNGS: Lungs are clear to auscultation bilaterally, no apparent distress ABDOMEN: soft, nontender, no rebound or guarding JX:[id number]:left cva tenderness NEURO: Pt is awake/alert, moves all extremitiesx4 EXTREMITIES: pulses normal, full ROM SKIN: warm, color normal PSYCH: no abnormalities of mood noted  ED Course  Procedures Labs Review Labs Reviewed  CBC WITH DIFFERENTIAL - Abnormal; Notable for the following:    HCT 35.7 (*)    All other components within normal limits  COMPREHENSIVE METABOLIC PANEL - Abnormal; Notable for the following:    Glucose, Bld 118 (*)    Albumin 3.4 (*)    GFR calc non Af Amer 79 (*)    All other components within normal limits  URINALYSIS, ROUTINE W REFLEX MICROSCOPIC - Abnormal; Notable for the following:    Color, Urine STRAW (*)  Hgb urine dipstick TRACE (*)    All other components within normal limits  URINE MICROSCOPIC-ADD ON - Abnormal; Notable for the following:    Squamous Epithelial / LPF FEW (*)    Bacteria, UA FEW (*)    All other components within normal limits  PREGNANCY, URINE  LIPASE, BLOOD   Labs unremarkable Pt sleeping during most of ED stay She is in no distress She reports her pain is improved No focal abd tenderness noted I doubt acute abdominal emergency at this time She denies any active CP to suggest ACS/PE     MDM   Final diagnoses:  Left flank pain    Nursing notes including past medical history and social  history reviewed and considered in documentation Labs/vital reviewed and considered     Joya Gaskins, MD 06/08/14 [phone]

## 2014-06-08 NOTE — Discharge Instructions (Signed)

## 2014-06-08 NOTE — ED Notes (Signed)
Flank pain on the left, nausea,

## 2014-06-09 ENCOUNTER — Other Ambulatory Visit (HOSPITAL_COMMUNITY): Payer: Self-pay | Admitting: Family Medicine

## 2014-06-09 DIAGNOSIS — N6002 Solitary cyst of left breast: Secondary | ICD-10-CM

## 2014-06-09 DIAGNOSIS — N6001 Solitary cyst of right breast: Secondary | ICD-10-CM

## 2014-07-12 ENCOUNTER — Ambulatory Visit (HOSPITAL_COMMUNITY): Payer: Medicaid Other

## 2014-07-12 ENCOUNTER — Ambulatory Visit (HOSPITAL_COMMUNITY): Admission: RE | Admit: 2014-07-12 | Payer: Medicaid Other | Source: Ambulatory Visit

## 2014-07-19 ENCOUNTER — Ambulatory Visit (HOSPITAL_COMMUNITY)
Admission: RE | Admit: 2014-07-19 | Discharge: 2014-07-19 | Disposition: A | Discharge status: Home / Self Care | Payer: Medicaid Other | Source: Ambulatory Visit | Attending: Family Medicine | Admitting: Family Medicine

## 2014-07-19 ENCOUNTER — Other Ambulatory Visit (HOSPITAL_COMMUNITY): Payer: Medicaid Other

## 2014-07-19 DIAGNOSIS — N6001 Solitary cyst of right breast: Secondary | ICD-10-CM

## 2014-08-23 ENCOUNTER — Encounter (HOSPITAL_COMMUNITY): Payer: Self-pay | Admitting: *Deleted

## 2014-08-23 ENCOUNTER — Emergency Department (HOSPITAL_COMMUNITY)
Admission: EM | Admit: 2014-08-23 | Discharge: 2014-08-23 | Disposition: A | Discharge status: Home / Self Care | Payer: Medicaid Other | Attending: Emergency Medicine | Admitting: Emergency Medicine

## 2014-08-23 DIAGNOSIS — L089 Local infection of the skin and subcutaneous tissue, unspecified: Secondary | ICD-10-CM | POA: Diagnosis not present

## 2014-08-23 DIAGNOSIS — Z79899 Other long term (current) drug therapy: Secondary | ICD-10-CM | POA: Diagnosis not present

## 2014-08-23 DIAGNOSIS — F329 Major depressive disorder, single episode, unspecified: Secondary | ICD-10-CM | POA: Insufficient documentation

## 2014-08-23 DIAGNOSIS — I10 Essential (primary) hypertension: Secondary | ICD-10-CM | POA: Diagnosis not present

## 2014-08-23 DIAGNOSIS — F419 Anxiety disorder, unspecified: Secondary | ICD-10-CM | POA: Insufficient documentation

## 2014-08-23 DIAGNOSIS — H6002 Abscess of left external ear: Secondary | ICD-10-CM | POA: Diagnosis present

## 2014-08-23 LAB — CBG MONITORING, ED: Glucose-Capillary: 98 mg/dL (ref 70–99)

## 2014-08-23 MED ORDER — SULFAMETHOXAZOLE-TRIMETHOPRIM 800-160 MG PO TABS
1.0000 | ORAL_TABLET | Freq: Once | ORAL | Status: AC
  Administered 2014-08-23: 1 via ORAL
  Filled 2014-08-23: qty 1

## 2014-08-23 MED ORDER — SULFAMETHOXAZOLE-TRIMETHOPRIM 800-160 MG PO TABS: 1.0000 | ORAL_TABLET | Freq: Two times a day (BID) | ORAL | Status: AC

## 2014-08-23 NOTE — Discharge Instructions (Signed)
Please cleanse the wound beside your year with soap and water, then apply Neosporin ointment daily. Please use Septra one tablet 2 times daily with food. Please see Dr. Loleta ChanceHill, or return to the emergency department if any red streaks, high fever, or signs of advancing  infection.

## 2014-08-23 NOTE — ED Provider Notes (Signed)
CSN: [id number]     Arrival date & time 08/23/14  1224 History   First MD Initiated Contact with Patient 08/23/14 1400     Chief Complaint  Patient presents with  . Abscess     (Consider location/radiation/quality/duration/timing/severity/associated sxs/prior Treatment) Patient is a 53 y.o. female presenting with abscess. The history is provided by the patient.  Abscess Location:  Face and head/neck Head/neck abscess location:  L ear Abscess quality: painful, redness and warmth   Red streaking: no   Duration:  4 days Progression:  Worsening Pain details:    Quality:  Sharp   Severity:  Moderate   Duration:  4 days   Timing:  Intermittent   Progression:  Worsening Chronicity:  New Context: skin injury   Context: not diabetes   Relieved by:  Nothing Worsened by:  Nothing tried Ineffective treatments:  Topical antibiotics Associated symptoms: no fever, no nausea and no vomiting   Risk factors: no prior abscess     Past Medical History  Diagnosis Date  . Depression   . Anxiety   . Hypertension    Past Surgical History  Procedure Laterality Date  . Cholecystectomy    . Tubal ligation     Family History  Problem Relation Age of Onset  . Diabetes Mother    History  Substance Use Topics  . Smoking status: Never Smoker   . Smokeless tobacco: Not on file  . Alcohol Use: No   OB History    No data available     Review of Systems  Constitutional: Negative for fever and activity change.       All ROS Neg except as noted in HPI  Eyes: Negative for photophobia and discharge.  Respiratory: Negative for cough, shortness of breath and wheezing.   Cardiovascular: Negative for chest pain and palpitations.  Gastrointestinal: Negative for nausea, vomiting, abdominal pain and blood in stool.  Genitourinary: Negative for dysuria, frequency and hematuria.  Musculoskeletal: Negative for back pain, arthralgias and neck pain.  Skin: Positive for wound.  Neurological: Negative  for dizziness, seizures and speech difficulty.  Psychiatric/Behavioral: Negative for hallucinations and confusion. The patient is nervous/anxious.       Allergies  Ibuprofen  Home Medications   Prior to Admission medications   Medication Sig Start Date End Date Taking? Authorizing Provider  citalopram (CELEXA) 40 MG tablet Take 40 mg by mouth daily.   Yes Historical Provider, MD  hydrochlorothiazide (HYDRODIURIL) 25 MG tablet Take 12.5 mg by mouth daily.   Yes Historical Provider, MD  omeprazole (PRILOSEC) 20 MG capsule Take 20 mg by mouth daily.   Yes Historical Provider, MD   BP 120/50 mmHg  Pulse 64  Temp(Src) 98.8 F (37.1 C) (Oral)  Resp 18  Ht 6\' 1"  (1.854 m)  Wt 230 lb (104.327 kg)  BMI 30.35 kg/m2  SpO2 100% Physical Exam  Constitutional: She is oriented to person, place, and time. She appears well-developed and well-nourished.  Non-toxic appearance.  HENT:  Head: Normocephalic.  Right Ear: Tympanic membrane and external ear normal.  Left Ear: Tympanic membrane and external ear normal.  `there are denuded skin areas beside and just behind the helix of the left ear. There is no redness involving the mastoid area. There no red streaks appreciated. There is no drainage from the left ear. The tympanic membrane and auditory canal are well within normal limits.  Eyes: EOM and lids are normal. Pupils are equal, round, and reactive to light.  Neck: Normal range  of motion. Neck supple. Carotid bruit is not present.  Cardiovascular: Normal rate, regular rhythm, normal heart sounds, intact distal pulses and normal pulses.   Pulmonary/Chest: Breath sounds normal. No respiratory distress.  Abdominal: Soft. Bowel sounds are normal. There is no tenderness. There is no guarding.  Musculoskeletal: Normal range of motion.  Lymphadenopathy:       Head (right side): No submandibular adenopathy present.       Head (left side): No submandibular adenopathy present.    She has no cervical  adenopathy.  Neurological: She is alert and oriented to person, place, and time. She has normal strength. No cranial nerve deficit or sensory deficit.  Skin: Skin is warm and dry.  Psychiatric: She has a normal mood and affect. Her speech is normal.  Nursing note and vitals reviewed.   ED Course  Procedures (including critical care time) Labs Review Labs Reviewed  CBG MONITORING, ED    Imaging Review No results found.   EKG Interpretation None      MDM  There are denuded areas adjacent to and behind the helix of the left ear. There is some mild swelling and mild redness present, consistent with a skin infection. There is no drainage. There is no red streaks appreciated. Vital signs are well within normal limits.  The plan at this time is for the patient to use Neosporin after cleansing the area. Patient will also be placed on Bactrim twice a day. The patient is to see her primary physician or return to the emergency department if any changes, problems, or concerns.   Final diagnoses:  None    **I have reviewed nursing notes, vital signs, and all appropriate lab and imaging results for this patient.Kathie Dike*    Trisha Morandi M Chriss Redel, PA-C 08/25/14 2116  Donnetta HutchingBrian Cook, MD 08/30/14 [phone]

## 2014-08-23 NOTE — ED Notes (Signed)
Pt with possible abscess to side of left ear for 4 days, denies fever

## 2014-11-14 ENCOUNTER — Other Ambulatory Visit (HOSPITAL_COMMUNITY): Payer: Self-pay | Admitting: Family Medicine

## 2014-11-14 DIAGNOSIS — N6310 Unspecified lump in the right breast, unspecified quadrant: Secondary | ICD-10-CM

## 2014-11-22 ENCOUNTER — Other Ambulatory Visit (HOSPITAL_COMMUNITY): Payer: Self-pay | Admitting: Family Medicine

## 2014-11-22 ENCOUNTER — Ambulatory Visit (HOSPITAL_COMMUNITY)
Admission: RE | Admit: 2014-11-22 | Discharge: 2014-11-22 | Disposition: A | Discharge status: Home / Self Care | Payer: Medicaid Other | Source: Ambulatory Visit | Attending: Family Medicine | Admitting: Family Medicine

## 2014-11-22 DIAGNOSIS — N63 Unspecified lump in breast: Secondary | ICD-10-CM | POA: Diagnosis present

## 2014-11-22 DIAGNOSIS — N6310 Unspecified lump in the right breast, unspecified quadrant: Secondary | ICD-10-CM

## 2014-11-24 ENCOUNTER — Emergency Department (HOSPITAL_COMMUNITY)
Admission: EM | Admit: 2014-11-24 | Discharge: 2014-11-24 | Disposition: A | Discharge status: Home / Self Care | Payer: Medicaid Other | Attending: Emergency Medicine | Admitting: Emergency Medicine

## 2014-11-24 ENCOUNTER — Encounter (HOSPITAL_COMMUNITY): Payer: Self-pay

## 2014-11-24 DIAGNOSIS — Z9851 Tubal ligation status: Secondary | ICD-10-CM | POA: Insufficient documentation

## 2014-11-24 DIAGNOSIS — F419 Anxiety disorder, unspecified: Secondary | ICD-10-CM | POA: Diagnosis not present

## 2014-11-24 DIAGNOSIS — F329 Major depressive disorder, single episode, unspecified: Secondary | ICD-10-CM | POA: Insufficient documentation

## 2014-11-24 DIAGNOSIS — Z9049 Acquired absence of other specified parts of digestive tract: Secondary | ICD-10-CM | POA: Diagnosis not present

## 2014-11-24 DIAGNOSIS — Z79899 Other long term (current) drug therapy: Secondary | ICD-10-CM | POA: Diagnosis not present

## 2014-11-24 DIAGNOSIS — R1013 Epigastric pain: Secondary | ICD-10-CM | POA: Diagnosis present

## 2014-11-24 DIAGNOSIS — I10 Essential (primary) hypertension: Secondary | ICD-10-CM | POA: Diagnosis not present

## 2014-11-24 DIAGNOSIS — K219 Gastro-esophageal reflux disease without esophagitis: Secondary | ICD-10-CM | POA: Diagnosis not present

## 2014-11-24 LAB — URINALYSIS, ROUTINE W REFLEX MICROSCOPIC
Bilirubin Urine: NEGATIVE
Glucose, UA: NEGATIVE mg/dL
Ketones, ur: NEGATIVE mg/dL
Leukocytes, UA: NEGATIVE
Nitrite: NEGATIVE
Protein, ur: 30 mg/dL — AB
Specific Gravity, Urine: 1.02 (ref 1.005–1.030)
Urobilinogen, UA: 0.2 mg/dL (ref 0.0–1.0)
pH: 6.5 (ref 5.0–8.0)

## 2014-11-24 LAB — CBC WITH DIFFERENTIAL/PLATELET
Basophils Absolute: 0.1 10*3/uL (ref 0.0–0.1)
Basophils Relative: 1 % (ref 0–1)
Eosinophils Absolute: 0.3 10*3/uL (ref 0.0–0.7)
Eosinophils Relative: 4 % (ref 0–5)
HCT: 35.9 % — ABNORMAL LOW (ref 36.0–46.0)
HEMOGLOBIN: 11.5 g/dL — AB (ref 12.0–15.0)
LYMPHS ABS: 2.5 10*3/uL (ref 0.7–4.0)
Lymphocytes Relative: 39 % (ref 12–46)
MCH: 29.2 pg (ref 26.0–34.0)
MCHC: 32 g/dL (ref 30.0–36.0)
MCV: 91.1 fL (ref 78.0–100.0)
MONO ABS: 0.5 10*3/uL (ref 0.1–1.0)
Monocytes Relative: 8 % (ref 3–12)
Neutro Abs: 3.1 10*3/uL (ref 1.7–7.7)
Neutrophils Relative %: 48 % (ref 43–77)
Platelets: 308 10*3/uL (ref 150–400)
RBC: 3.94 MIL/uL (ref 3.87–5.11)
RDW: 15.3 % (ref 11.5–15.5)
WBC: 6.4 10*3/uL (ref 4.0–10.5)

## 2014-11-24 LAB — COMPREHENSIVE METABOLIC PANEL
ALK PHOS: 79 U/L (ref 39–117)
ALT: 43 U/L — AB (ref 0–35)
ANION GAP: 4 — AB (ref 5–15)
AST: 26 U/L (ref 0–37)
Albumin: 3.7 g/dL (ref 3.5–5.2)
BUN: 14 mg/dL (ref 6–23)
CALCIUM: 9 mg/dL (ref 8.4–10.5)
CHLORIDE: 110 mmol/L (ref 96–112)
CO2: 27 mmol/L (ref 19–32)
CREATININE: 0.75 mg/dL (ref 0.50–1.10)
GFR calc non Af Amer: >90 mL/min (ref >90)
Glucose, Bld: 107 mg/dL — ABNORMAL HIGH (ref 70–99)
POTASSIUM: 3.8 mmol/L (ref 3.5–5.1)
Sodium: 141 mmol/L (ref 135–145)
TOTAL PROTEIN: 7.5 g/dL (ref 6.0–8.3)
Total Bilirubin: 0.6 mg/dL (ref 0.3–1.2)

## 2014-11-24 LAB — TROPONIN I: Troponin I: <0.03 ng/mL (ref <0.031)

## 2014-11-24 LAB — LIPASE, BLOOD: LIPASE: 23 U/L (ref 11–59)

## 2014-11-24 LAB — URINE MICROSCOPIC-ADD ON

## 2014-11-24 MED ORDER — GI COCKTAIL ~~LOC~~: 30.0000 mL | Freq: Once | ORAL | Status: DC

## 2014-11-24 MED ORDER — GI COCKTAIL ~~LOC~~
30.0000 mL | Freq: Once | ORAL | Status: AC
  Administered 2014-11-24: 30 mL via ORAL

## 2014-11-24 MED ORDER — ONDANSETRON 8 MG PO TBDP: 8.0000 mg | ORAL_TABLET | Freq: Once | ORAL | Status: DC

## 2014-11-24 MED ORDER — GI COCKTAIL ~~LOC~~
ORAL | Status: AC
  Filled 2014-11-24: qty 30

## 2014-11-24 MED ORDER — PANTOPRAZOLE SODIUM 40 MG PO TBEC: 40.0000 mg | DELAYED_RELEASE_TABLET | Freq: Every day | ORAL | Status: DC

## 2014-11-24 NOTE — ED Notes (Signed)
Pt c/o upper abd pain and vomiting after meals x 2 weeks. LBM was yesterday.

## 2014-11-24 NOTE — ED Provider Notes (Signed)
CSN: [id number]     Arrival date & time 11/24/14  0754 History   First MD Initiated Contact with Patient 11/24/14 0801     Chief Complaint  Patient presents with  . Abdominal Pain     (Consider location/radiation/quality/duration/timing/severity/associated sxs/prior Treatment) Patient is a 54 y.o. female presenting with abdominal pain. The history is provided by the patient.  Abdominal Pain Pain location:  Epigastric Pain quality: burning and sharp   Pain radiates to:  Chest Pain severity:  Severe Onset quality:  Gradual (Last episode occured 2 days ago) Duration:  2 weeks Timing:  Intermittent (post prandial with certain foods such as spicy, greasy foods. ) Progression:  Unchanged Chronicity:  New (2 weeks) Context: eating   Context: not alcohol use, not recent illness and not retching   Relieved by:  Nothing Worsened by:  Eating Ineffective treatments:  Antacids (Has previously been on prilosec, not currently taking.  She was prescribed pepcid by pcp last month which is not working.  Attempted to get  another med called in, has be seen first and get to pcps office.) Associated symptoms: no belching, no chest pain, no chills, no constipation, no cough, no diarrhea, no dysuria, no fever, no nausea, no shortness of breath, no sore throat and no vomiting   Risk factors: no alcohol abuse and no NSAID use     Past Medical History  Diagnosis Date  . Depression   . Anxiety   . Hypertension    Past Surgical History  Procedure Laterality Date  . Cholecystectomy    . Tubal ligation     Family History  Problem Relation Age of Onset  . Diabetes Mother    History  Substance Use Topics  . Smoking status: Never Smoker   . Smokeless tobacco: Not on file  . Alcohol Use: No   OB History    No data available     Review of Systems  Constitutional: Negative for fever and chills.  HENT: Negative for congestion and sore throat.   Eyes: Negative.   Respiratory: Negative for cough,  chest tightness and shortness of breath.   Cardiovascular: Negative for chest pain.  Gastrointestinal: Positive for abdominal pain. Negative for nausea, vomiting, diarrhea and constipation.  Genitourinary: Negative.  Negative for dysuria.  Musculoskeletal: Negative for joint swelling, arthralgias and neck pain.  Skin: Negative.  Negative for rash and wound.  Neurological: Negative for dizziness, weakness, light-headedness, numbness and headaches.  Psychiatric/Behavioral: Negative.       Allergies  Ibuprofen  Home Medications   Prior to Admission medications   Medication Sig Start Date End Date Taking? Authorizing Provider  famotidine (PEPCID) 10 MG tablet Take 10 mg by mouth daily.   Yes Historical Provider, MD  citalopram (CELEXA) 40 MG tablet Take 40 mg by mouth daily.    Historical Provider, MD  hydrochlorothiazide (HYDRODIURIL) 25 MG tablet Take 12.5 mg by mouth daily.    Historical Provider, MD  omeprazole (PRILOSEC) 20 MG capsule Take 20 mg by mouth daily.    Historical Provider, MD  pantoprazole (PROTONIX) 40 MG tablet Take 1 tablet (40 mg total) by mouth daily. 11/24/14   Burgess AmorJulie Gigi Onstad, PA-C   BP 123/73 mmHg  Pulse 66  Temp(Src) 98.5 F (36.9 C) (Oral)  Resp 20  Ht 6\' 1"  (1.854 m)  Wt 229 lb (103.874 kg)  BMI 30.22 kg/m2  SpO2 100% Physical Exam  Constitutional: She appears well-developed and well-nourished.  HENT:  Head: Normocephalic and atraumatic.  Eyes: Conjunctivae  are normal.  Neck: Normal range of motion.  Cardiovascular: Normal rate, regular rhythm, normal heart sounds and intact distal pulses.   Pulmonary/Chest: Effort normal and breath sounds normal. No respiratory distress. She has no wheezes. She exhibits no tenderness.  Abdominal: Soft. Bowel sounds are normal. She exhibits no distension. There is no tenderness. There is no guarding.  Musculoskeletal: Normal range of motion.  Neurological: She is alert.  Skin: Skin is warm and dry.  Psychiatric: She has  a normal mood and affect.  Nursing note and vitals reviewed.   ED Course  Procedures (including critical care time) Labs Review Labs Reviewed  CBC WITH DIFFERENTIAL/PLATELET - Abnormal; Notable for the following:    Hemoglobin 11.5 (*)    HCT 35.9 (*)    All other components within normal limits  COMPREHENSIVE METABOLIC PANEL - Abnormal; Notable for the following:    Glucose, Bld 107 (*)    ALT 43 (*)    Anion gap 4 (*)    All other components within normal limits  URINALYSIS, ROUTINE W REFLEX MICROSCOPIC - Abnormal; Notable for the following:    Hgb urine dipstick TRACE (*)    Protein, ur 30 (*)    All other components within normal limits  LIPASE, BLOOD  TROPONIN I  URINE MICROSCOPIC-ADD ON    Imaging Review    EKG Interpretation   Date/Time:  Thursday November 24 2014 08:23:57 EST Ventricular Rate:  67 PR Interval:  170 QRS Duration: 94 QT Interval:  405 QTC Calculation: 427 R Axis:   27 Text Interpretation:  Sinus rhythm No significant change was found  Confirmed by Manus Gunning  MD, STEPHEN ([id number]) on 11/24/2014 8:29:27 AM      MDM   Final diagnoses:  Gastroesophageal reflux disease, esophagitis presence not specified    Pt had episode of sx while here, GI cocktail given with resolution.  Will add protonix for additional gerd coverage. Advised f/u with pcp.  Patients labs and/or radiological studies were viewed and considered during the medical decision making and disposition process. Troponin/ekg stable .  Doubt cardiac.      Burgess Amor, PA-C 11/24/14 4098  [name], MD 11/24/14 [phone]

## 2014-11-24 NOTE — Discharge Instructions (Signed)
Gastroesophageal Reflux Disease, Adult °Gastroesophageal reflux disease (GERD) happens when acid from your stomach goes into your food pipe (esophagus). The acid can cause a burning feeling in your chest. Over time, the acid can make small holes (ulcers) in your food pipe.  °HOME CARE °· Ask your doctor for advice about: °¨ Losing weight. °¨ Quitting smoking. °¨ Alcohol use. °· Avoid foods and drinks that make your problems worse. You may want to avoid: °¨ Caffeine and alcohol. °¨ Chocolate. °¨ Mints. °¨ Garlic and onions. °¨ Spicy foods. °¨ Citrus fruits, such as oranges, lemons, or limes. °¨ Foods that contain tomato, such as sauce, chili, salsa, and pizza. °¨ Fried and fatty foods. °· Avoid lying down for 3 hours before you go to bed or before you take a nap. °· Eat small meals often, instead of large meals. °· Wear loose-fitting clothing. Do not wear anything tight around your waist. °· Raise (elevate) the head of your bed 6 to 8 inches with wood blocks. Using extra pillows does not help. °· Only take medicines as told by your doctor. °· Do not take aspirin or ibuprofen. °GET HELP RIGHT AWAY IF:  °· You have pain in your arms, neck, jaw, teeth, or back. °· Your pain gets worse or changes. °· You feel sick to your stomach (nauseous), throw up (vomit), or sweat (diaphoresis). °· You feel short of breath, or you pass out (faint). °· Your throw up is green, yellow, black, or looks like coffee grounds or blood. °· Your poop (stool) is red, bloody, or black. °MAKE SURE YOU:  °· Understand these instructions. °· Will watch your condition. °· Will get help right away if you are not doing well or get worse. °Document Released: 03/25/2008 Document Revised: 12/30/2011 Document Reviewed: 04/26/2011 °[employer]® Patient Information ©2015 [employer]. This information is not intended to replace advice given to you by your health care provider. Make sure you discuss any questions you have with your health care provider. ° °

## 2014-11-29 ENCOUNTER — Encounter (HOSPITAL_COMMUNITY): Payer: Self-pay | Admitting: Emergency Medicine

## 2014-11-29 ENCOUNTER — Emergency Department (HOSPITAL_COMMUNITY)
Admission: EM | Admit: 2014-11-29 | Discharge: 2014-11-29 | Disposition: A | Discharge status: Home / Self Care | Payer: Medicaid Other | Attending: Emergency Medicine | Admitting: Emergency Medicine

## 2014-11-29 DIAGNOSIS — L0889 Other specified local infections of the skin and subcutaneous tissue: Secondary | ICD-10-CM | POA: Diagnosis not present

## 2014-11-29 DIAGNOSIS — F329 Major depressive disorder, single episode, unspecified: Secondary | ICD-10-CM | POA: Insufficient documentation

## 2014-11-29 DIAGNOSIS — L02811 Cutaneous abscess of head [any part, except face]: Secondary | ICD-10-CM | POA: Diagnosis present

## 2014-11-29 DIAGNOSIS — F419 Anxiety disorder, unspecified: Secondary | ICD-10-CM | POA: Diagnosis not present

## 2014-11-29 DIAGNOSIS — I10 Essential (primary) hypertension: Secondary | ICD-10-CM | POA: Diagnosis not present

## 2014-11-29 DIAGNOSIS — B9689 Other specified bacterial agents as the cause of diseases classified elsewhere: Secondary | ICD-10-CM | POA: Diagnosis not present

## 2014-11-29 DIAGNOSIS — L089 Local infection of the skin and subcutaneous tissue, unspecified: Secondary | ICD-10-CM

## 2014-11-29 DIAGNOSIS — Z79899 Other long term (current) drug therapy: Secondary | ICD-10-CM | POA: Insufficient documentation

## 2014-11-29 MED ORDER — SULFAMETHOXAZOLE-TRIMETHOPRIM 800-160 MG PO TABS: 1.0000 | ORAL_TABLET | Freq: Two times a day (BID) | ORAL | Status: DC

## 2014-11-29 MED ORDER — MUPIROCIN 2 % EX OINT: TOPICAL_OINTMENT | CUTANEOUS | Status: DC

## 2014-11-29 NOTE — ED Notes (Signed)
Pt c/o onset of abscess to L post upper scalp 4 days ago. Pt has second area that started appox 2 days ago.

## 2014-11-29 NOTE — ED Provider Notes (Signed)
CSN: [id number]     Arrival date & time 11/29/14  0744 History   First MD Initiated Contact with Patient 11/29/14 [phone]     Chief Complaint  Patient presents with  . Abscess     (Consider location/radiation/quality/duration/timing/severity/associated sxs/prior Treatment) HPI  Janet Mcguire is a 54 y.o. female who presents to the Emergency Department complaining of recurrent abscess to the right scalp.  She reports a painful "knot" to her right scalp for 4 days.  She states she has been squeezing the area and yellow pus has been draining from it.  She also notes a swollen tender area to her right neck that began 2 days after the abscess.  She denies fever, chills, neck stiffness, vomiting or swelling.  Nothing makes the symptoms better or worse   Past Medical History  Diagnosis Date  . Depression   . Anxiety   . Hypertension    Past Surgical History  Procedure Laterality Date  . Cholecystectomy    . Tubal ligation     Family History  Problem Relation Age of Onset  . Diabetes Mother    History  Substance Use Topics  . Smoking status: Never Smoker   . Smokeless tobacco: Not on file  . Alcohol Use: No   OB History    Gravida Para Term Preterm AB TAB SAB Ectopic Multiple Living   3 1 1  2 2          Review of Systems  Constitutional: Negative for fever and chills.  HENT: Negative for ear pain, facial swelling and sore throat.   Gastrointestinal: Negative for nausea, vomiting and abdominal pain.  Musculoskeletal: Negative for joint swelling and arthralgias.  Skin: Positive for color change.       Abscess to right scalp  Neurological: Negative for dizziness, weakness, numbness and headaches.  Hematological: Negative for adenopathy.  All other systems reviewed and are negative.     Allergies  Ibuprofen  Home Medications   Prior to Admission medications   Medication Sig Start Date End Date Taking? Authorizing Provider  citalopram (CELEXA) 40 MG tablet Take 40 mg by  mouth daily.   Yes Historical Provider, MD  famotidine (PEPCID) 10 MG tablet Take 10 mg by mouth daily.   Yes Historical Provider, MD  hydrochlorothiazide (HYDRODIURIL) 25 MG tablet Take 25 mg by mouth daily.    Yes Historical Provider, MD  pantoprazole (PROTONIX) 40 MG tablet Take 1 tablet (40 mg total) by mouth daily. 11/24/14  Yes Raynelle FanningJulie Idol, PA-C   BP 116/51 mmHg  Pulse 70  Temp(Src) 98.2 F (36.8 C) (Oral)  Resp 14  Ht 6\' 1"  (1.854 m)  Wt 229 lb (103.874 kg)  BMI 30.22 kg/m2  SpO2 100% Physical Exam  Constitutional: She is oriented to person, place, and time. She appears well-developed and well-nourished. No distress.  HENT:  Head: Normocephalic and atraumatic.  Mouth/Throat: Oropharynx is clear and moist.  1 cm nodule to the right mid scalp that has slight purulent drainage.  No significant induration or fluctuance.    Neck: Normal range of motion. Neck supple.  Cardiovascular: Normal rate, regular rhythm and normal heart sounds.   No murmur heard. Pulmonary/Chest: Effort normal and breath sounds normal. No respiratory distress.  Musculoskeletal: Normal range of motion.  Lymphadenopathy:    She has cervical adenopathy.       Right cervical: Posterior cervical adenopathy present. No superficial cervical adenopathy present.      Left cervical: No posterior cervical adenopathy present.  Neurological: She is alert and oriented to person, place, and time. She exhibits normal muscle tone. Coordination normal.  Skin: Skin is warm and dry.  Psychiatric: She has a normal mood and affect.  Nursing note and vitals reviewed.   ED Course  Procedures (including critical care time) Labs Review Labs Reviewed - No data to display  Imaging Review No results found.   EKG Interpretation None      MDM   Final diagnoses:  Localized bacterial skin infection    Patient is well appearing.  recurrent skin lesions, likely related to MRSA.  No drainable abscess at this time.  No kerion.   She agrees to warm soaks, bactrim and bactroban cream.      Tita Terhaar L. Trisha Mangle, PA-C 11/29/14 1610  Gilda Crease, MD 11/29/14 [phone]

## 2014-12-21 ENCOUNTER — Encounter (HOSPITAL_COMMUNITY): Payer: Self-pay | Admitting: Emergency Medicine

## 2014-12-21 ENCOUNTER — Emergency Department (HOSPITAL_COMMUNITY)
Admission: EM | Admit: 2014-12-21 | Discharge: 2014-12-21 | Disposition: A | Discharge status: Home / Self Care | Payer: No Typology Code available for payment source | Attending: Emergency Medicine | Admitting: Emergency Medicine

## 2014-12-21 DIAGNOSIS — S161XXA Strain of muscle, fascia and tendon at neck level, initial encounter: Secondary | ICD-10-CM | POA: Insufficient documentation

## 2014-12-21 DIAGNOSIS — S46911A Strain of unspecified muscle, fascia and tendon at shoulder and upper arm level, right arm, initial encounter: Secondary | ICD-10-CM | POA: Insufficient documentation

## 2014-12-21 DIAGNOSIS — Y9389 Activity, other specified: Secondary | ICD-10-CM | POA: Insufficient documentation

## 2014-12-21 DIAGNOSIS — Z792 Long term (current) use of antibiotics: Secondary | ICD-10-CM | POA: Diagnosis not present

## 2014-12-21 DIAGNOSIS — T07XXXA Unspecified multiple injuries, initial encounter: Secondary | ICD-10-CM

## 2014-12-21 DIAGNOSIS — S39012A Strain of muscle, fascia and tendon of lower back, initial encounter: Secondary | ICD-10-CM | POA: Diagnosis not present

## 2014-12-21 DIAGNOSIS — Z79899 Other long term (current) drug therapy: Secondary | ICD-10-CM | POA: Insufficient documentation

## 2014-12-21 DIAGNOSIS — I10 Essential (primary) hypertension: Secondary | ICD-10-CM | POA: Diagnosis not present

## 2014-12-21 DIAGNOSIS — F419 Anxiety disorder, unspecified: Secondary | ICD-10-CM | POA: Insufficient documentation

## 2014-12-21 DIAGNOSIS — Y998 Other external cause status: Secondary | ICD-10-CM | POA: Insufficient documentation

## 2014-12-21 DIAGNOSIS — Y92410 Unspecified street and highway as the place of occurrence of the external cause: Secondary | ICD-10-CM | POA: Insufficient documentation

## 2014-12-21 DIAGNOSIS — S4991XA Unspecified injury of right shoulder and upper arm, initial encounter: Secondary | ICD-10-CM | POA: Diagnosis present

## 2014-12-21 DIAGNOSIS — F329 Major depressive disorder, single episode, unspecified: Secondary | ICD-10-CM | POA: Diagnosis not present

## 2014-12-21 MED ORDER — METHOCARBAMOL 500 MG PO TABS
1000.0000 mg | ORAL_TABLET | Freq: Once | ORAL | Status: AC
  Administered 2014-12-21: 1000 mg via ORAL
  Filled 2014-12-21: qty 2

## 2014-12-21 MED ORDER — METHOCARBAMOL 500 MG PO TABS: 500.0000 mg | ORAL_TABLET | Freq: Three times a day (TID) | ORAL | Status: DC

## 2014-12-21 MED ORDER — HYDROCODONE-ACETAMINOPHEN 5-325 MG PO TABS: 1.0000 | ORAL_TABLET | ORAL | Status: DC | PRN

## 2014-12-21 MED ORDER — HYDROCODONE-ACETAMINOPHEN 5-325 MG PO TABS
1.0000 | ORAL_TABLET | Freq: Once | ORAL | Status: AC
  Administered 2014-12-21: 1 via ORAL
  Filled 2014-12-21: qty 1
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
## 2014-12-21 NOTE — Discharge Instructions (Signed)
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
## 2015-01-11 ENCOUNTER — Emergency Department (HOSPITAL_COMMUNITY): Payer: Medicaid Other

## 2015-01-11 ENCOUNTER — Encounter (HOSPITAL_COMMUNITY): Payer: Self-pay

## 2015-01-11 ENCOUNTER — Inpatient Hospital Stay (HOSPITAL_COMMUNITY)
Admission: EM | Admit: 2015-01-11 | Discharge: 2015-01-13 | DRG: 446 | Disposition: A | Discharge status: Home / Self Care | Payer: Medicaid Other | Attending: Internal Medicine | Admitting: Internal Medicine

## 2015-01-11 DIAGNOSIS — F32A Depression, unspecified: Secondary | ICD-10-CM

## 2015-01-11 DIAGNOSIS — R1013 Epigastric pain: Secondary | ICD-10-CM

## 2015-01-11 DIAGNOSIS — Z833 Family history of diabetes mellitus: Secondary | ICD-10-CM

## 2015-01-11 DIAGNOSIS — F329 Major depressive disorder, single episode, unspecified: Secondary | ICD-10-CM | POA: Diagnosis present

## 2015-01-11 DIAGNOSIS — Z9049 Acquired absence of other specified parts of digestive tract: Secondary | ICD-10-CM | POA: Diagnosis present

## 2015-01-11 DIAGNOSIS — K831 Obstruction of bile duct: Secondary | ICD-10-CM

## 2015-01-11 DIAGNOSIS — R748 Abnormal levels of other serum enzymes: Secondary | ICD-10-CM | POA: Diagnosis present

## 2015-01-11 DIAGNOSIS — E876 Hypokalemia: Secondary | ICD-10-CM | POA: Diagnosis present

## 2015-01-11 DIAGNOSIS — K8050 Calculus of bile duct without cholangitis or cholecystitis without obstruction: Secondary | ICD-10-CM | POA: Diagnosis present

## 2015-01-11 DIAGNOSIS — R1012 Left upper quadrant pain: Secondary | ICD-10-CM | POA: Diagnosis not present

## 2015-01-11 DIAGNOSIS — I10 Essential (primary) hypertension: Secondary | ICD-10-CM | POA: Diagnosis present

## 2015-01-11 DIAGNOSIS — Z9889 Other specified postprocedural states: Secondary | ICD-10-CM | POA: Diagnosis not present

## 2015-01-11 DIAGNOSIS — K8051 Calculus of bile duct without cholangitis or cholecystitis with obstruction: Secondary | ICD-10-CM | POA: Diagnosis not present

## 2015-01-11 LAB — COMPREHENSIVE METABOLIC PANEL
ALBUMIN: 4.1 g/dL (ref 3.5–5.2)
ALT: 234 U/L — ABNORMAL HIGH (ref 0–35)
AST: 353 U/L — ABNORMAL HIGH (ref 0–37)
Alkaline Phosphatase: 244 U/L — ABNORMAL HIGH (ref 39–117)
Anion gap: 6 (ref 5–15)
BUN: 12 mg/dL (ref 6–23)
CALCIUM: 9.1 mg/dL (ref 8.4–10.5)
CO2: 28 mmol/L (ref 19–32)
Chloride: 105 mmol/L (ref 96–112)
Creatinine, Ser: 0.84 mg/dL (ref 0.50–1.10)
GFR calc non Af Amer: 79 mL/min — ABNORMAL LOW (ref >90)
GLUCOSE: 111 mg/dL — AB (ref 70–99)
POTASSIUM: 3.2 mmol/L — AB (ref 3.5–5.1)
Sodium: 139 mmol/L (ref 135–145)
Total Bilirubin: 0.7 mg/dL (ref 0.3–1.2)
Total Protein: 7.8 g/dL (ref 6.0–8.3)

## 2015-01-11 LAB — CBC WITH DIFFERENTIAL/PLATELET
Basophils Absolute: 0.1 10*3/uL (ref 0.0–0.1)
Basophils Relative: 1 % (ref 0–1)
Eosinophils Absolute: 0.3 10*3/uL (ref 0.0–0.7)
Eosinophils Relative: 4 % (ref 0–5)
HEMATOCRIT: 37.4 % (ref 36.0–46.0)
Hemoglobin: 12.2 g/dL (ref 12.0–15.0)
LYMPHS PCT: 29 % (ref 12–46)
Lymphs Abs: 2.6 10*3/uL (ref 0.7–4.0)
MCH: 29.5 pg (ref 26.0–34.0)
MCHC: 32.6 g/dL (ref 30.0–36.0)
MCV: 90.3 fL (ref 78.0–100.0)
MONO ABS: 0.8 10*3/uL (ref 0.1–1.0)
MONOS PCT: 9 % (ref 3–12)
Neutro Abs: 5.3 10*3/uL (ref 1.7–7.7)
Neutrophils Relative %: 57 % (ref 43–77)
Platelets: 354 10*3/uL (ref 150–400)
RBC: 4.14 MIL/uL (ref 3.87–5.11)
RDW: 14.9 % (ref 11.5–15.5)
WBC: 9.1 10*3/uL (ref 4.0–10.5)

## 2015-01-11 LAB — LIPASE, BLOOD: Lipase: 22 U/L (ref 11–59)

## 2015-01-11 MED ORDER — MORPHINE SULFATE 4 MG/ML IJ SOLN
4.0000 mg | Freq: Once | INTRAMUSCULAR | Status: AC
  Administered 2015-01-11: 4 mg via INTRAVENOUS
  Filled 2015-01-11: qty 1

## 2015-01-11 MED ORDER — MORPHINE SULFATE 4 MG/ML IJ SOLN: 4.0000 mg | INTRAMUSCULAR | Status: DC | PRN

## 2015-01-11 MED ORDER — FENTANYL CITRATE 0.05 MG/ML IJ SOLN
50.0000 ug | Freq: Once | INTRAMUSCULAR | Status: AC
  Administered 2015-01-11: 50 ug via INTRAVENOUS
  Filled 2015-01-11: qty 2

## 2015-01-11 MED ORDER — POLYETHYLENE GLYCOL 3350 17 G PO PACK: 17.0000 g | PACK | Freq: Every day | ORAL | Status: DC | PRN

## 2015-01-11 MED ORDER — SODIUM CHLORIDE 0.45 % IV SOLN
INTRAVENOUS | Status: DC
Start: 2015-01-11 — End: 2015-01-13
  Administered 2015-01-11: 1 mL via INTRAVENOUS
  Administered 2015-01-12: 19:00:00 via INTRAVENOUS
  Administered 2015-01-13: 1 mL via INTRAVENOUS

## 2015-01-11 MED ORDER — IOHEXOL 300 MG/ML  SOLN
100.0000 mL | Freq: Once | INTRAMUSCULAR | Status: AC | PRN
  Administered 2015-01-11: 100 mL via INTRAVENOUS

## 2015-01-11 MED ORDER — ONDANSETRON HCL 4 MG/2ML IJ SOLN: 4.0000 mg | Freq: Four times a day (QID) | INTRAMUSCULAR | Status: DC | PRN

## 2015-01-11 MED ORDER — SENNA 8.6 MG PO TABS
1.0000 | ORAL_TABLET | Freq: Two times a day (BID) | ORAL | Status: DC
Start: 2015-01-11 — End: 2015-01-13
  Administered 2015-01-11 – 2015-01-13 (×3): 8.6 mg via ORAL
  Filled 2015-01-11 (×7): qty 1

## 2015-01-11 MED ORDER — ONDANSETRON HCL 4 MG PO TABS: 4.0000 mg | ORAL_TABLET | Freq: Four times a day (QID) | ORAL | Status: DC | PRN

## 2015-01-11 MED ORDER — ACETAMINOPHEN 650 MG RE SUPP: 650.0000 mg | Freq: Four times a day (QID) | RECTAL | Status: DC | PRN

## 2015-01-11 MED ORDER — POTASSIUM CHLORIDE 10 MEQ/100ML IV SOLN
10.0000 meq | INTRAVENOUS | Status: AC
  Administered 2015-01-11 – 2015-01-12 (×5): 10 meq via INTRAVENOUS
  Filled 2015-01-11 (×2): qty 100

## 2015-01-11 MED ORDER — ONDANSETRON HCL 4 MG/2ML IJ SOLN
4.0000 mg | Freq: Once | INTRAMUSCULAR | Status: AC
  Administered 2015-01-11: 4 mg via INTRAVENOUS
  Filled 2015-01-11: qty 2

## 2015-01-11 MED ORDER — SODIUM CHLORIDE 0.9 % IV BOLUS (SEPSIS)
1000.0000 mL | Freq: Once | INTRAVENOUS | Status: AC
  Administered 2015-01-11: 1000 mL via INTRAVENOUS

## 2015-01-11 MED ORDER — ACETAMINOPHEN 325 MG PO TABS: 650.0000 mg | ORAL_TABLET | Freq: Four times a day (QID) | ORAL | Status: DC | PRN

## 2015-01-11 MED ORDER — DEXTROSE 5 % IV SOLN
1.0000 g | Freq: Once | INTRAVENOUS | Status: AC
  Administered 2015-01-11: 1 g via INTRAVENOUS
  Filled 2015-01-11: qty 10

## 2015-01-11 MED ORDER — SODIUM CHLORIDE 0.9 % IV SOLN
INTRAVENOUS | Status: AC
  Administered 2015-01-11: 20:00:00 via INTRAVENOUS

## 2015-01-11 NOTE — ED Notes (Signed)
Pt reporting improvement in pain level and nausea. No complaints at present time.

## 2015-01-11 NOTE — ED Notes (Signed)
Pt c/o abd pain and back pain x 1 week.  Reports n/v/d x 2 days.

## 2015-01-11 NOTE — ED Provider Notes (Signed)
CSN: [id number]     Arrival date & time 01/11/15  1405 History  This chart was scribed for Donnetta HutchingBrian Mao Lockner, MD by Luisa DagoPriscilla Tutu, Medical Scribe. This patient was seen in room APA11/APA11 and the patient's care was started at 2:20 PM.     Chief Complaint  Patient presents with  . Abdominal Pain  . Back Pain    The history is provided by the patient and medical records. No language interpreter was used.   HPI Comments: Janet Mcguire is a 54 y.o. female with PMhx of HTN presents to the Emergency Department complaining of gradual onset epigastric and periumbilical abdominal pain that radiates to her back which started 1 week ago. She reports associated increased urinary frequency and nausea. Pt states that she has been seen here in the past for similar symptoms. She admits to a C-section, tubal ligation and cholecystectomy. She denies any fever, vaginal bleeding, dysuria, hematuria,  visual disturbance, CP, cough, SOB, abdominal pain, nausea, emesis, diarrhea, urinary symptoms,  HA, weakness, numbness and rash as associated symptoms. Pt admits to drinking socially but has not done so in some time.     Past Medical History  Diagnosis Date  . Depression   . Anxiety   . Hypertension    Past Surgical History  Procedure Laterality Date  . Cholecystectomy    . Tubal ligation     Family History  Problem Relation Age of Onset  . Diabetes Mother    History  Substance Use Topics  . Smoking status: Never Smoker   . Smokeless tobacco: Not on file  . Alcohol Use: No   OB History    Gravida Para Term Preterm AB TAB SAB Ectopic Multiple Living   3 1 1  2 2          Review of Systems  Gastrointestinal: Positive for abdominal pain.  Musculoskeletal: Positive for back pain.  A complete 10 system review of systems was obtained and all systems are negative except as noted in the HPI and PMH.  Allergies  Ibuprofen  Home Medications   Prior to Admission medications   Medication Sig Start Date End  Date Taking? Authorizing Provider  citalopram (CELEXA) 40 MG tablet Take 40 mg by mouth daily.   Yes Historical Provider, MD  hydrochlorothiazide (HYDRODIURIL) 25 MG tablet Take 25 mg by mouth daily.    Yes Historical Provider, MD  HYDROcodone-acetaminophen (NORCO/VICODIN) 5-325 MG per tablet Take 1 tablet by mouth every 4 (four) hours as needed. Patient not taking: Reported on 01/11/2015 12/21/14   Ivery QualeHobson Bryant, PA-C  methocarbamol (ROBAXIN) 500 MG tablet Take 1 tablet (500 mg total) by mouth 3 (three) times daily. Patient not taking: Reported on 01/11/2015 12/21/14   Ivery QualeHobson Bryant, PA-C  mupirocin ointment (BACTROBAN) 2 % Apply small amt to affected area TID x 10 days Patient not taking: Reported on 12/21/2014 11/29/14   Tammi Triplett, PA-C  pantoprazole (PROTONIX) 40 MG tablet Take 1 tablet (40 mg total) by mouth daily. Patient not taking: Reported on 01/11/2015 11/24/14   Burgess AmorJulie Idol, PA-C  sulfamethoxazole-trimethoprim (SEPTRA DS) 800-160 MG per tablet Take 1 tablet by mouth 2 (two) times daily. For 10 days Patient not taking: Reported on 12/21/2014 11/29/14   Tammi Triplett, PA-C   BP 141/70 mmHg  Pulse 85  Temp(Src) 98.1 F (36.7 C) (Oral)  Resp 20  Ht 6\' 1"  (1.854 m)  Wt 229 lb (103.874 kg)  BMI 30.22 kg/m2  SpO2 99%  Physical Exam  Constitutional: She is  oriented to person, place, and time. She appears well-developed and well-nourished.  HENT:  Head: Normocephalic and atraumatic.  Eyes: Conjunctivae and EOM are normal. Pupils are equal, round, and reactive to light.  Neck: Normal range of motion. Neck supple.  Cardiovascular: Normal rate and regular rhythm.   Pulmonary/Chest: Effort normal and breath sounds normal.  Abdominal: Soft. Bowel sounds are normal. There is tenderness in the epigastric area and periumbilical area.  Musculoskeletal: Normal range of motion.  Neurological: She is alert and oriented to person, place, and time.  Skin: Skin is warm and dry.  Psychiatric: She has a  normal mood and affect. Her behavior is normal.  Nursing note and vitals reviewed.   ED Course  Procedures (including critical care time)  DIAGNOSTIC STUDIES: Oxygen Saturation is 99% on RA, normal by my interpretation.    COORDINATION OF CARE: 2:25 PM- Basic labs ordered. IV fluids will be administered. Pt advised of plan for treatment and pt agrees.  Labs Review Labs Reviewed  COMPREHENSIVE METABOLIC PANEL - Abnormal; Notable for the following:    Potassium 3.2 (*)    Glucose, Bld 111 (*)    AST 353 (*)    ALT 234 (*)    Alkaline Phosphatase 244 (*)    GFR calc non Af Amer 79 (*)    All other components within normal limits  CBC WITH DIFFERENTIAL/PLATELET  LIPASE, BLOOD  URINALYSIS, ROUTINE W REFLEX MICROSCOPIC    Imaging Review Ct Abdomen Pelvis W Contrast  01/11/2015   CLINICAL DATA:  Acute epigastric pain and back pain for 2 weeks with nausea and vomiting. History of cholecystectomy and hysterectomy.  EXAM: CT ABDOMEN AND PELVIS WITH CONTRAST  TECHNIQUE: Multidetector CT imaging of the abdomen and pelvis was performed using the standard protocol following bolus administration of intravenous contrast.  CONTRAST:  OMNIPAQUE IOHEXOL 300 MG/ML  SOLN  COMPARISON:  02/26/2012  FINDINGS: Lower chest: Clear lung bases. Normal heart size. No pericardial or pleural effusion.  Abdomen: Prior cholecystectomy. Increased diffuse intra and extrahepatic biliary dilatation compared to 02/26/2012. Biliary dilatation extends to the ampulla. Distal common bile duct diameter is 14 mm, image 26. No associated pancreatic duct dilatation.  No other focal hepatic abnormality.  Pancreas, spleen, adrenal glands, and kidneys are within normal limits for age and demonstrate no acute process.  Negative for bowel obstruction, dilatation, ileus, or free air. Postop changes throughout the anterior abdomen as before.  No abdominal free fluid, fluid collection, hemorrhage, abscess or adenopathy.  Negative for  aneurysm.  No retroperitoneal abnormality.  Normal appendix in the right lower quadrant.  Pelvis: Trace pelvic free fluid, likely physiologic. Urinary bladder unremarkable. No pelvic hemorrhage, hematoma, abscess, adenopathy, inguinal abnormality, or hernia. Uterus and adnexal normal in size.  Endplate degenerative changes of the lumbar spine. No acute compression fracture or acute osseous finding.  IMPRESSION: Increased intra and extrahepatic biliary dilatation. A component of this is likely secondary to prior cholecystectomy. Distal CBD obstruction or an ampullary abnormality cannot be excluded. Consider further evaluation with ERCP/MRCP and correlation with liver function panel.  No associated pancreatic abnormality or pancreatic ductal dilatation.  Stable postoperative findings in the abdomen  No other acute finding or abscess.   Electronically Signed   By: Judie Petit.  Shick M.D.   On: 01/11/2015 15:43     EKG Interpretation None      MDM   Final diagnoses:  Epigastric pain  Elevated liver enzymes    Patient presents with epigastric pain radiating to  the back. AST, ALT, alkaline phosphatase all elevated. White count normal. CT scan shows increased intra-and extrahepatic biliary dilatation. Discussed with Dr. Karilyn Cota gastroenterologist.  He will consult. Admit to general medicine.  I personally performed the services described in this documentation, which was scribed in my presence. The recorded information has been reviewed and is accurate.    Donnetta Hutching, MD 01/11/15 [phone]

## 2015-01-11 NOTE — H&P (Signed)
[facility] History and Physical  Janet Peachina Lanes ZOX:[id number] DOB: 14-Jan-1961 DOA: 01/11/2015  Referring physician: Dr Adriana Simasook - [facility] PCP: Evlyn CourierHILL,GERALD K, MD   Chief Complaint: Abd pain  HPI: Janet Mcguire is a 54 y.o. female  One-week history of epigastric to periumbilical abdominal pain with radiation to the back. Associated with nausea and urinary frequency. Denies dysuria, fever, vomiting, diarrhea, chest pain, shortness of breath, palpitations. Patient has had similar episodes of this pain in the distant past. Abdominal surgeries consistent with C-section, tubal ligation and cholecystectomy. Patient reports occasional alcohol use. Nothing alleviates or worsens the pain. Pain is intermittent.   Denies unintentional weight loss, night sweats, fevers.  Review of Systems:  Constitutional:  No weight loss, night sweats, Fevers, chills, fatigue.  HEENT:  No headaches, Difficulty swallowing,Tooth/dental problems,Sore throat,  No sneezing, itching, ear ache, nasal congestion, post nasal drip,  Cardio-vascular:  No chest pain, Orthopnea, PND, swelling in lower extremities, anasarca, dizziness, palpitations  GI: Per HPI Resp:   No shortness of breath with exertion or at rest. No excess mucus, no productive cough, No non-productive cough, No coughing up of blood.No change in color of mucus.No wheezing.No chest wall deformity  Skin:  no rash or lesions.  GU:  no dysuria, change in color of urine, no urgency or frequency. No flank pain.  Musculoskeletal:   No joint pain or swelling. No decreased range of motion. No back pain.  Psych:  No change in mood or affect. No depression or anxiety. No memory loss.   Past Medical History  Diagnosis Date  . Depression   . Anxiety   . Hypertension    Past Surgical History  Procedure Laterality Date  . Cholecystectomy    . Tubal ligation     Social History:  reports that she has never smoked. She does not have any smokeless tobacco history on  file. She reports that she does not drink alcohol or use illicit drugs.  Allergies  Allergen Reactions  . Ibuprofen     Told by her doctor not to take it    Family History  Problem Relation Age of Onset  . Diabetes Mother      Prior to Admission medications   Medication Sig Start Date End Date Taking? Authorizing Provider  citalopram (CELEXA) 40 MG tablet Take 40 mg by mouth daily.   Yes Historical Provider, MD  hydrochlorothiazide (HYDRODIURIL) 25 MG tablet Take 25 mg by mouth daily.    Yes Historical Provider, MD  HYDROcodone-acetaminophen (NORCO/VICODIN) 5-325 MG per tablet Take 1 tablet by mouth every 4 (four) hours as needed. Patient not taking: Reported on 01/11/2015 12/21/14   Ivery QualeHobson Bryant, PA-C  methocarbamol (ROBAXIN) 500 MG tablet Take 1 tablet (500 mg total) by mouth 3 (three) times daily. Patient not taking: Reported on 01/11/2015 12/21/14   Ivery QualeHobson Bryant, PA-C  mupirocin ointment (BACTROBAN) 2 % Apply small amt to affected area TID x 10 days Patient not taking: Reported on 12/21/2014 11/29/14   Tammi Triplett, PA-C  pantoprazole (PROTONIX) 40 MG tablet Take 1 tablet (40 mg total) by mouth daily. Patient not taking: Reported on 01/11/2015 11/24/14   Burgess AmorJulie Idol, PA-C  sulfamethoxazole-trimethoprim (SEPTRA DS) 800-160 MG per tablet Take 1 tablet by mouth 2 (two) times daily. For 10 days Patient not taking: Reported on 12/21/2014 11/29/14   Severiano Gilbertammi Triplett, PA-C   Physical Exam: Filed Vitals:   01/11/15 1630 01/11/15 1700 01/11/15 1730 01/11/15 1800  BP: 129/71 123/69 137/74 126/70  Pulse: 75 71 64  86  Temp:      TempSrc:      Resp:  Height:      Weight:      SpO2: 97% 100% 99% 100%    Wt Readings from Last 3 Encounters:  01/11/15 103.874 kg (229 lb)  11/29/14 103.874 kg (229 lb)  11/24/14 103.874 kg (229 lb)    General:  Appears calm and comfortable Eyes:  PERRL, normal lids, irises & conjunctiva , no scleral icterus ENT:  grossly normal hearing, lips &  tongue Neck:  no LAD, masses or thyromegaly Cardiovascular:  RRR, no m/r/g. No LE edema. Telemetry:  SR, no arrhythmias  Respiratory:  CTA bilaterally, no w/r/r. Normal respiratory effort. Abdomen:  soft, ntnd NABS, nontender McBurney's point, no masses felt Skin:  no rash or induration seen on limited exam Musculoskeletal:  grossly normal tone BUE/BLE Psychiatric:  grossly normal mood and affect, speech fluent and appropriate Neurologic:  grossly non-focal.          Labs on Admission:  Basic Metabolic Panel:  Recent Labs Lab 01/11/15 1420  NA 139  K 3.2*  CL 105  CO2 28  GLUCOSE 111*  BUN 12  CREATININE 0.84  CALCIUM 9.1   Liver Function Tests:  Recent Labs Lab 01/11/15 1420  AST 353*  ALT 234*  ALKPHOS 244*  BILITOT 0.7  PROT 7.8  ALBUMIN 4.1    Recent Labs Lab 01/11/15 1420  LIPASE 22   No results for input(s): AMMONIA in the last 168 hours. CBC:  Recent Labs Lab 01/11/15 1420  WBC 9.1  NEUTROABS 5.3  HGB 12.2  HCT 37.4  MCV 90.3  PLT 354   Cardiac Enzymes: No results for input(s): CKTOTAL, CKMB, CKMBINDEX, TROPONINI in the last 168 hours.  BNP (last 3 results) No results for input(s): BNP in the last 8760 hours.  ProBNP (last 3 results) No results for input(s): PROBNP in the last 8760 hours.  CBG: No results for input(s): GLUCAP in the last 168 hours.  Radiological Exams on Admission: Ct Abdomen Pelvis W Contrast  01/11/2015   CLINICAL DATA:  Acute epigastric pain and back pain for 2 weeks with nausea and vomiting. History of cholecystectomy and hysterectomy.  EXAM: CT ABDOMEN AND PELVIS WITH CONTRAST  TECHNIQUE: Multidetector CT imaging of the abdomen and pelvis was performed using the standard protocol following bolus administration of intravenous contrast.  CONTRAST:  OMNIPAQUE IOHEXOL 300 MG/ML  SOLN  COMPARISON:  02/26/2012  FINDINGS: Lower chest: Clear lung bases. Normal heart size. No pericardial or pleural effusion.  Abdomen:  Prior cholecystectomy. Increased diffuse intra and extrahepatic biliary dilatation compared to 02/26/2012. Biliary dilatation extends to the ampulla. Distal common bile duct diameter is 14 mm, image 26. No associated pancreatic duct dilatation.  No other focal hepatic abnormality.  Pancreas, spleen, adrenal glands, and kidneys are within normal limits for age and demonstrate no acute process.  Negative for bowel obstruction, dilatation, ileus, or free air. Postop changes throughout the anterior abdomen as before.  No abdominal free fluid, fluid collection, hemorrhage, abscess or adenopathy.  Negative for aneurysm.  No retroperitoneal abnormality.  Normal appendix in the right lower quadrant.  Pelvis: Trace pelvic free fluid, likely physiologic. Urinary bladder unremarkable. No pelvic hemorrhage, hematoma, abscess, adenopathy, inguinal abnormality, or hernia. Uterus and adnexal normal in size.  Endplate degenerative changes of the lumbar spine. No acute compression fracture or acute osseous finding.  IMPRESSION: Increased intra and extrahepatic biliary dilatation. A component  of this is likely secondary to prior cholecystectomy. Distal CBD obstruction or an ampullary abnormality cannot be excluded. Consider further evaluation with ERCP/MRCP and correlation with liver function panel.  No associated pancreatic abnormality or pancreatic ductal dilatation.  Stable postoperative findings in the abdomen  No other acute finding or abscess.   Electronically Signed   By: Judie Petit.  Shick M.D.   On: 01/11/2015 15:43       Assessment/Plan Principal Problem:   Elevated liver enzymes Active Problems:   Essential hypertension   Hypokalemia   Depression   History of cholecystectomy   Limited liver enzymes: AST 353 ALT 234 , alkaline phosphatase 244. Associated with distal common bile duct obstruction or ampullary abnormality is noted on the CT. Patient is status post cholecystectomy. No mass noted on CT but there is  concern for malignancy. Dr.Rehman consult by the emergency room and will see the patient in the a.m. for possible ERCP. - Admit to MedSurg - Clear liquid diet until midnight - Coags - CMP in the a.m. - Follow-up GI recommendations - Urinalysis  Hypertension: Normotensive - Continue hydrochlorothiazide  Hypokalemia: 3.2 - Mag - KCl 10 mEq 5  Depression: - Continue Celexa  Code Status: FUll  DVT Prophylaxis: SCD Family Communication: None Disposition Plan: Pending ERCP and full workup  MERRELL, DAVID Shela Commons, MD Family Medicine [facility] [email or web address] Password [email or web address]

## 2015-01-11 NOTE — ED Notes (Signed)
MD at bedside. 

## 2015-01-12 ENCOUNTER — Encounter (HOSPITAL_COMMUNITY): Payer: Self-pay | Admitting: *Deleted

## 2015-01-12 ENCOUNTER — Inpatient Hospital Stay (HOSPITAL_COMMUNITY): Payer: Medicaid Other | Admitting: Anesthesiology

## 2015-01-12 ENCOUNTER — Inpatient Hospital Stay (HOSPITAL_COMMUNITY): Payer: Medicaid Other

## 2015-01-12 ENCOUNTER — Encounter (HOSPITAL_COMMUNITY)
Admission: EM | Disposition: A | Discharge status: Home / Self Care | Payer: Self-pay | Source: Home / Self Care | Attending: Internal Medicine

## 2015-01-12 DIAGNOSIS — Z9889 Other specified postprocedural states: Secondary | ICD-10-CM

## 2015-01-12 DIAGNOSIS — K8051 Calculus of bile duct without cholangitis or cholecystitis with obstruction: Secondary | ICD-10-CM

## 2015-01-12 DIAGNOSIS — K831 Obstruction of bile duct: Secondary | ICD-10-CM

## 2015-01-12 DIAGNOSIS — R748 Abnormal levels of other serum enzymes: Secondary | ICD-10-CM

## 2015-01-12 DIAGNOSIS — R1013 Epigastric pain: Secondary | ICD-10-CM

## 2015-01-12 DIAGNOSIS — R1012 Left upper quadrant pain: Secondary | ICD-10-CM

## 2015-01-12 HISTORY — PX: SPHINCTEROTOMY: SHX5544

## 2015-01-12 HISTORY — PX: ERCP: SHX5425

## 2015-01-12 LAB — COMPREHENSIVE METABOLIC PANEL
ALK PHOS: 302 U/L — AB (ref 39–117)
ALT: 653 U/L — ABNORMAL HIGH (ref 0–35)
AST: 617 U/L — AB (ref 0–37)
Albumin: 3.8 g/dL (ref 3.5–5.2)
Anion gap: 8 (ref 5–15)
BILIRUBIN TOTAL: 0.6 mg/dL (ref 0.3–1.2)
BUN: 8 mg/dL (ref 6–23)
CO2: 25 mmol/L (ref 19–32)
CREATININE: 0.74 mg/dL (ref 0.50–1.10)
Calcium: 8.9 mg/dL (ref 8.4–10.5)
Chloride: 106 mmol/L (ref 96–112)
GFR calc Af Amer: >90 mL/min (ref >90)
GFR calc non Af Amer: >90 mL/min (ref >90)
Glucose, Bld: 91 mg/dL (ref 70–99)
POTASSIUM: 3.6 mmol/L (ref 3.5–5.1)
SODIUM: 139 mmol/L (ref 135–145)
Total Protein: 7.4 g/dL (ref 6.0–8.3)

## 2015-01-12 LAB — PROTIME-INR
INR: 1.09 (ref 0.00–1.49)
Prothrombin Time: 14.2 seconds (ref 11.6–15.2)

## 2015-01-12 LAB — APTT: APTT: 21 s — AB (ref 24–37)

## 2015-01-12 SURGERY — ERCP, WITH INTERVENTION IF INDICATED
Anesthesia: General

## 2015-01-12 MED ORDER — ONDANSETRON HCL 4 MG/2ML IJ SOLN
INTRAMUSCULAR | Status: DC | PRN
  Administered 2015-01-12: 4 mg via INTRAVENOUS

## 2015-01-12 MED ORDER — ROCURONIUM BROMIDE 100 MG/10ML IV SOLN
INTRAVENOUS | Status: DC | PRN
  Administered 2015-01-12: 35 mg via INTRAVENOUS

## 2015-01-12 MED ORDER — FENTANYL CITRATE 0.05 MG/ML IJ SOLN
INTRAMUSCULAR | Status: DC | PRN
  Administered 2015-01-12 (×3): 50 ug via INTRAVENOUS

## 2015-01-12 MED ORDER — MIDAZOLAM HCL 5 MG/5ML IJ SOLN
INTRAMUSCULAR | Status: DC | PRN
  Administered 2015-01-12 (×2): 2 mg via INTRAVENOUS

## 2015-01-12 MED ORDER — METOPROLOL TARTRATE 1 MG/ML IV SOLN
INTRAVENOUS | Status: AC
  Filled 2015-01-12: qty 5

## 2015-01-12 MED ORDER — GLUCAGON HCL RDNA (DIAGNOSTIC) 1 MG IJ SOLR
INTRAMUSCULAR | Status: AC
  Filled 2015-01-12: qty 2

## 2015-01-12 MED ORDER — MIDAZOLAM HCL 2 MG/2ML IJ SOLN
INTRAMUSCULAR | Status: AC
  Filled 2015-01-12: qty 2

## 2015-01-12 MED ORDER — GLUCAGON HCL RDNA (DIAGNOSTIC) 1 MG IJ SOLR
INTRAMUSCULAR | Status: DC | PRN
  Administered 2015-01-12 (×2): 0.25 mg via INTRAVENOUS

## 2015-01-12 MED ORDER — SIMETHICONE 40 MG/0.6ML PO SUSP
ORAL | Status: DC | PRN
  Administered 2015-01-12: 1000 mL

## 2015-01-12 MED ORDER — CITALOPRAM HYDROBROMIDE 20 MG PO TABS
40.0000 mg | ORAL_TABLET | Freq: Every day | ORAL | Status: DC
  Administered 2015-01-12 – 2015-01-13 (×2): 40 mg via ORAL
  Filled 2015-01-12 (×2): qty 2

## 2015-01-12 MED ORDER — LIDOCAINE HCL 1 % IJ SOLN
INTRAMUSCULAR | Status: DC | PRN
  Administered 2015-01-12: 30 mg via INTRADERMAL

## 2015-01-12 MED ORDER — PROPOFOL 10 MG/ML IV BOLUS
INTRAVENOUS | Status: AC
  Filled 2015-01-12: qty 20

## 2015-01-12 MED ORDER — LACTATED RINGERS IV SOLN
INTRAVENOUS | Status: DC
Start: 2015-01-12 — End: 2015-01-12
  Administered 2015-01-12: 15:00:00 via INTRAVENOUS

## 2015-01-12 MED ORDER — FENTANYL CITRATE 0.05 MG/ML IJ SOLN
INTRAMUSCULAR | Status: AC
  Filled 2015-01-12: qty 2

## 2015-01-12 MED ORDER — SODIUM CHLORIDE 0.9 % IV SOLN
INTRAVENOUS | Status: DC | PRN
  Administered 2015-01-12: 100 mL

## 2015-01-12 MED ORDER — HYDROCHLOROTHIAZIDE 25 MG PO TABS
25.0000 mg | ORAL_TABLET | Freq: Every day | ORAL | Status: DC
  Administered 2015-01-12 – 2015-01-13 (×2): 25 mg via ORAL
  Filled 2015-01-12 (×2): qty 1

## 2015-01-12 MED ORDER — SODIUM CHLORIDE 0.9 % IV SOLN
INTRAVENOUS | Status: AC
  Filled 2015-01-12: qty 50

## 2015-01-12 MED ORDER — GLYCOPYRROLATE 0.2 MG/ML IJ SOLN
INTRAMUSCULAR | Status: DC | PRN
  Administered 2015-01-12: 0.4 mg via INTRAVENOUS
  Administered 2015-01-12: 0.2 mg via INTRAVENOUS

## 2015-01-12 MED ORDER — NEOSTIGMINE METHYLSULFATE 10 MG/10ML IV SOLN
INTRAVENOUS | Status: DC | PRN
  Administered 2015-01-12: 1 mg via INTRAVENOUS
  Administered 2015-01-12: 2 mg via INTRAVENOUS

## 2015-01-12 MED ORDER — PROPOFOL 10 MG/ML IV BOLUS
INTRAVENOUS | Status: DC | PRN
  Administered 2015-01-12: 150 mg via INTRAVENOUS
  Administered 2015-01-12: 50 mg via INTRAVENOUS

## 2015-01-12 MED ORDER — MENTHOL 3 MG MT LOZG: 1.0000 | LOZENGE | OROMUCOSAL | Status: DC | PRN

## 2015-01-12 MED ORDER — SODIUM CHLORIDE 0.9 % IV SOLN: INTRAVENOUS | Status: DC

## 2015-01-12 SURGICAL SUPPLY — 24 items
BAG HAMPER (MISCELLANEOUS) ×3 IMPLANT
BALLN RETRIEVAL 12X15 (BALLOONS) ×2 IMPLANT
BALLN RETRIEVAL 12X15MM (BALLOONS) ×1
BASKET TRAPEZOID 3X6 (MISCELLANEOUS) IMPLANT
DEVICE INFLATION ENCORE 26 (MISCELLANEOUS) IMPLANT
DEVICE LOCKING W-BIOPSY CAP (MISCELLANEOUS) ×3 IMPLANT
GUIDEWIRE HYDRA JAGWIRE .35 (WIRE) IMPLANT
GUIDEWIRE JAG HINI 025X260CM (WIRE) IMPLANT
KIT CLEAN ENDO COMPLIANCE (KITS) ×3 IMPLANT
KIT ROOM TURNOVER APOR (KITS) ×3 IMPLANT
LUBRICANT JELLY 4.5OZ STERILE (MISCELLANEOUS) ×3 IMPLANT
PAD ARMBOARD 7.5X6 YLW CONV (MISCELLANEOUS) ×3 IMPLANT
PATHFINDER 450CM 0.18 (STENTS) IMPLANT
POSITIONER HEAD 8X9X4 ADT (SOFTGOODS) IMPLANT
SCOPE SPY DS DISPOSABLE (MISCELLANEOUS) IMPLANT
SNARE ROTATE MED OVAL 20MM (MISCELLANEOUS) IMPLANT
SNARE SHORT THROW 13M SML OVAL (MISCELLANEOUS) IMPLANT
SPHINCTEROTOME AUTOTOME .25 (MISCELLANEOUS) IMPLANT
SPHINCTEROTOME HYDRATOME 44 (MISCELLANEOUS) ×3 IMPLANT
SYR 50ML LL SCALE MARK (SYRINGE) ×6 IMPLANT
SYSTEM CONTINUOUS INJECTION (MISCELLANEOUS) ×3 IMPLANT
WALLSTENT METAL COVERED 10X60 (STENTS) IMPLANT
WALLSTENT METAL COVERED 10X80 (STENTS) IMPLANT
WATER STERILE IRR 1000ML POUR (IV SOLUTION) ×6 IMPLANT

## 2015-01-12 NOTE — Op Note (Signed)
ERCP PROCEDURE REPORT  PATIENT:  Janet Mcguire  MR#:  [id number] Birthdate:  05/24/1961, 54 y.o., female Endoscopist:  Dr. Malissa HippoNajeeb U. Monalisa Bayless, MD Referred By:  Dr. Gust BroomsEstella Y. Ardyth HarpsHernandez, MD Procedure Date: 01/12/2015  Procedure:   ERCP with biliary sphincterotomy.    Indications:  Patient is 54 year old African-American female who presents with epigastric and left upper quadrant abdominal pain with nausea and vomiting and noted to have elevated transaminases and alkaline phosphatase and markedly dilated biliary system. Patient states she's had this pain off and on for several months but lately had gotten worse. She is undergoing diagnostic and therapeutic ERCP.            Informed Consent:  The risks, benefits, limitations, alternatives, and mponderable have been reviewed with the patient. I specifically discussed a 1 in 10 chance of pancreatitis, reaction to medications, bleeding, perforation and the possibility of a failed ERCP. Potential for sphincterotomy and stent placement also reviewed. Questions have been answered. All parties agreeable.  Please see history & physical in medical record for more information.  Medications:  Gen. endotracheal anesthesia  Please see anesthesia record for complete details  Description of procedure:  Procedure performed in the OR. The patient was placed under anesthesia, intubated, and turned into semipermanent position. Therapeutic Pentax video duodenoscope passed through the oropharynx without any difficulty into the esophagus, stomach, and across the pylorus and pull, and descending duodenum.  Rx 44 autotome autotome and 035 hydrojag wire was used for cannulation. CBD cannulated without difficulty and filled with dilute contrast material.  Findings:  Normal appearing ampulla water with tiny orifice. CBD and CHD dilated with distal tapering but no filling defects noted. Maximal CHD diameter 16mm. Intrahepatic biliary radicles were also dilated. Large cystic  duct remnant overlying CHD without filling defects. Biliary sphincterotomy performed with take decompression office biliary system. Stone balloon dilator with diameter of 11 mm trolled through the duct and across papilla. Pancreatic duct was not related or filled with contrast.   Therapeutic/Diagnostic Maneuvers Performed:  Biliary sphincterotomy performed with free flow of bile into duodenum. Balloon stone extractor trolled through the bile duct and across sphincterotomy.  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 Biliary sphincterotomy performed with free flow of bile into duodenum.  Recommendations:  Clear liquids today. No aspirin or anticoagulants for 72 hours. CBC, comprehensive chemistry panel and serum amylase in a.m.  Eura Mccauslin U  01/12/2015  4:30 PM  CC: Dr. Evlyn CourierHILL,GERALD K, MD & Dr. Bonnetta BarryNo ref. provider found

## 2015-01-12 NOTE — Anesthesia Preprocedure Evaluation (Addendum)
Anesthesia Evaluation  Patient identified by MRN, date of birth, ID band Patient awake    Reviewed: Allergy & Precautions, NPO status , Patient's Chart, lab work & pertinent test results  Airway Mallampati: I  TM Distance: >3 FB Neck ROM: full    Dental  (+) Teeth Intact   Pulmonary  breath sounds clear to auscultation        Cardiovascular Exercise Tolerance: Good hypertension, Pt. on medications Rhythm:Regular Rate:Normal     Neuro/Psych Anxiety Depression    GI/Hepatic (+)     substance abuse  marijuana use,   Endo/Other    Renal/GU      Musculoskeletal   Abdominal (+) + obese,  Abdomen: soft. Bowel sounds: normal.  Peds  Hematology   Anesthesia Other Findings   Reproductive/Obstetrics                          Anesthesia Physical Anesthesia Plan  ASA: II and emergent  Anesthesia Plan: General   Post-op Pain Management:    Induction: Intravenous  Airway Management Planned: Oral ETT  Additional Equipment:   Intra-op Plan:   Post-operative Plan: Extubation in OR  Informed Consent:   Dental advisory given  Plan Discussed with: Anesthesiologist and CRNA  Anesthesia Plan Comments:         Anesthesia Quick Evaluation

## 2015-01-12 NOTE — Progress Notes (Signed)
Received report from ChignikGina, RN in ENDO. Patient arrived to the unit and was alert and oriented and had no complaints. VS were within normal limits. Will continue to monitor patient at this time.

## 2015-01-12 NOTE — Consult Note (Signed)
Reason for Consult: elevated liver enzymes Referring Physician: Hospitalist  Janet Mcguire is an 54 y.o. female.  HPI: Admitted thru the ED yesterday.  She tells me she had pain in her epigastric and left upper quadrant. She had the pain for a week. Rates pain 6/10. The pain radiated into her left back. She had nausea and vomiting associated with her symptoms. There was no fever as far as she knows. She says she has had this pain before. Similar episode around the 1st of February. Hx of cholecystectomy many years for hx of gallstones.  She says she has had about a 15 pound weight loss over the past month.  Weight 12/04/2013 246lb, This admission weight 224. Her appetite has been good.  BMs are normal. BM usually every day. No melena or BRRB. No NSAIDs. Takes tylenol rarely Noted in ED she has elevated transaminases and dilated CBD. Has had 4 run of potassium. (K 3.2) States she use to be a heavy drinker but has not drank heavy in a couple of years. Occasionally smokes marijuana. Does not smoke cigarettes.   Past Medical History  Diagnosis Date  . Depression   . Anxiety   . Hypertension     Past Surgical History  Procedure Laterality Date  . Cholecystectomy    . Tubal ligation      Family History  Problem Relation Age of Onset  . Diabetes Mother     Social History:  reports that she has never smoked. She does not have any smokeless tobacco history on file. She reports that she does not drink alcohol or use illicit drugs.  Allergies:  Allergies  Allergen Reactions  . Ibuprofen     Told by her doctor not to take it    Medications: I have reviewed the patient's current medications.  Results for orders placed or performed during the hospital encounter of 01/11/15 (from the past 48 hour(s))  Comprehensive metabolic panel     Status: Abnormal   Collection Time: 01/11/15  2:20 PM  Result Value Ref Range   Sodium 139 135 - 145 mmol/L   Potassium 3.2 (L) 3.5 - 5.1 mmol/L   Chloride 105 96 - 112 mmol/L   CO2 28 19 - 32 mmol/L   Glucose, Bld 111 (H) 70 - 99 mg/dL   BUN 12 6 - 23 mg/dL   Creatinine, Ser 0.84 0.50 - 1.10 mg/dL   Calcium 9.1 8.4 - 10.5 mg/dL   Total Protein 7.8 6.0 - 8.3 g/dL   Albumin 4.1 3.5 - 5.2 g/dL   AST 353 (H) 0 - 37 U/L   ALT 234 (H) 0 - 35 U/L   Alkaline Phosphatase 244 (H) 39 - 117 U/L   Total Bilirubin 0.7 0.3 - 1.2 mg/dL   GFR calc non Af Amer 79 (L) >90 mL/min   GFR calc Af Amer >90 >90 mL/min    Comment: (NOTE) The eGFR has been calculated using the CKD EPI equation. This calculation has not been validated in all clinical situations. eGFR's persistently <90 mL/min signify possible Chronic Kidney Disease.    Anion gap 6 5 - 15  CBC with Differential/Platelet     Status: None   Collection Time: 01/11/15  2:20 PM  Result Value Ref Range   WBC 9.1 4.0 - 10.5 K/uL   RBC 4.14 3.87 - 5.11 MIL/uL   Hemoglobin 12.2 12.0 - 15.0 g/dL   HCT 37.4 36.0 - 46.0 %   MCV 90.3 78.0 - 100.0 fL  MCH 29.5 26.0 - 34.0 pg   MCHC 32.6 30.0 - 36.0 g/dL   RDW 14.9 11.5 - 15.5 %   Platelets 354 150 - 400 K/uL   Neutrophils Relative % 57 43 - 77 %   Neutro Abs 5.3 1.7 - 7.7 K/uL   Lymphocytes Relative 29 12 - 46 %   Lymphs Abs 2.6 0.7 - 4.0 K/uL   Monocytes Relative 9 3 - 12 %   Monocytes Absolute 0.8 0.1 - 1.0 K/uL   Eosinophils Relative 4 0 - 5 %   Eosinophils Absolute 0.3 0.0 - 0.7 K/uL   Basophils Relative 1 0 - 1 %   Basophils Absolute 0.1 0.0 - 0.1 K/uL  Lipase, blood     Status: None   Collection Time: 01/11/15  2:20 PM  Result Value Ref Range   Lipase 22 11 - 59 U/L  Protime-INR     Status: None   Collection Time: 01/12/15  5:12 AM  Result Value Ref Range   Prothrombin Time 14.2 11.6 - 15.2 seconds   INR 1.09 0.00 - 1.49  APTT     Status: Abnormal   Collection Time: 01/12/15  5:12 AM  Result Value Ref Range   aPTT 21 (L) 24 - 37 seconds    Ct Abdomen Pelvis W Contrast  01/11/2015   CLINICAL DATA:  Acute epigastric pain  and back pain for 2 weeks with nausea and vomiting. History of cholecystectomy and hysterectomy.  EXAM: CT ABDOMEN AND PELVIS WITH CONTRAST  TECHNIQUE: Multidetector CT imaging of the abdomen and pelvis was performed using the standard protocol following bolus administration of intravenous contrast.  CONTRAST:  135m OMNIPAQUE IOHEXOL 300 MG/ML  SOLN  COMPARISON:  02/26/2012  FINDINGS: Lower chest: Clear lung bases. Normal heart size. No pericardial or pleural effusion.  Abdomen: Prior cholecystectomy. Increased diffuse intra and extrahepatic biliary dilatation compared to 02/26/2012. Biliary dilatation extends to the ampulla. Distal common bile duct diameter is 14 mm, image 26. No associated pancreatic duct dilatation.  No other focal hepatic abnormality.  Pancreas, spleen, adrenal glands, and kidneys are within normal limits for age and demonstrate no acute process.  Negative for bowel obstruction, dilatation, ileus, or free air. Postop changes throughout the anterior abdomen as before.  No abdominal free fluid, fluid collection, hemorrhage, abscess or adenopathy.  Negative for aneurysm.  No retroperitoneal abnormality.  Normal appendix in the right lower quadrant.  Pelvis: Trace pelvic free fluid, likely physiologic. Urinary bladder unremarkable. No pelvic hemorrhage, hematoma, abscess, adenopathy, inguinal abnormality, or hernia. Uterus and adnexal normal in size.  Endplate degenerative changes of the lumbar spine. No acute compression fracture or acute osseous finding.  IMPRESSION: Increased intra and extrahepatic biliary dilatation. A component of this is likely secondary to prior cholecystectomy. Distal CBD obstruction or an ampullary abnormality cannot be excluded. Consider further evaluation with ERCP/MRCP and correlation with liver function panel.  No associated pancreatic abnormality or pancreatic ductal dilatation.  Stable postoperative findings in the abdomen  No other acute finding or abscess.    Electronically Signed   By: MJerilynn Mages  Shick M.D.   On: 01/11/2015 15:43    Review of Systems   Blood pressure 117/59, pulse 62, temperature 98.4 F (36.9 C), temperature source Oral, resp. rate 14, height '6\' 1"'  (1.854 m), weight 224 lb (101.606 kg), SpO2 99 %. Physical Exam Alert and oriented. Skin warm and dry. Oral mucosa is moist.   . Sclera anicteric, conjunctivae is pink. Thyroid not enlarged. No cervical  lymphadenopathy. Lungs clear. Heart regular rate and rhythm.  Abdomen is soft. Bowel sounds are positive. No hepatomegaly. No abdominal masses felt. No tenderness to her abdomen. She does have queasy feeling when I touch her epigastric region.   No edema to lower extremities.   Assessment/Plan: Elevated liver enyzymes and dilated CBD. ? Etiology. Keep patient NPO. Plan: ERCP.   SETZER,TERRI W 01/12/2015, 7:46 AM   GI attending note; Please refer to my separate note from earlier today.

## 2015-01-12 NOTE — Anesthesia Postprocedure Evaluation (Signed)
  Anesthesia Post-op Note  Patient: Janet Mcguire  Procedure(s) Performed: Procedure(s) with comments: ENDOSCOPIC RETROGRADE CHOLANGIOPANCREATOGRAPHY (ERCP)  (N/A) - Balloon Extraction SPHINCTEROTOMY (N/A)  Patient Location: PACU  Anesthesia Type:General  Level of Consciousness: awake, alert , oriented and patient cooperative  Airway and Oxygen Therapy: Patient Spontanous Breathing  Post-op Pain: none  Post-op Assessment: Post-op Vital signs reviewed, Patient's Cardiovascular Status Stable, Respiratory Function Stable, Patent Airway, No signs of Nausea or vomiting and Pain level controlled  Post-op Vital Signs: Reviewed and stable  Last Vitals:  Filed Vitals:   01/12/15 1525  BP: 131/80  Pulse:   Temp:   Resp: 21    Complications: No apparent anesthesia complications

## 2015-01-12 NOTE — Transfer of Care (Signed)
Immediate Anesthesia Transfer of Care Note  Patient: Finesse Brazill  Procedure(s) Performed: Procedure(s) with comments: ENDOSCOPIC RETROGRADE CHOLANGIOPANCREATOGRAPHY (ERCP)  (N/A) - Balloon Extraction SPHINCTEROTOMY (N/A)  Patient Location: PACU  Anesthesia Type:General  Level of Consciousness: awake, alert , oriented and patient cooperative  Airway & Oxygen Therapy: Patient Spontanous Breathing and Patient connected to face mask oxygen  Post-op Assessment: Report given to RN, Post -op Vital signs reviewed and stable and Patient moving all extremities  Post vital signs: Reviewed and stable  Last Vitals:  Filed Vitals:   01/12/15 1525  BP: 131/80  Pulse:   Temp:   Resp: 21    Complications: No apparent anesthesia complications

## 2015-01-12 NOTE — Care Management Utilization Note (Signed)
UR completed 

## 2015-01-12 NOTE — Progress Notes (Signed)
[facility] PROGRESS NOTE  Janet Mcguire ZOX:[id number] DOB: 26-Sep-1961 DOA: 01/11/2015 PCP: Evlyn Courier, MD  Assessment/Plan: Transaminitis -With dilated biliary ducts on CT scan. -Could possibly have a distal stone/stenosis or a papillary tumor. -LFTs have doubled overnight. -Seen by GI with plans for an ERCP later today for diagnosis and treatment. -Will continue to follow and monitor LFTs. -She is s/p cholecystectomy.  HTN -Well controlled -Continue HCTZ.  Hypokalemia -Repleted  Depression -Continue celexa  Code Status: Full Code Family Communication: patient only  Disposition Plan: Home when ready   Consultants:  GI, Dr. Karilyn Cota   Antibiotics:  None   Subjective: Still with abdominal pain  Objective: Filed Vitals:   01/12/15 1510 01/12/15 1515 01/12/15 1520 01/12/15 1525  BP: 140/79 148/82 131/76 131/80  Pulse:      Temp:      TempSrc:      Resp: Height:      Weight:      SpO2: 100% 100% 96% 98%    Intake/Output Summary (Last 24 hours) at 01/12/15 1555 Last data filed at 01/12/15 1525  Gross per 24 hour  Intake    200 ml  Output      0 ml  Net    200 ml   Filed Weights   01/11/15 1412 01/12/15 0021 01/12/15 1446  Weight: 103.874 kg (229 lb) 101.606 kg (224 lb) 103.874 kg (229 lb)    Exam:   General:  AA Ox3  Cardiovascular: RRR  Respiratory: CTA B  Abdomen: S/ND/+BS  Extremities: no C/C/E   Neurologic:  Non-focal  Data Reviewed: Basic Metabolic Panel:  Recent Labs Lab 01/11/15 1420 01/12/15 0512  NA 139 139  K 3.2* 3.6  CL 105 106  CO2 28 25  GLUCOSE 111* 91  BUN 12 8  CREATININE 0.84 0.74  CALCIUM 9.1 8.9   Liver Function Tests:  Recent Labs Lab 01/11/15 1420 01/12/15 0512  AST 353* 617*  ALT 234* 653*  ALKPHOS 244* 302*  BILITOT 0.7 0.6  PROT 7.8 7.4  ALBUMIN 4.1 3.8    Recent Labs Lab 01/11/15 1420  LIPASE 22   No results for input(s): AMMONIA in the last 168  hours. CBC:  Recent Labs Lab 01/11/15 1420  WBC 9.1  NEUTROABS 5.3  HGB 12.2  HCT 37.4  MCV 90.3  PLT 354   Cardiac Enzymes: No results for input(s): CKTOTAL, CKMB, CKMBINDEX, TROPONINI in the last 168 hours. BNP (last 3 results) No results for input(s): BNP in the last 8760 hours.  ProBNP (last 3 results) No results for input(s): PROBNP in the last 8760 hours.  CBG: No results for input(s): GLUCAP in the last 168 hours.  No results found for this or any previous visit (from the past 240 hour(s)).   Studies: Ct Abdomen Pelvis W Contrast  01/11/2015   CLINICAL DATA:  Acute epigastric pain and back pain for 2 weeks with nausea and vomiting. History of cholecystectomy and hysterectomy.  EXAM: CT ABDOMEN AND PELVIS WITH CONTRAST  TECHNIQUE: Multidetector CT imaging of the abdomen and pelvis was performed using the standard protocol following bolus administration of intravenous contrast.  CONTRAST:  OMNIPAQUE IOHEXOL 300 MG/ML  SOLN  COMPARISON:  02/26/2012  FINDINGS: Lower chest: Clear lung bases. Normal heart size. No pericardial or pleural effusion.  Abdomen: Prior cholecystectomy. Increased diffuse intra and extrahepatic biliary dilatation compared to 02/26/2012. Biliary dilatation extends to the ampulla. Distal common bile duct diameter is 14  mm, image 26. No associated pancreatic duct dilatation.  No other focal hepatic abnormality.  Pancreas, spleen, adrenal glands, and kidneys are within normal limits for age and demonstrate no acute process.  Negative for bowel obstruction, dilatation, ileus, or free air. Postop changes throughout the anterior abdomen as before.  No abdominal free fluid, fluid collection, hemorrhage, abscess or adenopathy.  Negative for aneurysm.  No retroperitoneal abnormality.  Normal appendix in the right lower quadrant.  Pelvis: Trace pelvic free fluid, likely physiologic. Urinary bladder unremarkable. No pelvic hemorrhage, hematoma, abscess, adenopathy,  inguinal abnormality, or hernia. Uterus and adnexal normal in size.  Endplate degenerative changes of the lumbar spine. No acute compression fracture or acute osseous finding.  IMPRESSION: Increased intra and extrahepatic biliary dilatation. A component of this is likely secondary to prior cholecystectomy. Distal CBD obstruction or an ampullary abnormality cannot be excluded. Consider further evaluation with ERCP/MRCP and correlation with liver function panel.  No associated pancreatic abnormality or pancreatic ductal dilatation.  Stable postoperative findings in the abdomen  No other acute finding or abscess.   Electronically Signed   By: Judie PetitM.  Shick M.D.   On: 01/11/2015 15:43    Scheduled Meds: . [MAR Hold] citalopram  40 mg Oral Daily  . glucagon (human recombinant)      . [MAR Hold] hydrochlorothiazide  25 mg Oral Daily  . [MAR Hold] senna  1 tablet Oral BID  . sodium chloride       Continuous Infusions: . sodium chloride 1 mL (01/11/15 2321)  . sodium chloride    . lactated ringers 10 mL/hr at 01/12/15 1450    Principal Problem:   Elevated liver enzymes Active Problems:   Essential hypertension   Hypokalemia   Depression   History of cholecystectomy    Time spent: 35 minutes. Greater than 50% of this time was spent in direct contact with the patient coordinating care.    Chaya JanHERNANDEZ ACOSTA,ESTELA  [facility] Pager [phone]  If 7PM-7AM, please contact night-coverage at [email or web address], password [email or web address] 01/12/2015, 3:55 PM  LOS: 1 day

## 2015-01-12 NOTE — Anesthesia Procedure Notes (Signed)
Procedure Name: Intubation Date/Time: 01/12/2015 3:39 PM Performed by: Despina HiddenIDACAVAGE, [place] J Pre-anesthesia Checklist: Patient being monitored, Suction available, Emergency Drugs available and Patient identified Patient Re-evaluated:Patient Re-evaluated prior to inductionOxygen Delivery Method: Circle system utilized Preoxygenation: Pre-oxygenation with 100% oxygen Intubation Type: IV induction and Cricoid Pressure applied Ventilation: Mask ventilation without difficulty and Oral airway inserted - appropriate to patient size Laryngoscope Size: Mac and 3 Grade View: Grade II Tube type: Oral Tube size: 6.0 mm Number of attempts: 2 (7.0 ETT would not pass easily) Airway Equipment and Method: Stylet Placement Confirmation: ETT inserted through vocal cords under direct vision and positive ETCO2 Secured at: 22 cm Tube secured with: Tape

## 2015-01-12 NOTE — Consult Note (Signed)
Patient interviewed and examined. Current and prior abdominopelvic CT reviewed with Dr. Nilda Simmeraniel Intriken. Please refer to Ms. Setzer's note for details of consultation. Patient presents with several month history of epigastric and left upper quadrant abdominal pain and has elevated transaminases which have more than doubled since last night. CT reveals dilated biliary system which is new since prior study of 2013. No obvious stones identified. She could have a small ampullary tumor or papillary stenosis. She will need diagnostic and therapeutic ERCP. She will need biliary sphincterotomy and may also need biliary and pancreatic stenting. Pancreatic stenting is performed for the for prophylaxis. Procedure and risks reviewed with the patient and she is agreeable. INR is normal. Will proceed with ERCP today.

## 2015-01-12 NOTE — Care Management Note (Signed)
    Page 1 of 1   01/12/2015     12:03:36 PM CARE MANAGEMENT NOTE 01/12/2015  Patient:  Janet Mcguire,Janet Mcguire   Account Number:  [id number]  Date Initiated:  01/12/2015  Documentation initiated by:  Kathyrn SheriffHILDRESS,JESSICA  Subjective/Objective Assessment:   Pt is from home, lives with neice. Pt is independent at baseline. Pt has no HH services of DME's. Pt plans to discharge home with self care. No CM needs.     Action/Plan:   Anticipated DC Date:  01/14/2015   Anticipated DC Plan:  HOME/SELF CARE      DC Planning Services  CM consult      Choice offered to / List presented to:             Status of service:  Completed, signed off Medicare Important Message given?   (If response is "NO", the following Medicare IM given date fields will be blank) Date Medicare IM given:   Medicare IM given by:   Date Additional Medicare IM given:   Additional Medicare IM given by:    Discharge Disposition:  HOME/SELF CARE  Per UR Regulation:  Reviewed for med. necessity/level of care/duration of stay  If discussed at Long Length of Stay Meetings, dates discussed:    Comments:  01/12/2015 1200 Kathyrn SheriffJessica Childress, RN, MSN, CM

## 2015-01-13 LAB — CBC
HCT: 38.6 % (ref 36.0–46.0)
Hemoglobin: 12.3 g/dL (ref 12.0–15.0)
MCH: 29 pg (ref 26.0–34.0)
MCHC: 31.9 g/dL (ref 30.0–36.0)
MCV: 91 fL (ref 78.0–100.0)
PLATELETS: 360 10*3/uL (ref 150–400)
RBC: 4.24 MIL/uL (ref 3.87–5.11)
RDW: 15.2 % (ref 11.5–15.5)
WBC: 6.2 10*3/uL (ref 4.0–10.5)

## 2015-01-13 LAB — COMPREHENSIVE METABOLIC PANEL
ALK PHOS: 258 U/L — AB (ref 39–117)
ALT: 451 U/L — AB (ref 0–35)
AST: 223 U/L — ABNORMAL HIGH (ref 0–37)
Albumin: 3.7 g/dL (ref 3.5–5.2)
Anion gap: 6 (ref 5–15)
BILIRUBIN TOTAL: 0.7 mg/dL (ref 0.3–1.2)
BUN: 7 mg/dL (ref 6–23)
CO2: 27 mmol/L (ref 19–32)
Calcium: 9.1 mg/dL (ref 8.4–10.5)
Chloride: 106 mmol/L (ref 96–112)
Creatinine, Ser: 0.77 mg/dL (ref 0.50–1.10)
GFR calc Af Amer: >90 mL/min (ref >90)
Glucose, Bld: 110 mg/dL — ABNORMAL HIGH (ref 70–99)
Potassium: 3.6 mmol/L (ref 3.5–5.1)
SODIUM: 139 mmol/L (ref 135–145)
Total Protein: 7.4 g/dL (ref 6.0–8.3)

## 2015-01-13 LAB — AMYLASE: AMYLASE: 42 U/L (ref 0–105)

## 2015-01-13 NOTE — Discharge Summary (Signed)
Physician Discharge Summary  Janet Mcguire UJW:[id number] DOB: 12-24-1960 DOA: 01/11/2015  PCP: Evlyn Courier, MD  Admit date: 01/11/2015 Discharge date: 01/13/2015  Time spent: 45 minutes  Recommendations for Outpatient Follow-up:  -Will be discharged home today. -Advised to follow up  With Dr. Karilyn Cota. His office will schedule appointment.  Discharge Diagnoses:  Principal Problem:   Elevated liver enzymes Active Problems:   Essential hypertension   Hypokalemia   Depression   History of cholecystectomy   Discharge Condition: Stable and improved  Filed Weights   01/11/15 1412 01/12/15 0021 01/12/15 1446  Weight: 103.874 kg (229 lb) 101.606 kg (224 lb) 103.874 kg (229 lb)    History of present illness:  Janet Mcguire is a 54 y.o. female  One-week history of epigastric to periumbilical abdominal pain with radiation to the back. Associated with nausea and urinary frequency. Denies dysuria, fever, vomiting, diarrhea, chest pain, shortness of breath, palpitations. Patient has had similar episodes of this pain in the distant past. Abdominal surgeries consistent with C-section, tubal ligation and cholecystectomy. Patient reports occasional alcohol use. Nothing alleviates or worsens the pain. Pain is intermittent.  Hospital Course:   Transaminitis -With dilated biliary ducts on CT scan. 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 -LFTs improved significantly since procedure. -Will follow up with GI. In the office.  HTN -Well controlled -Continue HCTZ.  Hypokalemia -Repleted  Depression -Continue celexa  Procedures:  ERCP   Consultations:  GI, Dr. Karilyn Cota  Discharge Instructions  Discharge Instructions    Increase activity slowly    Complete by:  As directed             Medication List    TAKE these medications        citalopram 40 MG tablet  Commonly known as:  CELEXA  Take 40 mg by mouth daily.     hydrochlorothiazide 25 MG tablet  Commonly known as:   HYDRODIURIL  Take 25 mg by mouth daily.       Allergies  Allergen Reactions  . Ibuprofen     Told by her doctor not to take it       Follow-up Information    Follow up with [facility], MD. Schedule an appointment as soon as possible for a visit in 2 weeks.   Specialty:  Family Medicine   Contact information:   [address] [place] [address] [phone]        The results of significant diagnostics from this hospitalization (including imaging, microbiology, ancillary and laboratory) are listed below for reference.    Significant Diagnostic Studies: Ct Abdomen Pelvis W Contrast  01/11/2015   CLINICAL DATA:  Acute epigastric pain and back pain for 2 weeks with nausea and vomiting. History of cholecystectomy and hysterectomy.  EXAM: CT ABDOMEN AND PELVIS WITH CONTRAST  TECHNIQUE: Multidetector CT imaging of the abdomen and pelvis was performed using the standard protocol following bolus administration of intravenous contrast.  CONTRAST:  OMNIPAQUE IOHEXOL 300 MG/ML  SOLN  COMPARISON:  02/26/2012  FINDINGS: Lower chest: Clear lung bases. Normal heart size. No pericardial or pleural effusion.  Abdomen: Prior cholecystectomy. Increased diffuse intra and extrahepatic biliary dilatation compared to 02/26/2012. Biliary dilatation extends to the ampulla. Distal common bile duct diameter is 14 mm, image 26. No associated pancreatic duct dilatation.  No other focal hepatic abnormality.  Pancreas, spleen, adrenal glands, and kidneys are within normal limits for age and demonstrate no acute process.  Negative for bowel obstruction, dilatation, ileus,  or free air. Postop changes throughout the anterior abdomen as before.  No abdominal free fluid, fluid collection, hemorrhage, abscess or adenopathy.  Negative for aneurysm.  No retroperitoneal abnormality.  Normal appendix in the right lower quadrant.  Pelvis: Trace pelvic free fluid, likely physiologic. Urinary bladder unremarkable. No  pelvic hemorrhage, hematoma, abscess, adenopathy, inguinal abnormality, or hernia. Uterus and adnexal normal in size.  Endplate degenerative changes of the lumbar spine. No acute compression fracture or acute osseous finding.  IMPRESSION: Increased intra and extrahepatic biliary dilatation. A component of this is likely secondary to prior cholecystectomy. Distal CBD obstruction or an ampullary abnormality cannot be excluded. Consider further evaluation with ERCP/MRCP and correlation with liver function panel.  No associated pancreatic abnormality or pancreatic ductal dilatation.  Stable postoperative findings in the abdomen  No other acute finding or abscess.   Electronically Signed   By: Judie Petit.  Shick M.D.   On: 01/11/2015 15:43   Dg Ercp With Sphincterotomy  01/12/2015   CLINICAL DATA:  54 year old female with history of common bile duct obstruction. ERCP.  EXAM: ERCP  TECHNIQUE: Multiple spot images obtained with the fluoroscopic device and submitted for interpretation post-procedure.  COMPARISON:  CT of the abdomen and pelvis 01/11/2015.  FINDINGS: Multiple ERCP images are submitted for evaluation. These images demonstrate cannulation of the common bile duct, followed by injection of the duct. This retrograde cholangiogram demonstrates a very dilated common bile duct, dilated cystic duct remnant, and mild to moderate dilatation of the intrahepatic biliary tree. No definite filling defect was identified to suggest presence of a ductal stone. Per report from Dr. Karilyn Cota, the patient had papillary stenosis at the time of the examination.  IMPRESSION: 1. Dilated intra and extrahepatic biliary ductal system, without evidence of choledocholithiasis. 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 These images were submitted for radiologic interpretation only. Please see the procedural report for the amount of contrast and the fluoroscopy time utilized.   Electronically Signed   By: Trudie Reed M.D.   On: 01/12/2015 16:57    Microbiology: No results found for this or any previous visit (from the past 240 hour(s)).   Labs: Basic Metabolic Panel:  Recent Labs Lab 01/11/15 1420 01/12/15 0512 01/13/15 0529  NA 139 139 139  K 3.2* 3.6 3.6  CL 105 106 106  CO2 GLUCOSE 111* 91 110*  BUN CREATININE 0.84 0.74 0.77  CALCIUM 9.1 8.9 9.1   Liver Function Tests:  Recent Labs Lab 01/11/15 1420 01/12/15 0512 01/13/15 0529  AST 353* 617* 223*  ALT 234* 653* 451*  ALKPHOS 244* 302* 258*  BILITOT 0.7 0.6 0.7  PROT 7.8 7.4 7.4  ALBUMIN 4.1 3.8 3.7    Recent Labs Lab 01/11/15 1420 01/13/15 0529  LIPASE 22  --   AMYLASE  --  42   No results for input(s): AMMONIA in the last 168 hours. CBC:  Recent Labs Lab 01/11/15 1420 01/13/15 0529  WBC 9.1 6.2  NEUTROABS 5.3  --   HGB 12.2 12.3  HCT 37.4 38.6  MCV 90.3 91.0  PLT 354 360   Cardiac Enzymes: No results for input(s): CKTOTAL, CKMB, CKMBINDEX, TROPONINI in the last 168 hours. BNP: BNP (last 3 results) No results for input(s): BNP in the last 8760 hours.  ProBNP (last 3 results) No results for input(s): PROBNP in the last 8760 hours.  CBG: No results for input(s): GLUCAP in the last 168 hours.  SignedChaya Jan:  HERNANDEZ ACOSTA,ESTELA  [facility] Pager: [phone] 01/13/2015, 2:01 PM

## 2015-01-13 NOTE — Progress Notes (Signed)
Pt's IV catheter removed and intact. Pt's IV site clean dry and intact. Discharge instructions and medications were reviewed and discussed with patient and all questions were answered. No further questions at this time. Pt escorted by nurse tech.

## 2015-01-13 NOTE — Progress Notes (Signed)
Nutrition Brief Note  Patient identified on the Malnutrition Screening Tool (MST) Report  Wt Readings from Last 15 Encounters:  01/12/15 229 lb (103.874 kg)  11/29/14 229 lb (103.874 kg)  11/24/14 229 lb (103.874 kg)  08/23/14 230 lb (104.327 kg)  06/08/14 246 lb (111.585 kg)  05/06/14 251 lb (113.853 kg)  11/27/13 245 lb (111.131 kg)  11/24/13 246 lb (111.585 kg)  09/10/13 243 lb (110.224 kg)  01/29/13 236 lb (107.049 kg)  12/07/12 236 lb (107.049 kg)  02/26/12 231 lb (104.781 kg)  11/19/11 230 lb (104.327 kg)  11/05/11 235 lb (106.595 kg)    Body mass index is 30.22 kg/(m^2). Patient meets criteria for obesity class I based on current BMI. No significant weight changes noted.   Pt dit advanced to clear liquids following ERCP with sphincterotomy by Dr. Karilyn Cotaehman. Labs and medications reviewed.   No nutrition interventions warranted at this time. If nutrition issues arise, please consult RD.   Janet ShiversLynn Selda Jalbert MS,RD,CSG,LDN Office: [phone] Pager: [phone]

## 2015-01-13 NOTE — Progress Notes (Signed)
  Subjective:  Patient has no complaints. Epigastric and left upper quadrant pain has completely resolved. She denies nausea. She had no difficulty with clear liquids. She is waiting for lunch.    Objective: Blood pressure 118/43, pulse 62, temperature 98.2 F (36.8 C), temperature source Oral, resp. rate 20, height 6\' 1"  (1.854 m), weight 229 lb (103.874 kg), SpO2 97 %. Patient is alert and in no acute distress. Abdomen is full. Bowel sounds are normal. On palpation it is soft and nontender without organomegaly or masses.  Labs/studies Results:   Recent Labs  01/11/15 1420 01/13/15 0529  WBC 9.1 6.2  HGB 12.2 12.3  HCT 37.4 38.6  PLT 354 360    BMET   Recent Labs  01/11/15 1420 01/12/15 0512 01/13/15 0529  NA 139 139 139  K 3.2* 3.6 3.6  CL 105 106 106  CO2 28 25 27   GLUCOSE 111* 91 110*  BUN 12 8 7   CREATININE 0.84 0.74 0.77  CALCIUM 9.1 8.9 9.1    LFT   Recent Labs  01/11/15 1420 01/12/15 0512 01/13/15 0529  PROT 7.8 7.4 7.4  ALBUMIN 4.1 3.8 3.7  AST 353* 617* 223*  ALT 234* 653* 451*  ALKPHOS 244* 302* 258*  BILITOT 0.7 0.6 0.7    Serum Amylase 42                                               Assessment:  #1. 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 Status post ERCP with biliary sphincterotomy yesterday. Patient's abdominal pain has completely resolved and there is significant improvement in transaminases and alkaline phosphatase. Patient did not get 4 g sodium diet as requested last night. She should be able to go home after lunch unless she has symptoms.    Recommendations;  Advance diet. Stable for discharge from GI standpoint after lunch. Patient advised not to take aspirin or other NSAIDs for 3 days. LFTs in two weeks. My office will call patient.

## 2015-01-13 NOTE — Anesthesia Postprocedure Evaluation (Signed)
  Anesthesia Post-op Note  Patient: Janet Mcguire  Procedure(s) Performed: Procedure(s) with comments: ENDOSCOPIC RETROGRADE CHOLANGIOPANCREATOGRAPHY (ERCP)  (N/A) - Balloon Extraction SPHINCTEROTOMY (N/A)  Patient Location: Nursing Unit  Anesthesia Type:General  Level of Consciousness: awake, alert  and oriented  Airway and Oxygen Therapy: Patient Spontanous Breathing  Post-op Pain: none  Post-op Assessment: Post-op Vital signs reviewed, Patient's Cardiovascular Status Stable, Respiratory Function Stable, Patent Airway, No signs of Nausea or vomiting and Adequate PO intake  Post-op Vital Signs: Reviewed and stable  Last Vitals:  Filed Vitals:   01/13/15 0433  BP: 118/43  Pulse: 62  Temp: 36.8 C  Resp: 20    Complications: No apparent anesthesia complications

## 2015-01-16 ENCOUNTER — Encounter (HOSPITAL_COMMUNITY): Payer: Self-pay | Admitting: Internal Medicine

## 2015-02-27 ENCOUNTER — Emergency Department (HOSPITAL_COMMUNITY): Payer: Medicaid Other

## 2015-02-27 ENCOUNTER — Emergency Department (HOSPITAL_COMMUNITY)
Admission: EM | Admit: 2015-02-27 | Discharge: 2015-02-27 | Disposition: A | Discharge status: Home / Self Care | Payer: Medicaid Other | Attending: Emergency Medicine | Admitting: Emergency Medicine

## 2015-02-27 ENCOUNTER — Encounter (HOSPITAL_COMMUNITY): Payer: Self-pay | Admitting: Cardiology

## 2015-02-27 DIAGNOSIS — Y998 Other external cause status: Secondary | ICD-10-CM | POA: Insufficient documentation

## 2015-02-27 DIAGNOSIS — X034XXA Hit by object due to controlled fire, not in building or structure, initial encounter: Secondary | ICD-10-CM | POA: Diagnosis not present

## 2015-02-27 DIAGNOSIS — F419 Anxiety disorder, unspecified: Secondary | ICD-10-CM | POA: Diagnosis not present

## 2015-02-27 DIAGNOSIS — I10 Essential (primary) hypertension: Secondary | ICD-10-CM | POA: Diagnosis not present

## 2015-02-27 DIAGNOSIS — Z23 Encounter for immunization: Secondary | ICD-10-CM | POA: Insufficient documentation

## 2015-02-27 DIAGNOSIS — T24132A Burn of first degree of left lower leg, initial encounter: Secondary | ICD-10-CM | POA: Insufficient documentation

## 2015-02-27 DIAGNOSIS — T24102A Burn of first degree of unspecified site of left lower limb, except ankle and foot, initial encounter: Secondary | ICD-10-CM

## 2015-02-27 DIAGNOSIS — Y92009 Unspecified place in unspecified non-institutional (private) residence as the place of occurrence of the external cause: Secondary | ICD-10-CM | POA: Diagnosis not present

## 2015-02-27 DIAGNOSIS — S8992XA Unspecified injury of left lower leg, initial encounter: Secondary | ICD-10-CM | POA: Diagnosis present

## 2015-02-27 DIAGNOSIS — Y9389 Activity, other specified: Secondary | ICD-10-CM | POA: Insufficient documentation

## 2015-02-27 DIAGNOSIS — T148XXA Other injury of unspecified body region, initial encounter: Secondary | ICD-10-CM

## 2015-02-27 DIAGNOSIS — F329 Major depressive disorder, single episode, unspecified: Secondary | ICD-10-CM | POA: Insufficient documentation

## 2015-02-27 MED ORDER — HYDROCODONE-ACETAMINOPHEN 5-325 MG PO TABS
1.0000 | ORAL_TABLET | Freq: Once | ORAL | Status: AC
  Administered 2015-02-27: 1 via ORAL
  Filled 2015-02-27: qty 1

## 2015-02-27 MED ORDER — SILVER SULFADIAZINE 1 % EX CREA
TOPICAL_CREAM | Freq: Once | CUTANEOUS | Status: DC
  Filled 2015-02-27: qty 50

## 2015-02-27 MED ORDER — TETANUS-DIPHTH-ACELL PERTUSSIS 5-2.5-18.5 LF-MCG/0.5 IM SUSP
0.5000 mL | Freq: Once | INTRAMUSCULAR | Status: AC
  Administered 2015-02-27: 0.5 mL via INTRAMUSCULAR
  Filled 2015-02-27: qty 0.5

## 2015-02-27 MED ORDER — OXYCODONE-ACETAMINOPHEN 5-325 MG PO TABS: 1.0000 | ORAL_TABLET | ORAL | Status: DC | PRN
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
## 2015-02-27 NOTE — ED Notes (Signed)
Pt refused the crutches, says she can ambulate better without them
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
## 2015-02-27 NOTE — Discharge Instructions (Signed)
Burn Care °Your skin is a natural barrier to infection. It is the largest organ of your body. Burns damage this natural protection. To help prevent infection, it is very important to follow your caregiver's instructions in the care of your burn. °Burns are classified as: °· First degree. There is only redness of the skin (erythema). No scarring is expected. °· Second degree. There is blistering of the skin. Scarring may occur with deeper burns. °· Third degree. All layers of the skin are injured, and scarring is expected. °HOME CARE INSTRUCTIONS  °· Wash your hands well before changing your bandage. °· Change your bandage as often as directed by your caregiver. °¨ Remove the old bandage. If the bandage sticks, you may soak it off with cool, clean water. °¨ Cleanse the burn thoroughly but gently with mild soap and water. °¨ Pat the area dry with a clean, dry cloth. °¨ Apply a thin layer of antibacterial cream to the burn. °¨ Apply a clean bandage as instructed by your caregiver. °¨ Keep the bandage as clean and dry as possible. °· Elevate the affected area for the first 24 hours, then as instructed by your caregiver. °· Only take over-the-counter or prescription medicines for pain, discomfort, or fever as directed by your caregiver. °SEEK IMMEDIATE MEDICAL CARE IF:  °· You develop excessive pain. °· You develop redness, tenderness, swelling, or red streaks near the burn. °· The burned area develops yellowish-white fluid (pus) or a bad smell. °· You have a fever. °MAKE SURE YOU:  °· Understand these instructions. °· Will watch your condition. °· Will get help right away if you are not doing well or get worse. °Document Released: 10/07/2005 Document Revised: 12/30/2011 Document Reviewed: 02/27/2011 °Exit[employer]® Patient Information ©2015 [employer]. This information is not intended to replace advice given to you by your health care provider. Make sure you discuss any questions you have with your health care  provider. °Contusion °A contusion is a deep bruise. Contusions are the result of an injury that caused bleeding under the skin. The contusion may turn blue, purple, or yellow. Minor injuries will give you a painless contusion, but more severe contusions may stay painful and swollen for a few weeks.  °CAUSES  °A contusion is usually caused by a blow, trauma, or direct force to an area of the body. °SYMPTOMS  °· Swelling and redness of the injured area. °· Bruising of the injured area. °· Tenderness and soreness of the injured area. °· Pain. °DIAGNOSIS  °The diagnosis can be made by taking a history and physical exam. An X-ray, CT scan, or MRI may be needed to determine if there were any associated injuries, such as fractures. °TREATMENT  °Specific treatment will depend on what area of the body was injured. In general, the best treatment for a contusion is resting, icing, elevating, and applying cold compresses to the injured area. Over-the-counter medicines may also be recommended for pain control. Ask your caregiver what the best treatment is for your contusion. °HOME CARE INSTRUCTIONS  °· Put ice on the injured area. °¨ Put ice in a plastic bag. °¨ Place a towel between your skin and the bag. °¨ Leave the ice on for 15-20 minutes, 3-4 times a day, or as directed by your health care provider. °· Only take over-the-counter or prescription medicines for pain, discomfort, or fever as directed by your caregiver. Your caregiver may recommend avoiding anti-inflammatory medicines (aspirin, ibuprofen, and naproxen) for 48 hours because these medicines may increase bruising. °·   Rest the injured area. °· If possible, elevate the injured area to reduce swelling. °SEEK IMMEDIATE MEDICAL CARE IF:  °· You have increased bruising or swelling. °· You have pain that is getting worse. °· Your swelling or pain is not relieved with medicines. °MAKE SURE YOU:  °· Understand these instructions. °· Will watch your condition. °· Will get  help right away if you are not doing well or get worse. °Document Released: 07/17/2005 Document Revised: 10/12/2013 Document Reviewed: 08/12/2011 °ExitCare® Patient Information ©2015 [employer]. This information is not intended to replace advice given to you by your health care provider. Make sure you discuss any questions you have with your health care provider. ° °

## 2015-06-12 ENCOUNTER — Emergency Department (HOSPITAL_COMMUNITY)
Admission: EM | Admit: 2015-06-12 | Discharge: 2015-06-12 | Disposition: A | Discharge status: Home / Self Care | Payer: Medicaid Other | Attending: Emergency Medicine | Admitting: Emergency Medicine

## 2015-06-12 ENCOUNTER — Encounter (HOSPITAL_COMMUNITY): Payer: Self-pay | Admitting: *Deleted

## 2015-06-12 ENCOUNTER — Emergency Department (HOSPITAL_COMMUNITY): Payer: Medicaid Other

## 2015-06-12 DIAGNOSIS — F329 Major depressive disorder, single episode, unspecified: Secondary | ICD-10-CM | POA: Insufficient documentation

## 2015-06-12 DIAGNOSIS — F419 Anxiety disorder, unspecified: Secondary | ICD-10-CM | POA: Insufficient documentation

## 2015-06-12 DIAGNOSIS — M7711 Lateral epicondylitis, right elbow: Secondary | ICD-10-CM | POA: Diagnosis not present

## 2015-06-12 DIAGNOSIS — M25521 Pain in right elbow: Secondary | ICD-10-CM | POA: Diagnosis present

## 2015-06-12 DIAGNOSIS — Z79899 Other long term (current) drug therapy: Secondary | ICD-10-CM | POA: Insufficient documentation

## 2015-06-12 DIAGNOSIS — I10 Essential (primary) hypertension: Secondary | ICD-10-CM | POA: Diagnosis not present

## 2015-06-12 DIAGNOSIS — L309 Dermatitis, unspecified: Secondary | ICD-10-CM

## 2015-06-12 MED ORDER — HYDROCORTISONE 1 % EX CREA: TOPICAL_CREAM | CUTANEOUS | Status: DC

## 2015-06-12 MED ORDER — NAPROXEN 500 MG PO TABS: 500.0000 mg | ORAL_TABLET | Freq: Two times a day (BID) | ORAL | Status: DC

## 2015-06-12 NOTE — ED Provider Notes (Signed)
CSN: [id number]     Arrival date & time 06/12/15  1105 History  This chart was scribed for Janet Octave, MD by Jarvis Morgan, ED Scribe. This patient was seen in room [facility]17/[facility]17 and the patient's care was started at 12:07 PM.    Chief Complaint  Patient presents with  . Elbow Pain    The history is provided by the patient. No language interpreter was used.    HPI Comments: Janet Mcguire is a 54 y.o. female who presents to the Emergency Department complaining of constant, moderate, right elbow pain onset 2 months. The pain is non radiating. She denies any known injury. She states nothing makes the pain better. Pt reports the pain is exacerbated with bending her right arm. She has not tried any treatments for the pain. She denies any chest pain, SOB, swelling, numbness, tingling or weakness,  She has a second complaint of an itchy, red rash to her neck and bilateral arms onset 2 weeks. She denies using any new soaps or detergents. She denies any known allergies. Pt denies any known bug bites. She has not slept in a new environment lately. Pt reports h/o eczema. She denies any mouth lesions or genital lesions.  Past Medical History  Diagnosis Date  . Depression   . Anxiety   . Hypertension    Past Surgical History  Procedure Laterality Date  . Cholecystectomy    . Tubal ligation    . Ercp N/A 01/12/2015    Procedure: ENDOSCOPIC RETROGRADE CHOLANGIOPANCREATOGRAPHY (ERCP) ;  Surgeon: Malissa Hippo, MD;  Location: [facility];  Service: Endoscopy;  Laterality: N/A;  Balloon Extraction  . Sphincterotomy N/A 01/12/2015    Procedure: SPHINCTEROTOMY;  Surgeon: Malissa Hippo, MD;  Location: [facility];  Service: Endoscopy;  Laterality: N/A;   Family History  Problem Relation Age of Onset  . Diabetes Mother    Social History  Substance Use Topics  . Smoking status: Never Smoker   . Smokeless tobacco: None  . Alcohol Use: No   OB History    Gravida Para Term Preterm AB TAB SAB Ectopic  Multiple Living   3 1 1  2 2          Review of Systems  A complete 10 system review of systems was obtained and all systems are negative except as noted in the HPI and PMH.      Allergies  Ibuprofen  Home Medications   Prior to Admission medications   Medication Sig Start Date End Date Taking? Authorizing Provider  citalopram (CELEXA) 40 MG tablet Take 40 mg by mouth daily.   Yes Historical Provider, MD  hydrochlorothiazide (HYDRODIURIL) 25 MG tablet Take 25 mg by mouth daily.    Yes Historical Provider, MD  pantoprazole (PROTONIX) 40 MG tablet Take 40 mg by mouth daily.   Yes Historical Provider, MD  hydrocortisone cream 1 % Apply to affected area 2 times daily 06/12/15   Janet Octave, MD  naproxen (NAPROSYN) 500 MG tablet Take 1 tablet (500 mg total) by mouth 2 (two) times daily. 06/12/15   Janet Octave, MD  oxyCODONE-acetaminophen (PERCOCET/ROXICET) 5-325 MG per tablet Take 1 tablet by mouth every 4 (four) hours as needed. Patient not taking: Reported on 06/12/2015 02/27/15   Burgess Amor, PA-C   Triage Vitals: BP 120/67 mmHg  Pulse 70  Temp(Src) 98.2 F (36.8 C) (Oral)  Resp 18  Ht 6\' 1"  (1.854 m)  Wt 235 lb (106.595 kg)  BMI 31.01 kg/m2  SpO2 100%  LMP  (LMP Unknown)  Physical Exam  Constitutional: She is oriented to person, place, and time. She appears well-developed and well-nourished. No distress.  HENT:  Head: Normocephalic and atraumatic.  Mouth/Throat: Oropharynx is clear and moist. No oropharyngeal exudate.  Eyes: Conjunctivae and EOM are normal. Pupils are equal, round, and reactive to light.  Neck: Normal range of motion. Neck supple.  No meningismus.  Cardiovascular: Normal rate, regular rhythm, normal heart sounds and intact distal pulses.   No murmur heard. Pulmonary/Chest: Effort normal and breath sounds normal. No respiratory distress.  Abdominal: Soft. There is no tenderness. There is no rebound and no guarding.  Musculoskeletal: Normal range of  motion. She exhibits tenderness. She exhibits no edema.  Right Elbow: No effusion, tenderness to lateral epicondyle and olecranon. Full ROM w/o pain. Intact radial pulse  Pain with wrist flexion while elbow extended  Neurological: She is alert and oriented to person, place, and time. No cranial nerve deficit. She exhibits normal muscle tone. Coordination normal.  No ataxia on finger to nose bilaterally. No pronator drift. 5/5 strength throughout. CN 2-12 intact. Negative Romberg. Equal grip strength. Sensation intact. Gait is normal.   Skin: Skin is warm.  excoriated papules to left neck and right upper arm w/o evidence of cellulitis  Psychiatric: She has a normal mood and affect. Her behavior is normal.  Nursing note and vitals reviewed.   ED Course  Procedures (including critical care time)  DIAGNOSTIC STUDIES: Oxygen Saturation is 100% on RA, normal by my interpretation.    COORDINATION OF CARE: 12:12 PM- Will order x-ray of right elbow. Pt advised of plan for treatment and pt agrees.   . Labs Review Labs Reviewed - No data to display  Imaging Review Dg Elbow Complete Right  06/12/2015   CLINICAL DATA:  Lateral elbow pain for a couple of months  EXAM: RIGHT ELBOW - COMPLETE 3+ VIEW  COMPARISON:  None.  FINDINGS: Four views of the right elbow submitted. No acute fracture or subluxation. No radiopaque foreign body. No posterior fat pad sign.  IMPRESSION: Negative.   Electronically Signed   By: Natasha Mead M.D.   On: 06/12/2015 12:36   I have personally reviewed and evaluated these images and lab results as part of my medical decision-making.   EKG Interpretation None      MDM   Final diagnoses:  Eczema  Lateral epicondylitis, right   One month of atraumatic right elbow pain. Worse with movement and palpation. No fever.  Full range of motion of elbow joint. No effusion. Neurovascularly intact distally  Patient also complains of itchy bumps to left neck and bilateral  upper arms for 2 weeks. History of eczema.  X-ray negative. Treated supportively for lateral epicondylitis. Brace provided. Follow-up with PCP. Return precautions discussed. Hydrocortisone for eczema.  I personally performed the services described in this documentation, which was scribed in my presence. The recorded information has been reviewed and is accurate.    Janet Octave, MD 06/12/15 2040

## 2015-06-12 NOTE — ED Notes (Signed)
Patient reports right elbow pain x 2 months. Also reports "bumps" to neck and arms x 2 weeks.

## 2015-06-12 NOTE — Discharge Instructions (Signed)

## 2015-07-15 ENCOUNTER — Emergency Department (HOSPITAL_COMMUNITY)
Admission: EM | Admit: 2015-07-15 | Discharge: 2015-07-15 | Disposition: A | Discharge status: Home / Self Care | Payer: Medicaid Other | Attending: Emergency Medicine | Admitting: Emergency Medicine

## 2015-07-15 ENCOUNTER — Encounter (HOSPITAL_COMMUNITY): Payer: Self-pay | Admitting: *Deleted

## 2015-07-15 ENCOUNTER — Emergency Department (HOSPITAL_COMMUNITY): Payer: Medicaid Other

## 2015-07-15 DIAGNOSIS — Z79899 Other long term (current) drug therapy: Secondary | ICD-10-CM | POA: Diagnosis not present

## 2015-07-15 DIAGNOSIS — G4489 Other headache syndrome: Secondary | ICD-10-CM | POA: Insufficient documentation

## 2015-07-15 DIAGNOSIS — I10 Essential (primary) hypertension: Secondary | ICD-10-CM | POA: Diagnosis not present

## 2015-07-15 DIAGNOSIS — R51 Headache: Secondary | ICD-10-CM | POA: Diagnosis present

## 2015-07-15 DIAGNOSIS — F419 Anxiety disorder, unspecified: Secondary | ICD-10-CM | POA: Diagnosis not present

## 2015-07-15 DIAGNOSIS — F329 Major depressive disorder, single episode, unspecified: Secondary | ICD-10-CM | POA: Diagnosis not present

## 2015-07-15 DIAGNOSIS — R112 Nausea with vomiting, unspecified: Secondary | ICD-10-CM | POA: Insufficient documentation

## 2015-07-15 MED ORDER — METOCLOPRAMIDE HCL 5 MG/ML IJ SOLN
10.0000 mg | Freq: Once | INTRAMUSCULAR | Status: AC
  Administered 2015-07-15: 10 mg via INTRAVENOUS
  Filled 2015-07-15: qty 2

## 2015-07-15 MED ORDER — DIPHENHYDRAMINE HCL 50 MG/ML IJ SOLN
25.0000 mg | Freq: Once | INTRAMUSCULAR | Status: AC
  Administered 2015-07-15: 25 mg via INTRAVENOUS
  Filled 2015-07-15: qty 1

## 2015-07-15 NOTE — Discharge Instructions (Signed)

## 2015-07-15 NOTE — ED Provider Notes (Signed)
CSN: [id number]     Arrival date & time 07/15/15  0732 History   First MD Initiated Contact with Patient 07/15/15 0745     Chief Complaint  Patient presents with  . Headache    Patient is a 54 y.o. female presenting with headaches. The history is provided by the patient.  Headache Pain location:  Frontal Quality:  Dull Onset quality:  Gradual Duration:  12 hours Timing:  Constant Progression:  Worsening Chronicity:  New Relieved by:  Nothing Worsened by:  Nothing Associated symptoms: nausea and vomiting   Associated symptoms: no fever, no focal weakness and no weakness   pt reports onset of frontal HA yesterday No falls/trauma It worsened gradually She reports nausea/vomiting No focal weakness She denies any other pain complaints except for mild abdominal discomfort She usually does not have headaches  Soc hx - admits to [facility] use, no other drug use  Past Medical History  Diagnosis Date  . Depression   . Anxiety   . Hypertension    Past Surgical History  Procedure Laterality Date  . Cholecystectomy    . Tubal ligation    . Ercp N/A 01/12/2015    Procedure: ENDOSCOPIC RETROGRADE CHOLANGIOPANCREATOGRAPHY (ERCP) ;  Surgeon: Malissa Hippo, MD;  Location: [facility];  Service: Endoscopy;  Laterality: N/A;  Balloon Extraction  . Sphincterotomy N/A 01/12/2015    Procedure: SPHINCTEROTOMY;  Surgeon: Malissa Hippo, MD;  Location: [facility];  Service: Endoscopy;  Laterality: N/A;   Family History  Problem Relation Age of Onset  . Diabetes Mother    Social History  Substance Use Topics  . Smoking status: Never Smoker   . Smokeless tobacco: None  . Alcohol Use: No   OB History    Gravida Para Term Preterm AB TAB SAB Ectopic Multiple Living   Review of Systems  Constitutional: Negative for fever.  Cardiovascular: Negative for chest pain.  Gastrointestinal: Positive for nausea and vomiting.  Neurological: Positive for headaches. Negative for focal  weakness and weakness.  All other systems reviewed and are negative.     Allergies  Ibuprofen  Home Medications   Prior to Admission medications   Medication Sig Start Date End Date Taking? Authorizing Provider  citalopram (CELEXA) 40 MG tablet Take 40 mg by mouth daily.    Historical Provider, MD  hydrochlorothiazide (HYDRODIURIL) 25 MG tablet Take 25 mg by mouth daily.     Historical Provider, MD  pantoprazole (PROTONIX) 40 MG tablet Take 40 mg by mouth daily.    Historical Provider, MD   BP 102/68 mmHg  Pulse 82  Temp(Src) 98.1 F (36.7 C) (Oral)  Resp 16  Ht  (1.702 m)  Wt 235 lb (106.595 kg)  BMI 36.80 kg/m2  SpO2 100%  LMP  (LMP Unknown) Physical Exam CONSTITUTIONAL: Well developed/well nourished, tearful, anxious HEAD: Normocephalic/atraumatic EYES: EOMI/PERRL, no nystagmus, no ptosis ENMT: Mucous membranes moist NECK: supple no meningeal signs, no bruits SPINE/BACK:entire spine nontender CV: S1/S2 noted, no murmurs/rubs/gallops noted LUNGS: Lungs are clear to auscultation bilaterally, no apparent distress ABDOMEN: soft, nontender, no rebound or guarding GU:no cva tenderness NEURO:Awake/alert, face symmetric, no arm or leg drift is noted Equal 5/5 strength with shoulder abduction, elbow flex/extension, wrist flex/extension in upper extremities and equal hand grips bilaterally Equal 5/5 strength with hip flexion,knee flex/extension, foot dorsi/plantar flexion Cranial nerves 3/4/5/6/04/28/09/11/12 tested and intact Gait normal without ataxia No past pointing Sensation  to light touch intact in all extremities EXTREMITIES: pulses normal, full ROM SKIN: warm, color normal PSYCH: no abnormalities of mood noted, alert and oriented to situation   ED Course  Procedures  7:58 AM Will check CT head due to age (>50) and reports she has not had this HA anytime recently Will treat pain 8:58 AM CT head negative Pt feels improved I have low suspicion for [facility] or  other acute neurologic emergency at this time, given history/exam We discussed strict ER return precautions Pt agreeable with plan   Imaging Review Ct Head Wo Contrast  07/15/2015   CLINICAL DATA:  Headache, vomiting  EXAM: CT HEAD WITHOUT CONTRAST  TECHNIQUE: Contiguous axial images were obtained from the base of the skull through the vertex without intravenous contrast.  COMPARISON:  None.  FINDINGS: No evidence of parenchymal hemorrhage or extra-axial fluid collection. No mass lesion, mass effect, or midline shift.  No CT evidence of acute infarction.  Cerebral volume is within normal limits.  No ventriculomegaly.  The visualized paranasal sinuses are essentially clear. The mastoid air cells are unopacified.  No evidence of calvarial fracture.  IMPRESSION: Normal head CT.   Electronically Signed   By: Charline Bills M.D.   On: 07/15/2015 08:39   Medications  metoCLOPramide (REGLAN) injection 10 mg (10 mg Intravenous Given 07/15/15 0803)  diphenhydrAMINE (BENADRYL) injection 25 mg (25 mg Intravenous Given 07/15/15 0803)     MDM   Final diagnoses:  Other headache syndrome    Nursing notes including past medical history and social history reviewed and considered in documentation     Zadie Rhine, MD 07/15/15 [phone]

## 2015-07-15 NOTE — ED Notes (Signed)
Pt states headache began last night at ~1900. Pt states no history of headaches. States she vomited last night. Pt is tearful.

## 2015-07-30 ENCOUNTER — Emergency Department (HOSPITAL_COMMUNITY)
Admission: EM | Admit: 2015-07-30 | Discharge: 2015-07-30 | Disposition: A | Discharge status: Home / Self Care | Payer: Medicaid Other | Attending: Emergency Medicine | Admitting: Emergency Medicine

## 2015-07-30 ENCOUNTER — Encounter (HOSPITAL_COMMUNITY): Payer: Self-pay | Admitting: Emergency Medicine

## 2015-07-30 DIAGNOSIS — M545 Low back pain: Secondary | ICD-10-CM | POA: Insufficient documentation

## 2015-07-30 DIAGNOSIS — F419 Anxiety disorder, unspecified: Secondary | ICD-10-CM | POA: Diagnosis not present

## 2015-07-30 DIAGNOSIS — Z3202 Encounter for pregnancy test, result negative: Secondary | ICD-10-CM | POA: Insufficient documentation

## 2015-07-30 DIAGNOSIS — K5900 Constipation, unspecified: Secondary | ICD-10-CM | POA: Insufficient documentation

## 2015-07-30 DIAGNOSIS — I10 Essential (primary) hypertension: Secondary | ICD-10-CM | POA: Insufficient documentation

## 2015-07-30 DIAGNOSIS — Z9851 Tubal ligation status: Secondary | ICD-10-CM | POA: Insufficient documentation

## 2015-07-30 DIAGNOSIS — Z9049 Acquired absence of other specified parts of digestive tract: Secondary | ICD-10-CM | POA: Insufficient documentation

## 2015-07-30 DIAGNOSIS — R1031 Right lower quadrant pain: Secondary | ICD-10-CM | POA: Insufficient documentation

## 2015-07-30 DIAGNOSIS — Z79899 Other long term (current) drug therapy: Secondary | ICD-10-CM | POA: Insufficient documentation

## 2015-07-30 DIAGNOSIS — R1032 Left lower quadrant pain: Secondary | ICD-10-CM | POA: Diagnosis not present

## 2015-07-30 DIAGNOSIS — F329 Major depressive disorder, single episode, unspecified: Secondary | ICD-10-CM | POA: Insufficient documentation

## 2015-07-30 DIAGNOSIS — R109 Unspecified abdominal pain: Secondary | ICD-10-CM

## 2015-07-30 LAB — LIPASE, BLOOD: Lipase: 24 U/L (ref 22–51)

## 2015-07-30 LAB — URINALYSIS, ROUTINE W REFLEX MICROSCOPIC
BILIRUBIN URINE: NEGATIVE
GLUCOSE, UA: NEGATIVE mg/dL
Ketones, ur: NEGATIVE mg/dL
Leukocytes, UA: NEGATIVE
Nitrite: NEGATIVE
Protein, ur: 100 mg/dL — AB
Specific Gravity, Urine: 1.02 (ref 1.005–1.030)
UROBILINOGEN UA: 0.2 mg/dL (ref 0.0–1.0)
pH: 7.5 (ref 5.0–8.0)

## 2015-07-30 LAB — COMPREHENSIVE METABOLIC PANEL
ALK PHOS: 68 U/L (ref 38–126)
ALT: 18 U/L (ref 14–54)
ANION GAP: 6 (ref 5–15)
AST: 20 U/L (ref 15–41)
Albumin: 3.9 g/dL (ref 3.5–5.0)
BUN: 12 mg/dL (ref 6–20)
CO2: 27 mmol/L (ref 22–32)
Calcium: 8.7 mg/dL — ABNORMAL LOW (ref 8.9–10.3)
Chloride: 109 mmol/L (ref 101–111)
Creatinine, Ser: 0.84 mg/dL (ref 0.44–1.00)
Glucose, Bld: 102 mg/dL — ABNORMAL HIGH (ref 65–99)
Potassium: 3.6 mmol/L (ref 3.5–5.1)
Sodium: 142 mmol/L (ref 135–145)
Total Bilirubin: 0.4 mg/dL (ref 0.3–1.2)
Total Protein: 7.9 g/dL (ref 6.5–8.1)

## 2015-07-30 LAB — CBC WITH DIFFERENTIAL/PLATELET
Basophils Absolute: 0.1 10*3/uL (ref 0.0–0.1)
Basophils Relative: 1 %
Eosinophils Absolute: 0.2 10*3/uL (ref 0.0–0.7)
Eosinophils Relative: 4 %
HEMATOCRIT: 39.9 % (ref 36.0–46.0)
HEMOGLOBIN: 13 g/dL (ref 12.0–15.0)
LYMPHS ABS: 2.5 10*3/uL (ref 0.7–4.0)
Lymphocytes Relative: 43 %
MCH: 29.5 pg (ref 26.0–34.0)
MCHC: 32.6 g/dL (ref 30.0–36.0)
MCV: 90.5 fL (ref 78.0–100.0)
MONOS PCT: 8 %
Monocytes Absolute: 0.5 10*3/uL (ref 0.1–1.0)
NEUTROS PCT: 44 %
Neutro Abs: 2.6 10*3/uL (ref 1.7–7.7)
Platelets: 325 10*3/uL (ref 150–400)
RBC: 4.41 MIL/uL (ref 3.87–5.11)
RDW: 15.1 % (ref 11.5–15.5)
WBC: 5.8 10*3/uL (ref 4.0–10.5)

## 2015-07-30 LAB — URINE MICROSCOPIC-ADD ON

## 2015-07-30 LAB — PREGNANCY, URINE: PREG TEST UR: NEGATIVE

## 2015-07-30 MED ORDER — ONDANSETRON HCL 4 MG/2ML IJ SOLN
4.0000 mg | Freq: Once | INTRAMUSCULAR | Status: AC
  Administered 2015-07-30: 4 mg via INTRAVENOUS
  Filled 2015-07-30: qty 2

## 2015-07-30 MED ORDER — SODIUM CHLORIDE 0.9 % IV BOLUS (SEPSIS)
500.0000 mL | Freq: Once | INTRAVENOUS | Status: AC
  Administered 2015-07-30: 500 mL via INTRAVENOUS

## 2015-07-30 MED ORDER — HYDROMORPHONE HCL 1 MG/ML IJ SOLN
0.5000 mg | Freq: Once | INTRAMUSCULAR | Status: AC
  Administered 2015-07-30: 0.5 mg via INTRAVENOUS
  Filled 2015-07-30: qty 1

## 2015-07-30 NOTE — ED Notes (Signed)
Pt came in with C/O of abdominal and back pain since Friday after consuming different oil based foods. Pt reports not knowing when LBM was.

## 2015-07-30 NOTE — ED Notes (Signed)
Pt states she thinks she is constipated. She does not know when her last bowel movement was. State she has been eating a lot of greasy food that she has been cooking at home.

## 2015-07-30 NOTE — Discharge Instructions (Signed)
Take tylenol for pain and follow up with your md this week.  Return here if pain becomes severe

## 2015-07-30 NOTE — ED Provider Notes (Signed)
CSN: [id number]     Arrival date & time 07/30/15  4098 History  By signing my name below, I, [name], attest that this documentation has been prepared under the direction and in the presence of [name], MD. Electronically Signed: [name], ED Scribe. 07/30/2015. 9:28 AM.    Chief Complaint  Patient presents with  . Abdominal Pain  . Back Pain     Patient is a 54 y.o. female presenting with abdominal pain and back pain. The history is provided by the patient. No language interpreter was used.  Abdominal Pain Pain location:  LLQ and RLQ Pain quality: fullness   Pain radiates to:  Back Pain severity:  Moderate Onset quality:  Gradual Duration:  2 days Timing:  Constant Progression:  Unchanged Chronicity:  New Context: diet changes   Relieved by:  None tried Worsened by:  Nothing tried Ineffective treatments:  None tried Associated symptoms: constipation   Associated symptoms: no chest pain, no chills, no cough, no diarrhea, no fatigue, no fever, no hematuria and no vomiting   Back Pain Associated symptoms: abdominal pain   Associated symptoms: no chest pain, no fever and no headaches    HPI Comments: Janet Mcguire is a 54 y.o. female who presents to the Emergency Department complaining of constant lower abdominal pain with associated bilateral lower back pain that has been present for two days after pt consumed a large amount of food. Pt states she thinks her "bowels need to move" and reports being constipated the past couple of days. Pt denies vomiting, fever, chills. Pt has not done anything to try to treat her symptoms.     Past Medical History  Diagnosis Date  . Depression   . Anxiety   . Hypertension    Past Surgical History  Procedure Laterality Date  . Cholecystectomy    . Tubal ligation    . Ercp N/A 01/12/2015    Procedure: ENDOSCOPIC RETROGRADE CHOLANGIOPANCREATOGRAPHY (ERCP) ;  Surgeon: Malissa Hippo, MD;  Location: [facility];  Service: Endoscopy;   Laterality: N/A;  Balloon Extraction  . Sphincterotomy N/A 01/12/2015    Procedure: SPHINCTEROTOMY;  Surgeon: Malissa Hippo, MD;  Location: [facility];  Service: Endoscopy;  Laterality: N/A;   Family History  Problem Relation Age of Onset  . Diabetes Mother    Social History  Substance Use Topics  . Smoking status: Never Smoker   . Smokeless tobacco: None  . Alcohol Use: No   OB History    Gravida Para Term Preterm AB TAB SAB Ectopic Multiple Living   Review of Systems  Constitutional: Negative for fever, chills, appetite change and fatigue.  HENT: Negative for congestion, ear discharge and sinus pressure.   Eyes: Negative for discharge.  Respiratory: Negative for cough.   Cardiovascular: Negative for chest pain.  Gastrointestinal: Positive for abdominal pain and constipation. Negative for vomiting and diarrhea.  Genitourinary: Negative for frequency and hematuria.  Musculoskeletal: Positive for back pain.  Skin: Negative for rash.  Neurological: Negative for seizures and headaches.  Psychiatric/Behavioral: Negative for hallucinations.      Allergies  Ibuprofen  Home Medications   Prior to Admission medications   Medication Sig Start Date End Date Taking? Authorizing Provider  citalopram (CELEXA) 40 MG tablet Take 40 mg by mouth daily.    Historical Provider, MD  hydrochlorothiazide (HYDRODIURIL) 25 MG tablet Take 25 mg by mouth daily.  Historical Provider, MD  pantoprazole (PROTONIX) 40 MG tablet Take 40 mg by mouth daily.    Historical Provider, MD   BP 139/79 mmHg  Pulse 70  Temp(Src) 98.4 F (36.9 C) (Oral)  Resp 20  Ht  (1.854 m)  Wt 234 lb (106.142 kg)  BMI 30.88 kg/m2  SpO2 100%  LMP  (LMP Unknown) Physical Exam  Constitutional: She is oriented to person, place, and time. She appears well-developed.  HENT:  Head: Normocephalic.  Eyes: Conjunctivae and EOM are normal. No scleral icterus.  Neck: Neck supple. No thyromegaly  present.  Cardiovascular: Normal rate and regular rhythm.  Exam reveals no gallop and no friction rub.   No murmur heard. Pulmonary/Chest: No stridor. She has no wheezes. She has no rales. She exhibits no tenderness.  Abdominal: She exhibits no distension. There is tenderness. There is no rebound.  Moderate RLQ and LLQ tenderness  Musculoskeletal: Normal range of motion. She exhibits no edema.  Lymphadenopathy:    She has no cervical adenopathy.  Neurological: She is oriented to person, place, and time. She exhibits normal muscle tone. Coordination normal.  Skin: No rash noted. No erythema.  Psychiatric: She has a normal mood and affect. Her behavior is normal.    ED Course  Procedures (including critical care time)  DIAGNOSTIC STUDIES: Oxygen Saturation is 100% on room air, normal by my interpretation.    COORDINATION OF CARE: 9:24 AM Discussed treatment plan with pt at bedside and pt agreed to plan.   Labs Review Labs Reviewed - No data to display  Imaging Review No results found. I have personally reviewed and evaluated these images and lab results as part of my medical decision-making.   EKG Interpretation None      MDM   Final diagnoses:  None    Labs unremarkable,  Pt with chronic pain,  Improved.  Will follow  Up with pcp  The chart was scribed for me under my direct supervision.  I personally performed the history, physical, and medical decision making and all procedures in the evaluation of this patient.[name], MD 08/02/15 [phone]

## 2015-07-30 NOTE — ED Notes (Signed)
Patient requested something to drink. Spoke with Dr. Estell Harpin. Per Dr. Estell Harpin patient needs to stay NPO until reevaluation. Pt. Informed. Verbalizes understanding.

## 2015-08-01 LAB — URINE CULTURE

## 2015-08-17 ENCOUNTER — Encounter (HOSPITAL_COMMUNITY): Payer: Self-pay | Admitting: Emergency Medicine

## 2015-08-17 ENCOUNTER — Emergency Department (HOSPITAL_COMMUNITY)
Admission: EM | Admit: 2015-08-17 | Discharge: 2015-08-17 | Disposition: A | Discharge status: Home / Self Care | Payer: Medicaid Other | Attending: Emergency Medicine | Admitting: Emergency Medicine

## 2015-08-17 DIAGNOSIS — Z8659 Personal history of other mental and behavioral disorders: Secondary | ICD-10-CM | POA: Diagnosis not present

## 2015-08-17 DIAGNOSIS — Z79899 Other long term (current) drug therapy: Secondary | ICD-10-CM | POA: Diagnosis not present

## 2015-08-17 DIAGNOSIS — R197 Diarrhea, unspecified: Secondary | ICD-10-CM | POA: Insufficient documentation

## 2015-08-17 DIAGNOSIS — R6883 Chills (without fever): Secondary | ICD-10-CM | POA: Diagnosis not present

## 2015-08-17 DIAGNOSIS — I10 Essential (primary) hypertension: Secondary | ICD-10-CM | POA: Diagnosis not present

## 2015-08-17 DIAGNOSIS — R112 Nausea with vomiting, unspecified: Secondary | ICD-10-CM | POA: Diagnosis present

## 2015-08-17 DIAGNOSIS — R109 Unspecified abdominal pain: Secondary | ICD-10-CM | POA: Diagnosis not present

## 2015-08-17 LAB — URINALYSIS, ROUTINE W REFLEX MICROSCOPIC
Bilirubin Urine: NEGATIVE
Glucose, UA: NEGATIVE mg/dL
Ketones, ur: NEGATIVE mg/dL
Leukocytes, UA: NEGATIVE
Nitrite: NEGATIVE
Protein, ur: 100 mg/dL — AB
Specific Gravity, Urine: 1.025 (ref 1.005–1.030)
Urobilinogen, UA: 1 mg/dL (ref 0.0–1.0)
pH: 7 (ref 5.0–8.0)

## 2015-08-17 LAB — COMPREHENSIVE METABOLIC PANEL
ALT: 24 U/L (ref 14–54)
AST: 28 U/L (ref 15–41)
Albumin: 3.7 g/dL (ref 3.5–5.0)
Alkaline Phosphatase: 65 U/L (ref 38–126)
Anion gap: 7 (ref 5–15)
BUN: 8 mg/dL (ref 6–20)
CO2: 26 mmol/L (ref 22–32)
Calcium: 8.9 mg/dL (ref 8.9–10.3)
Chloride: 107 mmol/L (ref 101–111)
Creatinine, Ser: 0.88 mg/dL (ref 0.44–1.00)
GFR calc Af Amer: >60 mL/min (ref >60)
GFR calc non Af Amer: >60 mL/min (ref >60)
Glucose, Bld: 105 mg/dL — ABNORMAL HIGH (ref 65–99)
Potassium: 3.3 mmol/L — ABNORMAL LOW (ref 3.5–5.1)
Sodium: 140 mmol/L (ref 135–145)
Total Bilirubin: 0.6 mg/dL (ref 0.3–1.2)
Total Protein: 7.6 g/dL (ref 6.5–8.1)

## 2015-08-17 LAB — CBC
HCT: 36.1 % (ref 36.0–46.0)
Hemoglobin: 11.7 g/dL — ABNORMAL LOW (ref 12.0–15.0)
MCH: 28.9 pg (ref 26.0–34.0)
MCHC: 32.4 g/dL (ref 30.0–36.0)
MCV: 89.1 fL (ref 78.0–100.0)
Platelets: 272 10*3/uL (ref 150–400)
RBC: 4.05 MIL/uL (ref 3.87–5.11)
RDW: 14.6 % (ref 11.5–15.5)
WBC: 3.9 10*3/uL — ABNORMAL LOW (ref 4.0–10.5)

## 2015-08-17 LAB — URINE MICROSCOPIC-ADD ON

## 2015-08-17 LAB — LIPASE, BLOOD: Lipase: 20 U/L (ref 11–51)

## 2015-08-17 MED ORDER — LOPERAMIDE HCL 2 MG PO CAPS
4.0000 mg | ORAL_CAPSULE | Freq: Once | ORAL | Status: AC
Start: 2015-08-17 — End: 2015-08-17
  Administered 2015-08-17: 4 mg via ORAL
  Filled 2015-08-17: qty 2

## 2015-08-17 MED ORDER — SODIUM CHLORIDE 0.9 % IV BOLUS (SEPSIS)
1000.0000 mL | Freq: Once | INTRAVENOUS | Status: AC
  Administered 2015-08-17: 1000 mL via INTRAVENOUS

## 2015-08-17 MED ORDER — PROMETHAZINE HCL 12.5 MG PO TABS: 12.5000 mg | ORAL_TABLET | Freq: Four times a day (QID) | ORAL | Status: DC | PRN

## 2015-08-17 MED ORDER — MORPHINE SULFATE (PF) 4 MG/ML IV SOLN
6.0000 mg | Freq: Once | INTRAVENOUS | Status: AC
  Administered 2015-08-17: 6 mg via INTRAVENOUS
  Filled 2015-08-17: qty 2

## 2015-08-17 MED ORDER — ONDANSETRON HCL 4 MG/2ML IJ SOLN
4.0000 mg | Freq: Once | INTRAMUSCULAR | Status: AC
  Administered 2015-08-17: 4 mg via INTRAVENOUS
  Filled 2015-08-17: qty 2

## 2015-08-17 NOTE — ED Notes (Signed)
Patient d/c papers and prescriptions given and reviewed. Patient verbalized understanding. Patient being picked up by friend.

## 2015-08-17 NOTE — ED Notes (Signed)
Pt c/o of n/v/d and abdominal pain x 2 days. Pt unable to tolerate anything PO.

## 2015-08-17 NOTE — ED Provider Notes (Signed)
CSN: [id number]     Arrival date & time 08/17/15  [id number] History   First MD Initiated Contact with Patient 08/17/15 [phone]     Chief Complaint  Patient presents with  . Emesis     (Consider location/radiation/quality/duration/timing/severity/associated sxs/prior Treatment) HPI   54 year old female with nausea, vomiting and diarrhea. Symptom onset about 2 days ago. Crampy abdominal pain along her left side. Waxes and wanes. No appreciable exacerbating relieving factors. Patient has been sipping ginger ale reports that she is unable to even keep this down. Denies fever. Has felt chilled at times. No urinary complaints. No blood in stool or emesis. No sick contacts.  Past Medical History  Diagnosis Date  . Depression   . Anxiety   . Hypertension    Past Surgical History  Procedure Laterality Date  . Cholecystectomy    . Tubal ligation    . Ercp N/A 01/12/2015    Procedure: ENDOSCOPIC RETROGRADE CHOLANGIOPANCREATOGRAPHY (ERCP) ;  Surgeon: Malissa HippoNajeeb U Rehman, MD;  Location: [facility];  Service: Endoscopy;  Laterality: N/A;  Balloon Extraction  . Sphincterotomy N/A 01/12/2015    Procedure: SPHINCTEROTOMY;  Surgeon: Malissa HippoNajeeb U Rehman, MD;  Location: [facility];  Service: Endoscopy;  Laterality: N/A;   Family History  Problem Relation Age of Onset  . Diabetes Mother    Social History  Substance Use Topics  . Smoking status: Never Smoker   . Smokeless tobacco: None  . Alcohol Use: No   OB History    Gravida Para Term Preterm AB TAB SAB Ectopic Multiple Living   3 1 1  2 2          Review of Systems  All systems reviewed and negative, other than as noted in HPI.   Allergies  Ibuprofen  Home Medications   Prior to Admission medications   Medication Sig Start Date End Date Taking? Authorizing Provider  diphenhydramine-acetaminophen (TYLENOL PM) 25-500 MG TABS tablet Take 2 tablets by mouth at bedtime as needed (pain).    Historical Provider, MD  hydrochlorothiazide (HYDRODIURIL) 25 MG  tablet Take 25 mg by mouth daily.     Historical Provider, MD  pantoprazole (PROTONIX) 40 MG tablet Take 40 mg by mouth daily.    Historical Provider, MD   BP 145/85 mmHg  Pulse 83  Temp(Src) 99.6 F (37.6 C) (Oral)  Resp 22  Ht 6\' 1"  (1.854 m)  Wt 226 lb (102.513 kg)  BMI 29.82 kg/m2  SpO2 98%  LMP  (LMP Unknown) Physical Exam  Constitutional: She appears well-developed and well-nourished. No distress.  HENT:  Head: Normocephalic and atraumatic.  Eyes: Conjunctivae are normal. Right eye exhibits no discharge. Left eye exhibits no discharge.  Neck: Neck supple.  Cardiovascular: Normal rate, regular rhythm and normal heart sounds.  Exam reveals no gallop and no friction rub.   No murmur heard. Pulmonary/Chest: Effort normal and breath sounds normal. No respiratory distress.  Abdominal: Soft. She exhibits no distension. There is no tenderness.  Musculoskeletal: She exhibits no edema or tenderness.  Neurological: She is alert.  Skin: Skin is warm and dry.  Psychiatric: She has a normal mood and affect. Her behavior is normal. Thought content normal.  Nursing note and vitals reviewed.   ED Course  Procedures (including critical care time) Labs Review Labs Reviewed  COMPREHENSIVE METABOLIC PANEL - Abnormal; Notable for the following:    Potassium 3.3 (*)    Glucose, Bld 105 (*)    All other components within normal limits  CBC - Abnormal;  Notable for the following:    WBC 3.9 (*)    Hemoglobin 11.7 (*)    All other components within normal limits  URINALYSIS, ROUTINE W REFLEX MICROSCOPIC (NOT AT [facility]) - Abnormal; Notable for the following:    Hgb urine dipstick SMALL (*)    Protein, ur 100 (*)    All other components within normal limits  URINE MICROSCOPIC-ADD ON - Abnormal; Notable for the following:    Bacteria, UA FEW (*)    All other components within normal limits  LIPASE, BLOOD    Imaging Review No results found. I have personally reviewed and evaluated these  images and lab results as part of my medical decision-making.   EKG Interpretation None      MDM   Final diagnoses:  Non-intractable vomiting with nausea, vomiting of unspecified type  Diarrhea, unspecified type    54 year old female with left-sided abdominal pain, nausea/vomiting and diarrhea. Suspect viral illness. Her abdominal exam is benign. Somewhat uncomfortable appearing, but not toxic. She is afebrile and hemodynamically stable. Will check basic labs and treat symptoms. At this time, I have a very low suspicion for acute surgical process or other emergent condition.    Raeford Razor, MD 08/27/15 2119

## 2015-08-17 NOTE — Discharge Instructions (Signed)

## 2015-10-17 ENCOUNTER — Other Ambulatory Visit (HOSPITAL_COMMUNITY): Payer: Self-pay | Admitting: Family Medicine

## 2015-10-17 DIAGNOSIS — R229 Localized swelling, mass and lump, unspecified: Principal | ICD-10-CM

## 2015-10-17 DIAGNOSIS — IMO0002 Reserved for concepts with insufficient information to code with codable children: Secondary | ICD-10-CM

## 2015-11-28 ENCOUNTER — Other Ambulatory Visit (HOSPITAL_COMMUNITY): Payer: Self-pay | Admitting: Family Medicine

## 2015-11-28 ENCOUNTER — Ambulatory Visit (HOSPITAL_COMMUNITY)
Admission: RE | Admit: 2015-11-28 | Discharge: 2015-11-28 | Disposition: A | Discharge status: Home / Self Care | Payer: Medicaid Other | Source: Ambulatory Visit | Attending: Family Medicine | Admitting: Family Medicine

## 2015-11-28 ENCOUNTER — Encounter (HOSPITAL_COMMUNITY): Payer: Self-pay

## 2015-11-28 DIAGNOSIS — N6310 Unspecified lump in the right breast, unspecified quadrant: Secondary | ICD-10-CM

## 2015-11-28 DIAGNOSIS — Z09 Encounter for follow-up examination after completed treatment for conditions other than malignant neoplasm: Secondary | ICD-10-CM

## 2015-11-28 DIAGNOSIS — N63 Unspecified lump in breast: Secondary | ICD-10-CM | POA: Diagnosis not present

## 2016-01-11 ENCOUNTER — Encounter (HOSPITAL_COMMUNITY): Payer: Self-pay | Admitting: Emergency Medicine

## 2016-01-11 ENCOUNTER — Emergency Department (HOSPITAL_COMMUNITY)
Admission: EM | Admit: 2016-01-11 | Discharge: 2016-01-11 | Disposition: A | Discharge status: Home / Self Care | Payer: Medicaid Other | Attending: Emergency Medicine | Admitting: Emergency Medicine

## 2016-01-11 DIAGNOSIS — Z79899 Other long term (current) drug therapy: Secondary | ICD-10-CM | POA: Insufficient documentation

## 2016-01-11 DIAGNOSIS — F329 Major depressive disorder, single episode, unspecified: Secondary | ICD-10-CM | POA: Diagnosis not present

## 2016-01-11 DIAGNOSIS — I10 Essential (primary) hypertension: Secondary | ICD-10-CM | POA: Diagnosis not present

## 2016-01-11 DIAGNOSIS — R21 Rash and other nonspecific skin eruption: Secondary | ICD-10-CM | POA: Insufficient documentation

## 2016-01-11 MED ORDER — TRIAMCINOLONE ACETONIDE 0.1 % EX CREA: 1.0000 "application " | TOPICAL_CREAM | Freq: Two times a day (BID) | CUTANEOUS | Status: DC

## 2016-01-11 NOTE — ED Provider Notes (Signed)
CSN: [id number]     Arrival date & time 01/11/16  1047 History   First MD Initiated Contact with Patient 01/11/16 1119     Chief Complaint  Patient presents with  . Rash     (Consider location/radiation/quality/duration/timing/severity/associated sxs/prior Treatment) Patient is a 55 y.o. female presenting with rash. The history is provided by the patient.  Rash Location:  Torso Torso rash location:  L chest and R chest Quality: dryness and itchiness   Severity:  Mild Onset quality:  Gradual Duration:  3 days Timing:  Constant Progression:  Unchanged Chronicity:  New Context: not animal contact, not chemical exposure, not exposure to similar rash, not medications, not new detergent/soap, not plant contact and not sick contacts   Context comment:  She reports similar rash last year which was diagnosed as eczema and got better with steroid cream Relieved by:  None tried Worsened by:  Nothing tried Ineffective treatments:  None tried Associated symptoms: no abdominal pain, no fever, no joint pain, no myalgias, no sore throat and not wheezing     Past Medical History  Diagnosis Date  . Depression   . Anxiety   . Hypertension    Past Surgical History  Procedure Laterality Date  . Cholecystectomy    . Tubal ligation    . Ercp N/A 01/12/2015    Procedure: ENDOSCOPIC RETROGRADE CHOLANGIOPANCREATOGRAPHY (ERCP) ;  Surgeon: Malissa HippoNajeeb U Rehman, MD;  Location: [facility];  Service: Endoscopy;  Laterality: N/A;  Balloon Extraction  . Sphincterotomy N/A 01/12/2015    Procedure: SPHINCTEROTOMY;  Surgeon: Malissa HippoNajeeb U Rehman, MD;  Location: [facility];  Service: Endoscopy;  Laterality: N/A;   Family History  Problem Relation Age of Onset  . Diabetes Mother    Social History  Substance Use Topics  . Smoking status: Never Smoker   . Smokeless tobacco: None  . Alcohol Use: No   OB History    Gravida Para Term Preterm AB TAB SAB Ectopic Multiple Living   3 1 1  2 2          Review of Systems   Constitutional: Negative for fever.  HENT: Negative for sore throat.   Respiratory: Negative for wheezing.   Gastrointestinal: Negative for abdominal pain.  Musculoskeletal: Negative for myalgias and arthralgias.  Skin: Positive for rash.      Allergies  Ibuprofen  Home Medications   Prior to Admission medications   Medication Sig Start Date End Date Taking? Authorizing Provider  diphenhydramine-acetaminophen (TYLENOL PM) 25-500 MG TABS tablet Take 2 tablets by mouth at bedtime as needed (pain).    Historical Provider, MD  hydrochlorothiazide (HYDRODIURIL) 25 MG tablet Take 25 mg by mouth daily.     Historical Provider, MD  pantoprazole (PROTONIX) 40 MG tablet Take 40 mg by mouth daily.    Historical Provider, MD  promethazine (PHENERGAN) 12.5 MG tablet Take 1 tablet (12.5 mg total) by mouth every 6 (six) hours as needed for nausea or vomiting. 08/17/15   Raeford RazorStephen Kohut, MD  triamcinolone cream (KENALOG) 0.1 % Apply 1 application topically 2 (two) times daily. Apply sparingly to sites of rash twice daily for up to 10 days. 01/11/16   Burgess AmorJulie Benji Poynter, PA-C   BP 127/65 mmHg  Pulse 76  Temp(Src) 98.4 F (36.9 C) (Oral)  Resp 16  Ht 6\' 1"  (1.854 m)  Wt 106.595 kg  BMI 31.01 kg/m2  SpO2 100%  LMP  (LMP Unknown) Physical Exam  Constitutional: She appears well-developed and well-nourished. No distress.  HENT:  Head: Normocephalic.  Neck: Neck supple.  Cardiovascular: Normal rate.   Pulmonary/Chest: Effort normal. She has no wheezes.  Musculoskeletal: Normal range of motion. She exhibits no edema.  Skin: Rash noted. Rash is maculopapular.  Dry appearing small patches of macular rash bilateral chest.  Areas of excoriations.    ED Course  Procedures (including critical care time) Labs Review Labs Reviewed - No data to display  Imaging Review No results found. I have personally reviewed and evaluated these images and lab results as part of my medical decision-making.   EKG  Interpretation None      MDM   Final diagnoses:  Rash    Suspect localized dermatitis/possible eczema.  Triamcinolone bid for up to 10 days.  F/u with pcp if sx persist or worsen.    Burgess Amor, PA-C 01/11/16 1213  Vanetta Mulders, MD 01/12/16 [phone]

## 2016-01-11 NOTE — ED Notes (Signed)
Pt reports rash on chest and neck x3 days.  PT reports itching.

## 2016-02-28 ENCOUNTER — Encounter (HOSPITAL_COMMUNITY): Payer: Self-pay

## 2016-02-28 ENCOUNTER — Emergency Department (HOSPITAL_COMMUNITY): Payer: Medicaid Other

## 2016-02-28 ENCOUNTER — Emergency Department (HOSPITAL_COMMUNITY)
Admission: EM | Admit: 2016-02-28 | Discharge: 2016-02-28 | Disposition: A | Discharge status: Home / Self Care | Payer: Medicaid Other | Attending: Emergency Medicine | Admitting: Emergency Medicine

## 2016-02-28 DIAGNOSIS — R1012 Left upper quadrant pain: Secondary | ICD-10-CM

## 2016-02-28 DIAGNOSIS — I10 Essential (primary) hypertension: Secondary | ICD-10-CM | POA: Diagnosis not present

## 2016-02-28 DIAGNOSIS — Z79899 Other long term (current) drug therapy: Secondary | ICD-10-CM | POA: Insufficient documentation

## 2016-02-28 DIAGNOSIS — F329 Major depressive disorder, single episode, unspecified: Secondary | ICD-10-CM | POA: Insufficient documentation

## 2016-02-28 DIAGNOSIS — R110 Nausea: Secondary | ICD-10-CM | POA: Diagnosis not present

## 2016-02-28 LAB — COMPREHENSIVE METABOLIC PANEL
ALBUMIN: 3.6 g/dL (ref 3.5–5.0)
ALK PHOS: 90 U/L (ref 38–126)
ALT: 46 U/L (ref 14–54)
AST: 46 U/L — AB (ref 15–41)
Anion gap: 8 (ref 5–15)
BILIRUBIN TOTAL: 0.4 mg/dL (ref 0.3–1.2)
BUN: 10 mg/dL (ref 6–20)
CALCIUM: 8.9 mg/dL (ref 8.9–10.3)
CO2: 25 mmol/L (ref 22–32)
Chloride: 106 mmol/L (ref 101–111)
Creatinine, Ser: 0.85 mg/dL (ref 0.44–1.00)
GFR calc Af Amer: >60 mL/min (ref >60)
GFR calc non Af Amer: >60 mL/min (ref >60)
GLUCOSE: 117 mg/dL — AB (ref 65–99)
Potassium: 3.5 mmol/L (ref 3.5–5.1)
Sodium: 139 mmol/L (ref 135–145)
TOTAL PROTEIN: 7.7 g/dL (ref 6.5–8.1)

## 2016-02-28 LAB — CBC WITH DIFFERENTIAL/PLATELET
BASOS ABS: 0 10*3/uL (ref 0.0–0.1)
BASOS PCT: 1 %
Eosinophils Absolute: 0.2 10*3/uL (ref 0.0–0.7)
Eosinophils Relative: 4 %
HEMATOCRIT: 39 % (ref 36.0–46.0)
HEMOGLOBIN: 12.6 g/dL (ref 12.0–15.0)
Lymphocytes Relative: 41 %
Lymphs Abs: 2.2 10*3/uL (ref 0.7–4.0)
MCH: 28.6 pg (ref 26.0–34.0)
MCHC: 32.3 g/dL (ref 30.0–36.0)
MCV: 88.6 fL (ref 78.0–100.0)
MONOS PCT: 10 %
Monocytes Absolute: 0.6 10*3/uL (ref 0.1–1.0)
NEUTROS ABS: 2.4 10*3/uL (ref 1.7–7.7)
NEUTROS PCT: 44 %
Platelets: 338 10*3/uL (ref 150–400)
RBC: 4.4 MIL/uL (ref 3.87–5.11)
RDW: 15.3 % (ref 11.5–15.5)
WBC: 5.3 10*3/uL (ref 4.0–10.5)

## 2016-02-28 LAB — TROPONIN I: Troponin I: <0.03 ng/mL (ref <0.031)

## 2016-02-28 LAB — URINE MICROSCOPIC-ADD ON

## 2016-02-28 LAB — URINALYSIS, ROUTINE W REFLEX MICROSCOPIC
BILIRUBIN URINE: NEGATIVE
GLUCOSE, UA: NEGATIVE mg/dL
KETONES UR: NEGATIVE mg/dL
Leukocytes, UA: NEGATIVE
Nitrite: NEGATIVE
PH: 5.5 (ref 5.0–8.0)
Protein, ur: 100 mg/dL — AB
Specific Gravity, Urine: >1.03 — ABNORMAL HIGH (ref 1.005–1.030)

## 2016-02-28 LAB — LIPASE, BLOOD: Lipase: 20 U/L (ref 11–51)

## 2016-02-28 LAB — PREGNANCY, URINE: Preg Test, Ur: NEGATIVE

## 2016-02-28 MED ORDER — HYDROCODONE-ACETAMINOPHEN 5-325 MG PO TABS: 1.0000 | ORAL_TABLET | ORAL | Status: DC | PRN

## 2016-02-28 MED ORDER — MORPHINE SULFATE (PF) 4 MG/ML IV SOLN
4.0000 mg | Freq: Once | INTRAVENOUS | Status: AC
  Administered 2016-02-28: 4 mg via INTRAVENOUS
  Filled 2016-02-28: qty 1

## 2016-02-28 MED ORDER — ONDANSETRON 8 MG PO TBDP: 8.0000 mg | ORAL_TABLET | Freq: Three times a day (TID) | ORAL | Status: DC | PRN

## 2016-02-28 MED ORDER — PANTOPRAZOLE SODIUM 20 MG PO TBEC: 20.0000 mg | DELAYED_RELEASE_TABLET | Freq: Every day | ORAL | Status: DC

## 2016-02-28 MED ORDER — ONDANSETRON HCL 4 MG/2ML IJ SOLN
4.0000 mg | Freq: Once | INTRAMUSCULAR | Status: AC
  Administered 2016-02-28: 4 mg via INTRAVENOUS
  Filled 2016-02-28: qty 2

## 2016-02-28 NOTE — Discharge Instructions (Signed)
Abdominal Pain, Adult °Many things can cause abdominal pain. Usually, abdominal pain is not caused by a disease and will improve without treatment. It can often be observed and treated at home. Your health care provider will do a physical exam and possibly order blood tests and X-rays to help determine the seriousness of your pain. However, in many cases, more time must pass before a clear cause of the pain can be found. Before that point, your health care provider may not know if you need more testing or further treatment. °HOME CARE INSTRUCTIONS °Monitor your abdominal pain for any changes. The following actions may help to alleviate any discomfort you are experiencing: °· Only take over-the-counter or prescription medicines as directed by your health care provider. °· Do not take laxatives unless directed to do so by your health care provider. °· Try a clear liquid diet (broth, tea, or water) as directed by your health care provider. Slowly move to a bland diet as tolerated. °SEEK MEDICAL CARE IF: °· You have unexplained abdominal pain. °· You have abdominal pain associated with nausea or diarrhea. °· You have pain when you urinate or have a bowel movement. °· You experience abdominal pain that wakes you in the night. °· You have abdominal pain that is worsened or improved by eating food. °· You have abdominal pain that is worsened with eating fatty foods. °· You have a fever. °SEEK IMMEDIATE MEDICAL CARE IF: °· Your pain does not go away within 2 hours. °· You keep throwing up (vomiting). °· Your pain is felt only in portions of the abdomen, such as the right side or the left lower portion of the abdomen. °· You pass bloody or black tarry stools. °MAKE SURE YOU: °· Understand these instructions. °· Will watch your condition. °· Will get help right away if you are not doing well or get worse. °  °This information is not intended to replace advice given to you by your health care provider. Make sure you discuss  any questions you have with your health care provider. °  °Document Released: 07/17/2005 Document Revised: 06/28/2015 Document Reviewed: 06/16/2013 °Elsevier Interactive Patient Education ©2016 [employer] ° ° ° °You may take the hydrocodone prescribed for pain relief.  This will make you drowsy - do not drive within 4 hours of taking this medication. ° °

## 2016-02-28 NOTE — ED Notes (Signed)
Janet Mcguire at bedside. 

## 2016-02-28 NOTE — ED Provider Notes (Signed)
CSN: [id number]     Arrival date & time 02/28/16  0803 History   First MD Initiated Contact with Patient 02/28/16 0805     Chief Complaint  Patient presents with  . Abdominal Pain     (Consider location/radiation/quality/duration/timing/severity/associated sxs/prior Treatment) The history is provided by the patient.   Janet Mcguire is a 55 y.o. female presenting with a 2 day history of left upper quadrant abdominal pain which started within 15 minutes of eating her evening meal of salmon patties 2 nights ago.  She reports intermittent episodes of similar pain in the past of unclear etiology.  She denies fevers or chills, chest pain, shortness of breath, no diarrhea or vomiting but has had mild nausea.  She has a history of acid reflux disease and has recently stopped taking her Protonix but denies any increased reflux symptoms.  She has had no back or flank pain, denies dysuria, hematuria, vaginal discharge or pelvic pain.  Her last bowel movement was yesterday and normal and it did not affect her pain.  She describes all intermittent pain has not been relieved by Alka-Seltzer or ice packs.    Past Medical History  Diagnosis Date  . Depression   . Anxiety   . Hypertension    Past Surgical History  Procedure Laterality Date  . Cholecystectomy    . Tubal ligation    . Ercp N/A 01/12/2015    Procedure: ENDOSCOPIC RETROGRADE CHOLANGIOPANCREATOGRAPHY (ERCP) ;  Surgeon: Malissa HippoNajeeb U Rehman, MD;  Location: [facility];  Service: Endoscopy;  Laterality: N/A;  Balloon Extraction  . Sphincterotomy N/A 01/12/2015    Procedure: SPHINCTEROTOMY;  Surgeon: Malissa HippoNajeeb U Rehman, MD;  Location: [facility];  Service: Endoscopy;  Laterality: N/A;   Family History  Problem Relation Age of Onset  . Diabetes Mother    Social History  Substance Use Topics  . Smoking status: Never Smoker   . Smokeless tobacco: None  . Alcohol Use: No   OB History    Gravida Para Term Preterm AB TAB SAB Ectopic Multiple Living   3 1 1   2 2          Review of Systems  Constitutional: Negative for fever and chills.  HENT: Negative for congestion and sore throat.   Eyes: Negative.   Respiratory: Negative for chest tightness and shortness of breath.   Cardiovascular: Negative for chest pain.  Gastrointestinal: Positive for nausea and abdominal pain. Negative for vomiting, diarrhea, constipation, blood in stool and abdominal distention.  Genitourinary: Negative.  Negative for dysuria and pelvic pain.  Musculoskeletal: Negative for joint swelling, arthralgias and neck pain.  Skin: Negative.  Negative for rash and wound.  Neurological: Negative for dizziness, weakness, light-headedness, numbness and headaches.  Psychiatric/Behavioral: Negative.       Allergies  Ibuprofen  Home Medications   Prior to Admission medications   Medication Sig Start Date End Date Taking? Authorizing Provider  diphenhydramine-acetaminophen (TYLENOL PM) 25-500 MG TABS tablet Take 2 tablets by mouth at bedtime as needed (pain).    Historical Provider, MD  hydrochlorothiazide (HYDRODIURIL) 25 MG tablet Take 25 mg by mouth daily.     Historical Provider, MD  HYDROcodone-acetaminophen (NORCO/VICODIN) 5-325 MG tablet Take 1 tablet by mouth every 4 (four) hours as needed. 02/28/16   Burgess AmorJulie Jhovany Weidinger, PA-C  ondansetron (ZOFRAN ODT) 8 MG disintegrating tablet Take 1 tablet (8 mg total) by mouth every 8 (eight) hours as needed for nausea or vomiting. 02/28/16   Burgess AmorJulie Lacresia Darwish, PA-C  pantoprazole (PROTONIX) 20  MG tablet Take 1 tablet (20 mg total) by mouth daily. 02/28/16   Burgess Amor, PA-C  promethazine (PHENERGAN) 12.5 MG tablet Take 1 tablet (12.5 mg total) by mouth every 6 (six) hours as needed for nausea or vomiting. 08/17/15   Raeford Razor, MD  triamcinolone cream (KENALOG) 0.1 % Apply 1 application topically 2 (two) times daily. Apply sparingly to sites of rash twice daily for up to 10 days. 01/11/16   Burgess Amor, PA-C   BP 130/64 mmHg  Pulse 67  Temp(Src)  98.4 F (36.9 C) (Oral)  Resp 16  Ht  (1.854 m)  Wt 106.595 kg  BMI 31.01 kg/m2  SpO2 98%  LMP  (LMP Unknown) Physical Exam  Constitutional: She appears well-developed and well-nourished.  HENT:  Head: Normocephalic and atraumatic.  Eyes: Conjunctivae are normal.  Neck: Normal range of motion.  Cardiovascular: Normal rate, regular rhythm, normal heart sounds and intact distal pulses.   Pulmonary/Chest: Effort normal and breath sounds normal. She has no wheezes.  Abdominal: Soft. Bowel sounds are normal. She exhibits no distension and no mass. There is no hepatosplenomegaly. There is tenderness in the left upper quadrant. There is no rigidity, no rebound, no guarding and no CVA tenderness.  No increased tympany to percussion.  Musculoskeletal: Normal range of motion.  Neurological: She is alert.  Skin: Skin is warm and dry.  Psychiatric: She has a normal mood and affect.  Nursing note and vitals reviewed.   ED Course  Procedures (including critical care time) Labs Review Labs Reviewed  COMPREHENSIVE METABOLIC PANEL - Abnormal; Notable for the following:    Glucose, Bld 117 (*)    AST 46 (*)    All other components within normal limits  URINALYSIS, ROUTINE W REFLEX MICROSCOPIC (NOT AT [facility]) - Abnormal; Notable for the following:    Specific Gravity, Urine >1.030 (*)    Hgb urine dipstick MODERATE (*)    Protein, ur 100 (*)    All other components within normal limits  URINE MICROSCOPIC-ADD ON - Abnormal; Notable for the following:    Squamous Epithelial / LPF 0-5 (*)    Bacteria, UA FEW (*)    All other components within normal limits  CBC WITH DIFFERENTIAL/PLATELET  LIPASE, BLOOD  PREGNANCY, URINE  TROPONIN I    Imaging Review Dg Abd Acute W/chest  02/28/2016  CLINICAL DATA:  Acute left upper quadrant abdominal pain and nausea. EXAM: DG ABDOMEN ACUTE W/ 1V CHEST COMPARISON:  November 27, 2013. FINDINGS: There is no evidence of dilated bowel loops or free  intraperitoneal air. Surgical staples and clips are noted throughout the abdomen. Phleboliths are noted in the pelvis. Heart size and mediastinal contours are within normal limits. Both lungs are clear. IMPRESSION: No evidence of bowel obstruction or ileus. No acute cardiopulmonary disease. Electronically Signed   By: Lupita Raider, M.D.   On: 02/28/2016 09:22   I have personally reviewed and evaluated these images and lab results as part of my medical decision-making.   EKG Interpretation   Date/Time:  Wednesday Feb 28 2016 08:33:26 EDT Ventricular Rate:  74 PR Interval:  161 QRS Duration: 89 QT Interval:  392 QTC Calculation: 435 R Axis:   -23 Text Interpretation:  Sinus rhythm Borderline left axis deviation  Confirmed by ZAVITZ MD, JOSHUA [phone]) on 02/28/2016 9:04:41 AM      MDM   Final diagnoses:  Left upper quadrant pain    Patients labs reviewed.  Radiological studies were viewed, interpreted and  considered during the medical decision making and disposition process. I agree with radiologists reading.  Results were also discussed with patient.   Patient with history of intermittent left upper quadrant pain with normal labs and imaging today.  Negative troponin and EKG, doubt atypical presentation of ACS.  Patient was prescribed Zofran, advised to start back her Protonix and also prescribed a few hydrocodone.  Advise follow-up with her PCP for recheck if symptoms are not improved with this plan.  Also advised return here for any worsened symptoms which were discussed.  The patient appears reasonably screened and/or stabilized for discharge and I doubt any other medical condition or other [facility] requiring further screening, evaluation, or treatment in the ED at this time prior to discharge.     Burgess Amor, PA-C 02/28/16 1031  Blane Ohara, MD 02/28/16 [phone]

## 2016-02-28 NOTE — ED Notes (Signed)
Complain of abdominal pain and nausea that started two days ago

## 2016-02-28 NOTE — ED Notes (Signed)
Pt unable to give urine specimen at this time 

## 2016-02-28 NOTE — ED Notes (Signed)
Pt given fluids.  

## 2016-04-11 ENCOUNTER — Encounter (HOSPITAL_COMMUNITY): Payer: Self-pay | Admitting: Emergency Medicine

## 2016-04-11 ENCOUNTER — Emergency Department (HOSPITAL_COMMUNITY)
Admission: EM | Admit: 2016-04-11 | Discharge: 2016-04-11 | Disposition: A | Discharge status: Home / Self Care | Payer: Medicaid Other | Attending: Emergency Medicine | Admitting: Emergency Medicine

## 2016-04-11 DIAGNOSIS — Y929 Unspecified place or not applicable: Secondary | ICD-10-CM | POA: Diagnosis not present

## 2016-04-11 DIAGNOSIS — Y999 Unspecified external cause status: Secondary | ICD-10-CM | POA: Diagnosis not present

## 2016-04-11 DIAGNOSIS — W57XXXA Bitten or stung by nonvenomous insect and other nonvenomous arthropods, initial encounter: Secondary | ICD-10-CM | POA: Insufficient documentation

## 2016-04-11 DIAGNOSIS — I10 Essential (primary) hypertension: Secondary | ICD-10-CM | POA: Insufficient documentation

## 2016-04-11 DIAGNOSIS — Y939 Activity, unspecified: Secondary | ICD-10-CM | POA: Insufficient documentation

## 2016-04-11 DIAGNOSIS — S30861A Insect bite (nonvenomous) of abdominal wall, initial encounter: Secondary | ICD-10-CM | POA: Insufficient documentation

## 2016-04-11 DIAGNOSIS — F329 Major depressive disorder, single episode, unspecified: Secondary | ICD-10-CM | POA: Insufficient documentation

## 2016-04-11 NOTE — Discharge Instructions (Signed)
Tick Bite Information °Ticks are insects that attach themselves to the skin. There are many types of ticks. Common types include wood ticks and deer ticks. Sometimes, ticks carry diseases that can make a person very ill. The most common places for ticks to attach themselves are the scalp, neck, armpits, waist, and groin.  °HOW CAN YOU PREVENT TICK BITES? °Take these steps to help prevent tick bites when you are outdoors: °· Wear long sleeves and long pants. °· Wear white clothes so you can see ticks more easily. °· Tuck your pant legs into your socks. °· If walking on a trail, stay in the middle of the trail to avoid brushing against bushes. °· Avoid walking through areas with long grass. °· Put bug spray on all skin that is showing and along boot tops, pant legs, and sleeve cuffs. °· Check clothes, hair, and skin often and before going inside. °· Brush off any ticks that are not attached. °· Take a shower or bath as soon as possible after being outdoors. °HOW SHOULD YOU REMOVE A TICK? °Ticks should be removed as soon as possible to help prevent diseases. °1. If latex gloves are available, put them on before trying to remove a tick. °2. Use tweezers to grasp the tick as close to the skin as possible. You may also use curved forceps or a tick removal tool. Grasp the tick as close to its head as possible. Avoid grasping the tick on its body. °3. Pull gently upward until the tick lets go. Do not twist the tick or jerk it suddenly. This may break off the tick's head or mouth parts. °4. Do not squeeze or crush the tick's body. This could force disease-carrying fluids from the tick into your body. °5. After the tick is removed, wash the bite area and your hands with soap and water or alcohol. °6. Apply a small amount of antiseptic cream or ointment to the bite site. °7. Wash any tools that were used. °Do not try to remove a tick by applying a hot match, petroleum jelly, or fingernail polish to the tick. These methods do  not work. They may also increase the chances of disease being spread from the tick bite. °WHEN SHOULD YOU SEEK HELP? °Contact your health care provider if you are unable to remove a tick or if a part of the tick breaks off in the skin. °After a tick bite, you need to watch for signs and symptoms of diseases that can be spread by ticks. Contact your health care provider if you develop any of the following: °· Fever. °· Rash. °· Redness and puffiness (swelling) in the area of the tick bite. °· Tender, puffy lymph glands. °· Watery poop (diarrhea). °· Weight loss. °· Cough. °· Feeling more tired than normal (fatigue). °· Muscle, joint, or bone pain. °· Belly (abdominal) pain. °· Headache. °· Change in your level of consciousness. °· Trouble walking or moving your legs. °· Loss of feeling (numbness) in the legs. °· Loss of movement (paralysis). °· Shortness of breath. °· Confusion. °· Throwing up (vomiting) many times. °  °This information is not intended to replace advice given to you by your health care provider. Make sure you discuss any questions you have with your health care provider. °  °Document Released: 01/01/2010 Document Revised: 06/09/2013 Document Reviewed: 03/17/2013 °Elsevier Interactive Patient Education ©2016 [employer] ° °

## 2016-04-11 NOTE — ED Notes (Signed)
Pt reports removing a tick from her abdomen today. Denies any rashes or fever. Pt has tick with her.

## 2016-04-13 NOTE — ED Provider Notes (Signed)
CSN: [id number]     Arrival date & time 04/11/16  1200 History   First MD Initiated Contact with Patient 04/11/16 1235     Chief Complaint  Patient presents with  . Insect Bite     (Consider location/radiation/quality/duration/timing/severity/associated sxs/prior Treatment) HPI   Janet Mcguire is a 55 y.o. female who presents to the Emergency Department requesting evaluation of a recent tick bite to her abdomen.  She is unable if she removed the tick completely.  She denies rash, joint pain, and fever.     Past Medical History  Diagnosis Date  . Depression   . Anxiety   . Hypertension    Past Surgical History  Procedure Laterality Date  . Cholecystectomy    . Tubal ligation    . Ercp N/A 01/12/2015    Procedure: ENDOSCOPIC RETROGRADE CHOLANGIOPANCREATOGRAPHY (ERCP) ;  Surgeon: Malissa HippoNajeeb U Rehman, MD;  Location: [facility];  Service: Endoscopy;  Laterality: N/A;  Balloon Extraction  . Sphincterotomy N/A 01/12/2015    Procedure: SPHINCTEROTOMY;  Surgeon: Malissa HippoNajeeb U Rehman, MD;  Location: [facility];  Service: Endoscopy;  Laterality: N/A;   Family History  Problem Relation Age of Onset  . Diabetes Mother    Social History  Substance Use Topics  . Smoking status: Never Smoker   . Smokeless tobacco: None  . Alcohol Use: No   OB History    Gravida Para Term Preterm AB TAB SAB Ectopic Multiple Living   3 1 1  2 2          Review of Systems  Constitutional: Negative for fever, chills, activity change and appetite change.  HENT: Negative for facial swelling, sore throat and trouble swallowing.   Respiratory: Negative for chest tightness, shortness of breath and wheezing.   Musculoskeletal: Negative for neck pain and neck stiffness.  Skin: Negative for wound.       Tick bite  Neurological: Negative for dizziness, weakness, numbness and headaches.  All other systems reviewed and are negative.     Allergies  Ibuprofen  Home Medications   Prior to Admission medications   Medication  Sig Start Date End Date Taking? Authorizing Provider  diphenhydramine-acetaminophen (TYLENOL PM) 25-500 MG TABS tablet Take 2 tablets by mouth at bedtime as needed (pain).    Historical Provider, MD  hydrochlorothiazide (HYDRODIURIL) 25 MG tablet Take 25 mg by mouth daily.     Historical Provider, MD  HYDROcodone-acetaminophen (NORCO/VICODIN) 5-325 MG tablet Take 1 tablet by mouth every 4 (four) hours as needed. 02/28/16   Burgess AmorJulie Idol, PA-C  ondansetron (ZOFRAN ODT) 8 MG disintegrating tablet Take 1 tablet (8 mg total) by mouth every 8 (eight) hours as needed for nausea or vomiting. 02/28/16   Burgess AmorJulie Idol, PA-C  pantoprazole (PROTONIX) 20 MG tablet Take 1 tablet (20 mg total) by mouth daily. 02/28/16   Burgess AmorJulie Idol, PA-C  promethazine (PHENERGAN) 12.5 MG tablet Take 1 tablet (12.5 mg total) by mouth every 6 (six) hours as needed for nausea or vomiting. 08/17/15   Raeford RazorStephen Kohut, MD  triamcinolone cream (KENALOG) 0.1 % Apply 1 application topically 2 (two) times daily. Apply sparingly to sites of rash twice daily for up to 10 days. 01/11/16   Burgess AmorJulie Idol, PA-C   BP 139/81 mmHg  Pulse 67  Temp(Src) 98.6 F (37 C) (Oral)  Resp 18  Ht 6\' 1"  (1.854 m)  Wt 106.595 kg  BMI 31.01 kg/m2  SpO2 100%  LMP  (LMP Unknown) Physical Exam  Constitutional: She is oriented to person,  place, and time. She appears well-developed and well-nourished. No distress.  HENT:  Head: Atraumatic.  Neck: Normal range of motion. Neck supple.  Cardiovascular: Normal rate, regular rhythm and intact distal pulses.   Pulmonary/Chest: Effort normal. No respiratory distress.  Musculoskeletal: Normal range of motion.  Neurological: She is alert and oriented to person, place, and time.  Skin: Skin is warm. No rash noted.  Single erythematous papule to to umbilicus.  No target lesion o other rash  Psychiatric: She has a normal mood and affect.    ED Course  Procedures (including critical care time) Labs Review Labs Reviewed - No  data to display  Imaging Review No results found. I have personally reviewed and evaluated these images and lab results as part of my medical decision-making.   EKG Interpretation None      MDM   Final diagnoses:  Tick bite    Pt well appearing.  Asymptomatic.  Agrees to topical OTC hydrocortisone cream, bendaryl if needed for itching, and return precautions given.      Pauline Ausammy Stephanee Barcomb, PA-C 04/13/16 2125  Lavera Guiseana Duo Liu, MD 04/16/16 [phone]

## 2016-05-11 ENCOUNTER — Encounter (HOSPITAL_COMMUNITY): Payer: Self-pay | Admitting: Emergency Medicine

## 2016-05-11 ENCOUNTER — Emergency Department (HOSPITAL_COMMUNITY)
Admission: EM | Admit: 2016-05-11 | Discharge: 2016-05-11 | Disposition: A | Discharge status: Home / Self Care | Payer: Medicaid Other | Attending: Emergency Medicine | Admitting: Emergency Medicine

## 2016-05-11 DIAGNOSIS — K047 Periapical abscess without sinus: Secondary | ICD-10-CM | POA: Diagnosis not present

## 2016-05-11 DIAGNOSIS — I10 Essential (primary) hypertension: Secondary | ICD-10-CM | POA: Insufficient documentation

## 2016-05-11 DIAGNOSIS — K0889 Other specified disorders of teeth and supporting structures: Secondary | ICD-10-CM | POA: Diagnosis present

## 2016-05-11 DIAGNOSIS — F329 Major depressive disorder, single episode, unspecified: Secondary | ICD-10-CM | POA: Diagnosis not present

## 2016-05-11 DIAGNOSIS — F32A Depression, unspecified: Secondary | ICD-10-CM

## 2016-05-11 MED ORDER — PENICILLIN V POTASSIUM 500 MG PO TABS: 500.0000 mg | ORAL_TABLET | Freq: Four times a day (QID) | ORAL | Status: AC

## 2016-05-11 NOTE — ED Provider Notes (Signed)
CSN: [id number]     Arrival date & time 05/11/16  9509 History   First MD Initiated Contact with Patient 05/11/16 [phone]     Chief Complaint  Patient presents with  . Dental Pain     (Consider location/radiation/quality/duration/timing/severity/associated sxs/prior Treatment) HPI  55 year old female who presents with right upper dental pain. Has had pain in her right upper jaw over the past week, and noted some right cheek swelling this morning. No fevers, chills, dysphagia, odynophagia, swallowing saliva, shortness of breath. Also states depression, which she is seeing counselor for, next appointment in 4 days. No SI/HI/AVH.   Past Medical History  Diagnosis Date  . Depression   . Anxiety   . Hypertension    Past Surgical History  Procedure Laterality Date  . Cholecystectomy    . Tubal ligation    . Ercp N/A 01/12/2015    Procedure: ENDOSCOPIC RETROGRADE CHOLANGIOPANCREATOGRAPHY (ERCP) ;  Surgeon: Malissa Hippo, MD;  Location: [facility];  Service: Endoscopy;  Laterality: N/A;  Balloon Extraction  . Sphincterotomy N/A 01/12/2015    Procedure: SPHINCTEROTOMY;  Surgeon: Malissa Hippo, MD;  Location: [facility];  Service: Endoscopy;  Laterality: N/A;   Family History  Problem Relation Age of Onset  . Diabetes Mother    Social History  Substance Use Topics  . Smoking status: Never Smoker   . Smokeless tobacco: None  . Alcohol Use: No   OB History    Gravida Para Term Preterm AB TAB SAB Ectopic Multiple Living   3 1 1  2 2          Review of Systems  Constitutional: Negative for fever.  HENT: Negative for sore throat.   Respiratory: Negative for shortness of breath.   Gastrointestinal: Negative for nausea and vomiting.  Allergic/Immunologic: Negative for immunocompromised state.  All other systems reviewed and are negative.     Allergies  Ibuprofen  Home Medications   Prior to Admission medications   Medication Sig Start Date End Date Taking? Authorizing Provider   diphenhydramine-acetaminophen (TYLENOL PM) 25-500 MG TABS tablet Take 2 tablets by mouth at bedtime as needed (pain).    Historical Provider, MD  hydrochlorothiazide (HYDRODIURIL) 25 MG tablet Take 25 mg by mouth daily.     Historical Provider, MD  HYDROcodone-acetaminophen (NORCO/VICODIN) 5-325 MG tablet Take 1 tablet by mouth every 4 (four) hours as needed. 02/28/16   Burgess Amor, PA-C  ondansetron (ZOFRAN ODT) 8 MG disintegrating tablet Take 1 tablet (8 mg total) by mouth every 8 (eight) hours as needed for nausea or vomiting. 02/28/16   Burgess Amor, PA-C  pantoprazole (PROTONIX) 20 MG tablet Take 1 tablet (20 mg total) by mouth daily. 02/28/16   Burgess Amor, PA-C  promethazine (PHENERGAN) 12.5 MG tablet Take 1 tablet (12.5 mg total) by mouth every 6 (six) hours as needed for nausea or vomiting. 08/17/15   Raeford Razor, MD  triamcinolone cream (KENALOG) 0.1 % Apply 1 application topically 2 (two) times daily. Apply sparingly to sites of rash twice daily for up to 10 days. 01/11/16   Burgess Amor, PA-C   BP 135/74 mmHg  Pulse 98  Temp(Src) 98.4 F (36.9 C) (Oral)  Resp 18  Ht 6\' 1"  (1.854 m)  Wt 242 lb (109.77 kg)  BMI 31.93 kg/m2  SpO2 100%  LMP  (LMP Unknown) Physical Exam Physical Exam  Nursing note and vitals reviewed. Constitutional: Well developed, well nourished, non-toxic, and in no acute distress Head: Normocephalic and atraumatic.  Mouth/Throat: Oropharynx is  clear and moist. Poor dentition. Tenderness to palpation and percussion of tooth #1. There is mild gingival swelling without area of fluctuance or underlying abscess. Some mild right maxillary s soft tissue swelling. No posterior oropharyngeal swelling. Neck: Normal range of motion. Neck supple.  Cardiovascular: Normal rate and regular rhythm.   Pulmonary/Chest: Effort normal and breath sounds normal.  Abdominal: Soft. There is no tenderness. There is no rebound and no guarding.  Musculoskeletal: Normal range of motion.   Neurological: Alert, no facial droop, fluent speech, moves all extremities symmetrically Skin: Skin is warm and dry.  Psychiatric: Cooperative  ED Course  Procedures (including critical care time) Labs Review Labs Reviewed - No data to display  Imaging Review No results found. I have personally reviewed and evaluated these images and lab results as part of my medical decision-making.   EKG Interpretation None      MDM   Final diagnoses:  Dental abscess  Depression   With dental abscess to tooth number 1. Afebrile w/o systemic signs or symptoms of infection. Posterior oropharynx is clear. Not concerning for deep neck soft tissue infection. Patient also depressed, but without any concerns for suicidal or homicidal ideations or acute psychosis. She is felt to be still appropriate for continued outpatient treatment with her counselor. We will give course of penicillin for dental abscess and also given dental clinic resources. Strict return and follow-up instructions reviewed. She expressed understanding of all discharge instructions and felt comfortable with the plan of care.     Lavera Guise, MD 05/11/16 [phone]

## 2016-05-11 NOTE — Discharge Instructions (Signed)
You're given dental resources. Please set up follow-up with dentist for reevaluation especially after antibiotics. Return without fail for worsening symptoms, including worsening swelling, fever, difficulty breathing, or any other symptoms concerning to you.  Dental Pain Dental pain may be caused by many things, including:  Tooth decay (cavities or caries). Cavities expose the nerve of your tooth to air and hot or cold temperatures. This can cause pain or discomfort.  Abscess or infection. A dental abscess is a collection of infected pus from a bacterial infection in the inner part of the tooth (pulp). It usually occurs at the end of the tooth's root.  Injury.  An unknown reason (idiopathic). Your pain may be mild or severe. It may only occur when:  You are chewing.  You are exposed to hot or cold temperature.  You are eating or drinking sugary foods or beverages, such as soda or candy. Your pain may also be constant. HOME CARE INSTRUCTIONS Watch your dental pain for any changes. The following actions may help to lessen any discomfort that you are feeling:  Take medicines only as directed by your dentist.  If you were prescribed an antibiotic medicine, finish all of it even if you start to feel better.  Keep all follow-up visits as directed by your dentist. This is important.  Do not apply heat to the outside of your face.  Rinse your mouth or gargle with salt water if directed by your dentist. This helps with pain and swelling.  You can make salt water by adding  tsp of salt to 1 cup of warm water.  Apply ice to the painful area of your face:  Put ice in a plastic bag.  Place a towel between your skin and the bag.  Leave the ice on for 20 minutes, 2-3 times per day.  Avoid foods or drinks that cause you pain, such as:  Very hot or very cold foods or drinks.  Sweet or sugary foods or drinks. SEEK MEDICAL CARE IF:  Your pain is not controlled with medicines.  Your  symptoms are worse.  You have new symptoms. SEEK IMMEDIATE MEDICAL CARE IF:  You are unable to open your mouth.  You are having trouble breathing or swallowing.  You have a fever.  Your face, neck, or jaw is swollen.   This information is not intended to replace advice given to you by your health care provider. Make sure you discuss any questions you have with your health care provider.   Document Released: 10/07/2005 Document Revised: 02/21/2015 Document Reviewed: 10/03/2014 Elsevier Interactive Patient Education [employer]

## 2016-05-11 NOTE — ED Notes (Signed)
C/o dental pain top right side, swelling noted.  Pt is tearful during triage stating, "I am depressed"  Pt denies HI/SI.

## 2016-07-02 ENCOUNTER — Emergency Department (HOSPITAL_COMMUNITY)
Admission: EM | Admit: 2016-07-02 | Discharge: 2016-07-02 | Disposition: A | Discharge status: Home / Self Care | Payer: Medicaid Other | Attending: Dermatology | Admitting: Dermatology

## 2016-07-02 ENCOUNTER — Encounter (HOSPITAL_COMMUNITY): Payer: Self-pay | Admitting: Emergency Medicine

## 2016-07-02 DIAGNOSIS — R51 Headache: Secondary | ICD-10-CM | POA: Diagnosis not present

## 2016-07-02 DIAGNOSIS — Z5321 Procedure and treatment not carried out due to patient leaving prior to being seen by health care provider: Secondary | ICD-10-CM | POA: Insufficient documentation

## 2016-07-02 DIAGNOSIS — I10 Essential (primary) hypertension: Secondary | ICD-10-CM | POA: Diagnosis not present

## 2016-07-02 DIAGNOSIS — Z79899 Other long term (current) drug therapy: Secondary | ICD-10-CM | POA: Insufficient documentation

## 2016-07-02 NOTE — ED Triage Notes (Signed)
Pt c/o ha today. States she has been out of her blood pressure medication x 5 days. Has appointment with new pcp on 07/12/16.

## 2016-07-04 ENCOUNTER — Encounter (HOSPITAL_COMMUNITY): Payer: Self-pay

## 2016-07-04 ENCOUNTER — Emergency Department (HOSPITAL_COMMUNITY)
Admission: EM | Admit: 2016-07-04 | Discharge: 2016-07-04 | Disposition: A | Discharge status: Home / Self Care | Payer: Medicaid Other | Attending: Emergency Medicine | Admitting: Emergency Medicine

## 2016-07-04 DIAGNOSIS — M436 Torticollis: Secondary | ICD-10-CM

## 2016-07-04 DIAGNOSIS — M542 Cervicalgia: Secondary | ICD-10-CM | POA: Diagnosis present

## 2016-07-04 DIAGNOSIS — R110 Nausea: Secondary | ICD-10-CM | POA: Diagnosis not present

## 2016-07-04 DIAGNOSIS — K5900 Constipation, unspecified: Secondary | ICD-10-CM | POA: Insufficient documentation

## 2016-07-04 DIAGNOSIS — I10 Essential (primary) hypertension: Secondary | ICD-10-CM | POA: Diagnosis not present

## 2016-07-04 DIAGNOSIS — Z79899 Other long term (current) drug therapy: Secondary | ICD-10-CM | POA: Diagnosis not present

## 2016-07-04 DIAGNOSIS — R51 Headache: Secondary | ICD-10-CM | POA: Insufficient documentation

## 2016-07-04 MED ORDER — HYDROCODONE-ACETAMINOPHEN 5-325 MG PO TABS
1.0000 | ORAL_TABLET | Freq: Once | ORAL | Status: AC
  Administered 2016-07-04: 1 via ORAL
  Filled 2016-07-04: qty 1

## 2016-07-04 MED ORDER — ONDANSETRON HCL 4 MG PO TABS
4.0000 mg | ORAL_TABLET | Freq: Once | ORAL | Status: AC
  Administered 2016-07-04: 4 mg via ORAL
  Filled 2016-07-04: qty 1

## 2016-07-04 MED ORDER — HYDROCHLOROTHIAZIDE 12.5 MG PO TABS: 12.5000 mg | ORAL_TABLET | Freq: Every day | ORAL | 0 refills | Status: DC

## 2016-07-04 MED ORDER — DIAZEPAM 5 MG PO TABS
10.0000 mg | ORAL_TABLET | Freq: Once | ORAL | Status: AC
  Administered 2016-07-04: 10 mg via ORAL
  Filled 2016-07-04: qty 2

## 2016-07-04 MED ORDER — CYCLOBENZAPRINE HCL 10 MG PO TABS: 10.0000 mg | ORAL_TABLET | Freq: Three times a day (TID) | ORAL | 0 refills | Status: DC

## 2016-07-04 NOTE — Discharge Instructions (Signed)
History. To your shoulder and neck may be helpful. Please use Flexeril 3 times daily for spasm pain. This medication may cause drowsiness, please use with caution. Please have your primary physician monitor your blood pressure closely. Usual blood pressure medication as prescribed.

## 2016-07-04 NOTE — ED Provider Notes (Signed)
[facility] Provider Note   CSN: [id number] Arrival date & time: 07/04/16  4098     History   Chief Complaint Chief Complaint  Patient presents with  . Headache  . Abdominal Pain    HPI Janet Mcguire is a 55 y.o. female.  Patient is a 55 year old female who presents to the emergency department with left neck and head pain.  The patient states that she started having problem with this pain on yesterday. She aggravates the pain when she moves her head and neck in a certain position. She denies any difficulty with swallowing any difficulty with speaking. She is not dropping objects. She's had no falls, and has been no loss of consciousness reported. She states that the pain starts feeling like a tightness, and then turns into a sharp pain from her shoulder to her neck and going up behind the left ear. She's not had any injury or trauma to be reported.  The patient also states that she had some abdominal pain this morning while having a bowel movement, she states that this feels better after having had the bowel movement. She did note some mild to moderate constipation this morning.   The history is provided by the patient.    Past Medical History:  Diagnosis Date  . Anxiety   . Depression   . Hypertension     Patient Active Problem List   Diagnosis Date Noted  . Elevated liver enzymes 01/11/2015  . Essential hypertension 01/11/2015  . Hypokalemia 01/11/2015  . Depression 01/11/2015  . History of cholecystectomy 01/11/2015    Past Surgical History:  Procedure Laterality Date  . CHOLECYSTECTOMY    . ERCP N/A 01/12/2015   Procedure: ENDOSCOPIC RETROGRADE CHOLANGIOPANCREATOGRAPHY (ERCP) ;  Surgeon: Malissa Hippo, MD;  Location: [facility];  Service: Endoscopy;  Laterality: N/A;  Balloon Extraction  . SPHINCTEROTOMY N/A 01/12/2015   Procedure: SPHINCTEROTOMY;  Surgeon: Malissa Hippo, MD;  Location: [facility];  Service: Endoscopy;  Laterality: N/A;  . TUBAL LIGATION       OB History    Gravida Para Term Preterm AB Living   3 1 1   2      SAB TAB Ectopic Multiple Live Births     2             Home Medications    Prior to Admission medications   Medication Sig Start Date End Date Taking? Authorizing Provider  diphenhydramine-acetaminophen (TYLENOL PM) 25-500 MG TABS tablet Take 2 tablets by mouth at bedtime as needed (pain).    Historical Provider, MD  hydrochlorothiazide (HYDRODIURIL) 25 MG tablet Take 25 mg by mouth daily.     Historical Provider, MD  HYDROcodone-acetaminophen (NORCO/VICODIN) 5-325 MG tablet Take 1 tablet by mouth every 4 (four) hours as needed. 02/28/16   Burgess Amor, PA-C  ondansetron (ZOFRAN ODT) 8 MG disintegrating tablet Take 1 tablet (8 mg total) by mouth every 8 (eight) hours as needed for nausea or vomiting. 02/28/16   Burgess Amor, PA-C  pantoprazole (PROTONIX) 20 MG tablet Take 1 tablet (20 mg total) by mouth daily. 02/28/16   Burgess Amor, PA-C  promethazine (PHENERGAN) 12.5 MG tablet Take 1 tablet (12.5 mg total) by mouth every 6 (six) hours as needed for nausea or vomiting. 08/17/15   Raeford Razor, MD  triamcinolone cream (KENALOG) 0.1 % Apply 1 application topically 2 (two) times daily. Apply sparingly to sites of rash twice daily for up to 10 days. 01/11/16   Burgess Amor, PA-C  Family History Family History  Problem Relation Age of Onset  . Diabetes Mother     Social History Social History  Substance Use Topics  . Smoking status: Never Smoker  . Smokeless tobacco: Never Used  . Alcohol use No     Allergies   Ibuprofen   Review of Systems Review of Systems  Gastrointestinal: Positive for constipation and nausea. Negative for vomiting.  Musculoskeletal: Positive for neck pain.       Shoulder pain  Neurological: Positive for headaches.  Psychiatric/Behavioral: The patient is nervous/anxious.   All other systems reviewed and are negative.    Physical Exam Updated Vital Signs BP 144/75 (BP Location: Right  Arm)   Pulse 66   Temp 98.2 F (36.8 C) (Oral)   Resp 18   Ht 6\' 1"  (1.854 m)   Wt 107.5 kg   LMP  (LMP Unknown) Comment: last  one years ago   SpO2 100%   BMI 31.27 kg/m   Physical Exam  Constitutional: She is oriented to person, place, and time. Vital signs are normal. She appears well-developed and well-nourished. She is active.  HENT:  Head: Normocephalic and atraumatic.  Right Ear: Tympanic membrane, external ear and ear canal normal.  Left Ear: Tympanic membrane, external ear and ear canal normal.  Nose: Nose normal.  Mouth/Throat: Uvula is midline, oropharynx is clear and moist and mucous membranes are normal.  There is no increased redness or swelling involving the mastoids.  Eyes: Conjunctivae, EOM and lids are normal. Pupils are equal, round, and reactive to light.  Neck: Trachea normal, normal range of motion and phonation normal. Neck supple. Carotid bruit is not present.  No cervical lymphadenopathy.  Cardiovascular: Normal rate, regular rhythm and normal pulses.   Abdominal: Soft. Normal appearance and bowel sounds are normal.  Musculoskeletal: Normal range of motion. She exhibits no edema.  There is pain and spasm of the upper trapezius on the left, extending into the neck.  Lymphadenopathy:       Head (right side): No submental, no preauricular and no posterior auricular adenopathy present.       Head (left side): No submental, no preauricular and no posterior auricular adenopathy present.    She has no cervical adenopathy.  Neurological: She is alert and oriented to person, place, and time. She has normal strength. No cranial nerve deficit or sensory deficit. She exhibits normal muscle tone. Coordination normal. GCS eye subscore is 4. GCS verbal subscore is 5. GCS motor subscore is 6.  Grip is symmetrical. There is no motor or sensory deficits appreciated on the upper or lower extremities. Gait is steady and intact.  Skin: Skin is warm and dry.  Psychiatric: Her  speech is normal.     ED Treatments / Results  Labs (all labs ordered are listed, but only abnormal results are displayed) Labs Reviewed - No data to display  EKG  EKG Interpretation None       Radiology No results found.  Procedures Procedures (including critical care time)  Medications Ordered in ED Medications - No data to display   Initial Impression / Assessment and Plan / ED Course  I have reviewed the triage vital signs and the nursing notes.  Pertinent labs & imaging results that were available during my care of the patient were reviewed by me and considered in my medical decision making (see chart for details).  Clinical Course   Recheck - Patient feels much improved after medication for pain. Patient is concerned  for her blood pressure, and requesting refill of her hydrochlorothiazide. Prescription will be provided. No gross neurologic deficits. Patient is a ventilatory in the room and hall without problem.  **I have reviewed nursing notes, vital signs, and all appropriate lab and imaging results for this patient.*  Final Clinical Impressions(s) / ED Diagnoses  Patient states she will be followed up with physician on the 22nd of this month. She requested refill of her hydrochlorothiazide 12.5 mg. This has been provided. 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 Patient is to return to emergency department if any changes, problems, or concerns.    Final diagnoses:  None    New Prescriptions New Prescriptions   No medications on file     Ivery QualeHobson Terrian Sentell, PA-C 07/04/16 1058    Glynn OctaveStephen Rancour, MD 07/04/16 1730

## 2016-07-04 NOTE — ED Triage Notes (Signed)
Pt reports pain in left side of neck and head since yesterday.  Also c/o abd pain that started while having a bm this morning.  Reports felt constipated this morning.

## 2016-08-06 ENCOUNTER — Encounter (HOSPITAL_COMMUNITY): Payer: Self-pay | Admitting: Emergency Medicine

## 2016-08-06 ENCOUNTER — Emergency Department (HOSPITAL_COMMUNITY)
Admission: EM | Admit: 2016-08-06 | Discharge: 2016-08-06 | Disposition: A | Discharge status: Home / Self Care | Payer: Medicaid Other | Attending: Emergency Medicine | Admitting: Emergency Medicine

## 2016-08-06 DIAGNOSIS — Z79899 Other long term (current) drug therapy: Secondary | ICD-10-CM | POA: Insufficient documentation

## 2016-08-06 DIAGNOSIS — L02412 Cutaneous abscess of left axilla: Secondary | ICD-10-CM | POA: Diagnosis present

## 2016-08-06 DIAGNOSIS — L739 Follicular disorder, unspecified: Secondary | ICD-10-CM | POA: Diagnosis not present

## 2016-08-06 DIAGNOSIS — I10 Essential (primary) hypertension: Secondary | ICD-10-CM | POA: Diagnosis not present

## 2016-08-06 MED ORDER — SULFAMETHOXAZOLE-TRIMETHOPRIM 800-160 MG PO TABS: 1.0000 | ORAL_TABLET | Freq: Two times a day (BID) | ORAL | 0 refills | Status: AC

## 2016-08-06 NOTE — ED Notes (Signed)
Pt made aware to return if symptoms worsen or if any life threatening symptoms occur.   

## 2016-08-06 NOTE — ED Triage Notes (Signed)
Pt c/o "bumps" under left axilla x 3 days. Denies fever/chills/n/v.

## 2016-08-06 NOTE — ED Provider Notes (Signed)
[facility] Provider Note   CSN: [id number] Arrival date & time: 08/06/16  0805     History   Chief Complaint Chief Complaint  Patient presents with  . Abscess    HPI Janet Mcguire is a 55 y.o. female presenting with a tender break out of her left axilla which has been present for the past 3 days.  She denies fevers, chills, nausea or drainage from the sites and denies prior history of similar symptoms.  She tried squeezing a few of the sites but denies obtaining any drainage.  The history is provided by the patient.    Past Medical History:  Diagnosis Date  . Anxiety   . Depression   . Hypertension     Patient Active Problem List   Diagnosis Date Noted  . Elevated liver enzymes 01/11/2015  . Essential hypertension 01/11/2015  . Hypokalemia 01/11/2015  . Depression 01/11/2015  . History of cholecystectomy 01/11/2015    Past Surgical History:  Procedure Laterality Date  . CHOLECYSTECTOMY    . ERCP N/A 01/12/2015   Procedure: ENDOSCOPIC RETROGRADE CHOLANGIOPANCREATOGRAPHY (ERCP) ;  Surgeon: Malissa HippoNajeeb U Rehman, MD;  Location: [facility];  Service: Endoscopy;  Laterality: N/A;  Balloon Extraction  . SPHINCTEROTOMY N/A 01/12/2015   Procedure: SPHINCTEROTOMY;  Surgeon: Malissa HippoNajeeb U Rehman, MD;  Location: [facility];  Service: Endoscopy;  Laterality: N/A;  . TUBAL LIGATION      OB History    Gravida Para Term Preterm AB Living   3 1 1   2      SAB TAB Ectopic Multiple Live Births     2             Home Medications    Prior to Admission medications   Medication Sig Start Date End Date Taking? Authorizing Provider  cyclobenzaprine (FLEXERIL) 10 MG tablet Take 1 tablet (10 mg total) by mouth 3 (three) times daily. 07/04/16   Ivery QualeHobson Bryant, PA-C  diphenhydramine-acetaminophen (TYLENOL PM) 25-500 MG TABS tablet Take 2 tablets by mouth at bedtime as needed (pain).    Historical Provider, MD  hydrochlorothiazide (HYDRODIURIL) 12.5 MG tablet Take 1 tablet (12.5 mg total) by mouth  daily. 07/04/16   Ivery QualeHobson Bryant, PA-C  HYDROcodone-acetaminophen (NORCO/VICODIN) 5-325 MG tablet Take 1 tablet by mouth every 4 (four) hours as needed. 02/28/16   Burgess AmorJulie Maxmilian Trostel, PA-C  ondansetron (ZOFRAN ODT) 8 MG disintegrating tablet Take 1 tablet (8 mg total) by mouth every 8 (eight) hours as needed for nausea or vomiting. 02/28/16   Burgess AmorJulie Ladarien Beeks, PA-C  pantoprazole (PROTONIX) 20 MG tablet Take 1 tablet (20 mg total) by mouth daily. 02/28/16   Burgess AmorJulie Payge Eppes, PA-C  promethazine (PHENERGAN) 12.5 MG tablet Take 1 tablet (12.5 mg total) by mouth every 6 (six) hours as needed for nausea or vomiting. 08/17/15   Raeford RazorStephen Kohut, MD  sulfamethoxazole-trimethoprim (BACTRIM DS,SEPTRA DS) 800-160 MG tablet Take 1 tablet by mouth 2 (two) times daily. 08/06/16 08/13/16  Burgess AmorJulie Katharina Jehle, PA-C  triamcinolone cream (KENALOG) 0.1 % Apply 1 application topically 2 (two) times daily. Apply sparingly to sites of rash twice daily for up to 10 days. 01/11/16   Burgess AmorJulie Chrystian Ressler, PA-C    Family History Family History  Problem Relation Age of Onset  . Diabetes Mother     Social History Social History  Substance Use Topics  . Smoking status: Never Smoker  . Smokeless tobacco: Never Used  . Alcohol use No     Allergies   Ibuprofen   Review of Systems Review  of Systems  Constitutional: Negative for chills and fever.  Respiratory: Negative.   Cardiovascular: Negative.   Gastrointestinal: Negative.   Skin:       Negative except as mentioned in HPI.   Neurological: Negative for numbness.     Physical Exam Updated Vital Signs BP 135/73   Pulse 78   Temp 98.6 F (37 C)   Resp 18   Ht 6\' 1"  (1.854 m)   Wt 106.6 kg   LMP  (LMP Unknown) Comment: last  one years ago   SpO2 98%   BMI 31.00 kg/m   Physical Exam  Constitutional: She appears well-developed and well-nourished. No distress.  HENT:  Head: Normocephalic.  Neck: Neck supple.  Cardiovascular: Normal rate.   Pulmonary/Chest: Effort normal. She has no wheezes.   Musculoskeletal: Normal range of motion. She exhibits no edema.  Skin: Rash noted. Rash is papular.  approx 7 small papules left axilla, no drainage or fluctuance. No edema or surrounding erythema. Largest lesion 0.5 cm.     ED Treatments / Results  Labs (all labs ordered are listed, but only abnormal results are displayed) Labs Reviewed - No data to display  EKG  EKG Interpretation None       Radiology No results found.  Procedures Procedures (including critical care time)  Medications Ordered in ED Medications - No data to display   Initial Impression / Assessment and Plan / ED Course  I have reviewed the triage vital signs and the nursing notes.  Pertinent labs & imaging results that were available during my care of the patient were reviewed by me and considered in my medical decision making (see chart for details).  Clinical Course    Pt advised to avoid squeezing lesions. Warm compresses. Bactrim. F/u with pcp prn (has appt with her her pcp this Friday in [place]).  Final Clinical Impressions(s) / ED Diagnoses   Final diagnoses:  Folliculitis    New Prescriptions New Prescriptions   SULFAMETHOXAZOLE-TRIMETHOPRIM (BACTRIM DS,SEPTRA DS) 800-160 MG TABLET    Take 1 tablet by mouth 2 (two) times daily.     Burgess Amor, PA-C 08/06/16 6962    Azalia Bilis, MD 08/06/16 0830

## 2016-09-05 ENCOUNTER — Emergency Department (HOSPITAL_COMMUNITY)
Admission: EM | Admit: 2016-09-05 | Discharge: 2016-09-05 | Disposition: A | Discharge status: Home / Self Care | Payer: Medicaid Other | Attending: Emergency Medicine | Admitting: Emergency Medicine

## 2016-09-05 ENCOUNTER — Encounter (HOSPITAL_COMMUNITY): Payer: Self-pay | Admitting: Emergency Medicine

## 2016-09-05 DIAGNOSIS — Z79899 Other long term (current) drug therapy: Secondary | ICD-10-CM | POA: Diagnosis not present

## 2016-09-05 DIAGNOSIS — I10 Essential (primary) hypertension: Secondary | ICD-10-CM | POA: Insufficient documentation

## 2016-09-05 DIAGNOSIS — R21 Rash and other nonspecific skin eruption: Secondary | ICD-10-CM | POA: Diagnosis present

## 2016-09-05 DIAGNOSIS — L739 Follicular disorder, unspecified: Secondary | ICD-10-CM | POA: Diagnosis not present

## 2016-09-05 MED ORDER — CLINDAMYCIN PHOSPHATE 1 % EX GEL: Freq: Two times a day (BID) | CUTANEOUS | 0 refills | Status: AC

## 2016-09-05 NOTE — ED Provider Notes (Signed)
[facility] Provider Note   CSN: [id number] Arrival date & time: 09/05/16  1046  History   Chief Complaint Chief Complaint  Patient presents with  . Rash    HPI Janet Mcguire is a 55 y.o. female.  HPI  55 y.o. female  presents to the Emergency Department today complaining of rash to left arm. Notes itching and burning. Seen on 08-06-16 for same and Dx Folliculitis. Given Rx Bactrim with minimal relief. Notes no pain. Notes irritation. No redness. No purulence. No N/V. No fevers. Has not shaved over area. No new symptoms.   Past Medical History:  Diagnosis Date  . Anxiety   . Depression   . Hypertension     Patient Active Problem List   Diagnosis Date Noted  . Elevated liver enzymes 01/11/2015  . Essential hypertension 01/11/2015  . Hypokalemia 01/11/2015  . Depression 01/11/2015  . History of cholecystectomy 01/11/2015    Past Surgical History:  Procedure Laterality Date  . CHOLECYSTECTOMY    . ERCP N/A 01/12/2015   Procedure: ENDOSCOPIC RETROGRADE CHOLANGIOPANCREATOGRAPHY (ERCP) ;  Surgeon: Malissa HippoNajeeb U Rehman, MD;  Location: [facility];  Service: Endoscopy;  Laterality: N/A;  Balloon Extraction  . SPHINCTEROTOMY N/A 01/12/2015   Procedure: SPHINCTEROTOMY;  Surgeon: Malissa HippoNajeeb U Rehman, MD;  Location: [facility];  Service: Endoscopy;  Laterality: N/A;  . TUBAL LIGATION      OB History    Gravida Para Term Preterm AB Living   3 1 1   2      SAB TAB Ectopic Multiple Live Births     2             Home Medications    Prior to Admission medications   Medication Sig Start Date End Date Taking? Authorizing Provider  cyclobenzaprine (FLEXERIL) 10 MG tablet Take 1 tablet (10 mg total) by mouth 3 (three) times daily. 07/04/16   Ivery QualeHobson Bryant, PA-C  diphenhydramine-acetaminophen (TYLENOL PM) 25-500 MG TABS tablet Take 2 tablets by mouth at bedtime as needed (pain).    Historical Provider, MD  hydrochlorothiazide (HYDRODIURIL) 12.5 MG tablet Take 1 tablet (12.5 mg total) by mouth  daily. 07/04/16   Ivery QualeHobson Bryant, PA-C  HYDROcodone-acetaminophen (NORCO/VICODIN) 5-325 MG tablet Take 1 tablet by mouth every 4 (four) hours as needed. 02/28/16   Burgess AmorJulie Idol, PA-C  ondansetron (ZOFRAN ODT) 8 MG disintegrating tablet Take 1 tablet (8 mg total) by mouth every 8 (eight) hours as needed for nausea or vomiting. 02/28/16   Burgess AmorJulie Idol, PA-C  pantoprazole (PROTONIX) 20 MG tablet Take 1 tablet (20 mg total) by mouth daily. 02/28/16   Burgess AmorJulie Idol, PA-C  promethazine (PHENERGAN) 12.5 MG tablet Take 1 tablet (12.5 mg total) by mouth every 6 (six) hours as needed for nausea or vomiting. 08/17/15   Raeford RazorStephen Kohut, MD  triamcinolone cream (KENALOG) 0.1 % Apply 1 application topically 2 (two) times daily. Apply sparingly to sites of rash twice daily for up to 10 days. 01/11/16   Burgess AmorJulie Idol, PA-C    Family History Family History  Problem Relation Age of Onset  . Diabetes Mother     Social History Social History  Substance Use Topics  . Smoking status: Never Smoker  . Smokeless tobacco: Never Used  . Alcohol use No     Allergies   Ibuprofen   Review of Systems Review of Systems  Constitutional: Negative for fever.  Gastrointestinal: Negative for nausea and vomiting.  Skin: Positive for rash. Negative for wound.  Allergic/Immunologic: Negative for immunocompromised state.  Physical Exam Updated Vital Signs BP 148/64 (BP Location: Left Arm)   Pulse 99   Temp 97.9 F (36.6 C) (Oral)   Resp 18   Ht 6\' 1"  (1.854 m)   Wt 106.6 kg   LMP  (LMP Unknown) Comment: last  one years ago   SpO2 100%   BMI 31.00 kg/m   Physical Exam  Constitutional: She is oriented to person, place, and time. Vital signs are normal. She appears well-developed and well-nourished.  HENT:  Head: Normocephalic.  Right Ear: Hearing normal.  Left Ear: Hearing normal.  Eyes: Conjunctivae and EOM are normal. Pupils are equal, round, and reactive to light.  Cardiovascular: Normal rate and regular rhythm.     Pulmonary/Chest: Effort normal.  Neurological: She is alert and oriented to person, place, and time.  Skin: Skin is warm and dry.  Small <111mm raised follicles under left axilla. No erythema. No purulence. No signs of infection.   Psychiatric: She has a normal mood and affect. Her speech is normal and behavior is normal. Thought content normal.  Nursing note and vitals reviewed.  ED Treatments / Results  Labs (all labs ordered are listed, but only abnormal results are displayed) Labs Reviewed - No data to display  EKG  EKG Interpretation None       Radiology No results found.  Procedures Procedures (including critical care time)  Medications Ordered in ED Medications - No data to display   Initial Impression / Assessment and Plan / ED Course  I have reviewed the triage vital signs and the nursing notes.  Pertinent labs & imaging results that were available during my care of the patient were reviewed by me and considered in my medical decision making (see chart for details).  Clinical Course    Final Clinical Impressions(s) / ED Diagnoses  I have reviewed the relevant previous healthcare records. I obtained HPI from historian.  ED Course:  Assessment: Pt is a 55yF who presents with folliculitis under left axilla. Seen in past for same. On exam, pt in NAD. Nontoxic/nonseptic appearing. VSS. Afebrile lelft axilla with <741mm scattered folliculitis. No erythema. NO induration. No signs of infection. Topical Clindamycin given. Plan is to DC home with follow up to Dermatology. At time of discharge, Patient is in no acute distress. Vital Signs are stable. Patient is able to ambulate. Patient able to tolerate PO.   Disposition/Plan:  DC Home Additional Verbal discharge instructions given and discussed with patient.  Pt Instructed to f/u with Deramtology in the next week for evaluation and treatment of symptoms. Return precautions given Pt acknowledges and agrees with  plan  Supervising Physician Samuel JesterKathleen McManus, DO   Final diagnoses:  Folliculitis    New Prescriptions New Prescriptions   No medications on file     Audry Piliyler Delynn Pursley, PA-C 09/05/16 1133    Samuel JesterKathleen McManus, DO 09/07/16 2017

## 2016-09-05 NOTE — ED Notes (Signed)
Patient given discharge instruction, verbalized understand. Patient ambulatory out of the department.  

## 2016-09-05 NOTE — Discharge Instructions (Signed)
Please read and follow all provided instructions.  Your diagnoses today include:  1. Folliculitis     Tests performed today include: Vital signs. See below for your results today.   Medications prescribed:  Take as prescribed   Home care instructions:  Follow any educational materials contained in this packet.  Follow-up instructions: Please follow-up with Dermatology for further evaluation of symptoms and treatment   Return instructions:  Please return to the Emergency Department if you do not get better, if you get worse, or new symptoms OR  - Fever (temperature greater than 101.76F)  - Bleeding that does not stop with holding pressure to the area    -Severe pain (please note that you may be more sore the day after your accident)  - Chest Pain  - Difficulty breathing  - Severe nausea or vomiting  - Inability to tolerate food and liquids  - Passing out  - Skin becoming red around your wounds  - Change in mental status (confusion or lethargy)  - New numbness or weakness    Please return if you have any other emergent concerns.  Additional Information:  Your vital signs today were: BP 148/64 (BP Location: Left Arm)    Pulse 99    Temp 97.9 F (36.6 C) (Oral)    Resp 18    Ht 6\' 1"  (1.854 m)    Wt 106.6 kg    LMP  (LMP Unknown) Comment: last  one years ago    SpO2 100%    BMI 31.00 kg/m  If your blood pressure (BP) was elevated above 135/85 this visit, please have this repeated by your doctor within one month. ---------------

## 2016-09-05 NOTE — ED Triage Notes (Signed)
Pt states she has a rash under her left arm which is itching and burning.  Tx with Keflex from pcp for same a month ago.

## 2016-09-16 ENCOUNTER — Ambulatory Visit (INDEPENDENT_AMBULATORY_CARE_PROVIDER_SITE_OTHER): Payer: Medicaid Other | Admitting: Family Medicine

## 2016-09-16 ENCOUNTER — Encounter: Payer: Self-pay | Admitting: Family Medicine

## 2016-09-16 VITALS — BP 120/84 | HR 88 | Temp 98.7°F | Resp 18 | Ht 73.0 in | Wt 233.0 lb

## 2016-09-16 DIAGNOSIS — Z7689 Persons encountering health services in other specified circumstances: Secondary | ICD-10-CM

## 2016-09-16 DIAGNOSIS — Z550 Illiteracy and low-level literacy: Secondary | ICD-10-CM | POA: Insufficient documentation

## 2016-09-16 DIAGNOSIS — F79 Unspecified intellectual disabilities: Secondary | ICD-10-CM | POA: Insufficient documentation

## 2016-09-16 DIAGNOSIS — Z131 Encounter for screening for diabetes mellitus: Secondary | ICD-10-CM

## 2016-09-16 DIAGNOSIS — Z23 Encounter for immunization: Secondary | ICD-10-CM | POA: Diagnosis not present

## 2016-09-16 DIAGNOSIS — Z114 Encounter for screening for human immunodeficiency virus [HIV]: Secondary | ICD-10-CM

## 2016-09-16 DIAGNOSIS — I10 Essential (primary) hypertension: Secondary | ICD-10-CM | POA: Diagnosis not present

## 2016-09-16 DIAGNOSIS — F4323 Adjustment disorder with mixed anxiety and depressed mood: Secondary | ICD-10-CM

## 2016-09-16 DIAGNOSIS — Z5320 Procedure and treatment not carried out because of patient's decision for unspecified reasons: Secondary | ICD-10-CM | POA: Diagnosis not present

## 2016-09-16 DIAGNOSIS — Z1159 Encounter for screening for other viral diseases: Secondary | ICD-10-CM

## 2016-09-16 LAB — COMPREHENSIVE METABOLIC PANEL
ALK PHOS: 90 U/L (ref 33–130)
ALT: 19 U/L (ref 6–29)
AST: 17 U/L (ref 10–35)
Albumin: 4 g/dL (ref 3.6–5.1)
BUN: 9 mg/dL (ref 7–25)
CO2: 30 mmol/L (ref 20–31)
CREATININE: 0.75 mg/dL (ref 0.50–1.05)
Calcium: 9.3 mg/dL (ref 8.6–10.4)
Chloride: 105 mmol/L (ref 98–110)
GLUCOSE: 97 mg/dL (ref 65–99)
Potassium: 3.7 mmol/L (ref 3.5–5.3)
SODIUM: 142 mmol/L (ref 135–146)
TOTAL PROTEIN: 7.3 g/dL (ref 6.1–8.1)
Total Bilirubin: 0.3 mg/dL (ref 0.2–1.2)

## 2016-09-16 LAB — CBC
HCT: 36.3 % (ref 35.0–45.0)
Hemoglobin: 11.8 g/dL (ref 11.7–15.5)
MCH: 28.8 pg (ref 27.0–33.0)
MCHC: 32.5 g/dL (ref 32.0–36.0)
MCV: 88.5 fL (ref 80.0–100.0)
MPV: 10 fL (ref 7.5–12.5)
PLATELETS: 327 10*3/uL (ref 140–400)
RBC: 4.1 MIL/uL (ref 3.80–5.10)
RDW: 15 % (ref 11.0–15.0)
WBC: 6.4 10*3/uL (ref 3.8–10.8)

## 2016-09-16 LAB — LIPID PANEL
CHOL/HDL RATIO: 3.7 ratio (ref <5.0)
CHOLESTEROL: 208 mg/dL — AB (ref <200)
HDL: 56 mg/dL (ref >50)
LDL Cholesterol: 130 mg/dL — ABNORMAL HIGH (ref <100)
Triglycerides: 110 mg/dL (ref <150)
VLDL: 22 mg/dL (ref <30)

## 2016-09-16 LAB — HEMOGLOBIN A1C
HEMOGLOBIN A1C: 5.6 % (ref <5.7)
Mean Plasma Glucose: 114 mg/dL

## 2016-09-16 NOTE — Progress Notes (Signed)
Chief Complaint  Patient presents with  . Establish Care   Here to establish care Prior records requested On HCTZ for BP with good control On Citalopram for depression/anxiety with good result No acute complaint  Is on disability because is a "slow learner" and does not read well.  Refuses colonoscopy Refuses PAP Reason for above " I am too shy"  Mammogram up to date Sees EYE doctor regularly Sees dentist regularly  Shots up to date, will get flu today    Patient Active Problem List   Diagnosis Date Noted  . Literacy level of illiterate 09/16/2016  . Mental impairment 09/16/2016  . Colonoscopy refused 09/16/2016  . Elevated liver enzymes 01/11/2015  . Essential hypertension 01/11/2015  . Depression 01/11/2015  . History of cholecystectomy 01/11/2015    Outpatient Encounter Prescriptions as of 09/16/2016  Medication Sig  . citalopram (CELEXA) 20 MG tablet Take 20 mg by mouth daily.  . diphenhydramine-acetaminophen (TYLENOL PM) 25-500 MG TABS tablet Take 2 tablets by mouth at bedtime as needed (pain).  . hydrochlorothiazide (HYDRODIURIL) 12.5 MG tablet Take 1 tablet (12.5 mg total) by mouth daily.   No facility-administered encounter medications on file as of 09/16/2016.     Past Medical History:  Diagnosis Date  . Allergy   . Anxiety   . Depression   . Hypertension   . Substance abuse    "years ago"    Past Surgical History:  Procedure Laterality Date  . CHOLECYSTECTOMY    . ERCP N/A 01/12/2015   Procedure: ENDOSCOPIC RETROGRADE CHOLANGIOPANCREATOGRAPHY (ERCP) ;  Surgeon: Malissa HippoNajeeb U Rehman, MD;  Location: [facility];  Service: Endoscopy;  Laterality: N/A;  Balloon Extraction  . SPHINCTEROTOMY N/A 01/12/2015   Procedure: SPHINCTEROTOMY;  Surgeon: Malissa HippoNajeeb U Rehman, MD;  Location: [facility];  Service: Endoscopy;  Laterality: N/A;  . TUBAL LIGATION      Social History   Social History  . Marital status: Single    Spouse name: N/A  . Number of children: 1  .  Years of education: 8   Occupational History  . disabled     "slow learner"   Social History Main Topics  . Smoking status: Never Smoker  . Smokeless tobacco: Never Used  . Alcohol use No     Comment: prior  . Drug use:     Types: Marijuana     Comment: yesterday   . Sexual activity: Yes    Birth control/ protection: Surgical   Other Topics Concern  . Not on file   Social History Narrative   Lives alone   Manages own household   "slow learner"   Walks for exercise   Does not drive   Does not read well   Youngest of 13 children [place]    Family History  Problem Relation Age of Onset  . Diabetes Mother   . Cancer Brother     throat  . COPD Sister   . Kidney disease Sister   . Diabetes Sister   . Diabetes Sister   . Kidney disease Sister     Review of Systems  Constitutional: Negative for malaise/fatigue and weight loss.  HENT: Negative for congestion and hearing loss.   Eyes: Negative for blurred vision and double vision.  Respiratory: Negative for cough and shortness of breath.   Cardiovascular: Negative for chest pain, palpitations and leg swelling.  Gastrointestinal: Negative for constipation, diarrhea and heartburn.  Genitourinary: Negative for dysuria and frequency.  Musculoskeletal: Negative for  back pain, falls and joint pain.  Skin: Positive for rash.       Recent ER visit for rash - resolved  Neurological: Negative for dizziness, seizures and headaches.  Psychiatric/Behavioral: Negative for depression. The patient is not nervous/anxious and does not have insomnia.        Doing well on med    BP 120/84 (BP Location: Left Arm, Patient Position: Sitting, Cuff Size: Large)   Pulse 88   Temp 98.7 F (37.1 C) (Oral)   Resp 18   Ht 6\' 1"  (1.854 m)   Wt 233 lb 0.6 oz (105.7 kg)   LMP  (LMP Unknown) Comment: last  one years ago   SpO2 100%   BMI 30.75 kg/m   Physical Exam  Constitutional: She appears well-developed and  well-nourished.  HENT:  Head: Normocephalic.    Right Ear: External ear normal.  Left Ear: External ear normal.  Mouth/Throat: Oropharynx is clear and moist.  Eyes: Pupils are equal, round, and reactive to light.  Neck: Normal range of motion. No thyromegaly present.  Cardiovascular: Normal rate, regular rhythm and normal heart sounds.   Pulmonary/Chest: Effort normal and breath sounds normal. She has no wheezes.  Musculoskeletal: Normal range of motion. She exhibits no edema.  Lymphadenopathy:    She has no cervical adenopathy.  Neurological: She is alert.  Psychiatric: She has a normal mood and affect. Her behavior is normal. Thought content normal.  Pleasant, cooperative. Slow responses.  Poor fund of knowledge.    1. Encounter to establish care with new doctor   2. Mental impairment  3. Colonoscopy refused  4. Essential hypertension  5. Adjustment disorder with mixed anxiety and depressed mood  6. Need for hepatitis C screening test  7. Encounter for screening for HIV  8. Diabetes mellitus screening  9. Need for prophylactic vaccination and inoculation against influenza  - Flu Vaccine QUAD 36+ mos IM   Patient Instructions  Need old records Go next door for blood tests today  Continue hydrochlorothiazide for blood pressure Continue citalopram for depression  See me in a month   Eustace MooreYvonne Sue Zygmunt Mcglinn, MD

## 2016-09-16 NOTE — Patient Instructions (Addendum)
Need old records Go next door for blood tests today  Continue hydrochlorothiazide for blood pressure Continue citalopram for depression  See me in a month

## 2016-09-17 ENCOUNTER — Encounter: Payer: Self-pay | Admitting: Family Medicine

## 2016-09-17 DIAGNOSIS — E559 Vitamin D deficiency, unspecified: Secondary | ICD-10-CM | POA: Insufficient documentation

## 2016-09-17 LAB — VITAMIN D 25 HYDROXY (VIT D DEFICIENCY, FRACTURES): VIT D 25 HYDROXY: 15 ng/mL — AB (ref 30–100)

## 2016-09-17 LAB — HIV ANTIBODY (ROUTINE TESTING W REFLEX): HIV: NONREACTIVE

## 2016-09-17 LAB — HEPATITIS C ANTIBODY: HCV AB: NEGATIVE

## 2016-09-17 MED ORDER — VITAMIN D (ERGOCALCIFEROL) 1.25 MG (50000 UNIT) PO CAPS: 50000.0000 [IU] | ORAL_CAPSULE | ORAL | 1 refills | Status: DC

## 2016-09-24 ENCOUNTER — Telehealth: Payer: Self-pay | Admitting: Family Medicine

## 2016-09-24 NOTE — Telephone Encounter (Signed)
Janet Mcguire is asking what can she take for sinus and cough, please advise?

## 2016-09-24 NOTE — Telephone Encounter (Signed)
Forwarding to Dr. Lindaann SloughNelson's nurse

## 2016-09-25 NOTE — Telephone Encounter (Signed)
mucinex Dm.  Saline nasal wash.

## 2016-09-25 NOTE — Telephone Encounter (Signed)
Pt aware.

## 2016-10-24 ENCOUNTER — Ambulatory Visit: Payer: Medicaid Other | Admitting: Family Medicine

## 2016-10-29 ENCOUNTER — Encounter: Payer: Self-pay | Admitting: Family Medicine

## 2016-10-29 ENCOUNTER — Ambulatory Visit (INDEPENDENT_AMBULATORY_CARE_PROVIDER_SITE_OTHER): Payer: Medicaid Other | Admitting: Family Medicine

## 2016-10-29 ENCOUNTER — Other Ambulatory Visit: Payer: Self-pay | Admitting: Family Medicine

## 2016-10-29 VITALS — BP 118/70 | HR 75 | Temp 98.3°F | Resp 16 | Ht 73.0 in | Wt 237.0 lb

## 2016-10-29 DIAGNOSIS — E559 Vitamin D deficiency, unspecified: Secondary | ICD-10-CM

## 2016-10-29 DIAGNOSIS — I10 Essential (primary) hypertension: Secondary | ICD-10-CM

## 2016-10-29 DIAGNOSIS — Z1211 Encounter for screening for malignant neoplasm of colon: Secondary | ICD-10-CM

## 2016-10-29 NOTE — Progress Notes (Signed)
Chief Complaint  Patient presents with  . Follow-up    1 month   Patient is here for routine follow-up. She is feeling well. Her recent labs reviewed with her. She has vitamin D deficiency. Otherwise her laboratory tests were normal. She states that she is eating well. Her weight is stable. Depression is stable on medication. She is taking her vitamin D supplement. We discussed again today that she is overdue for Pap smear. She declines. We discussed again that she is overdue for colonoscopy. She declines. We did discuss cologuard and she states she will have this test, if covered by insurance. She understands that if her cologuard is positive she needs to have a colonoscopy.  Patient Active Problem List   Diagnosis Date Noted  . Vitamin D deficiency 09/17/2016  . Literacy level of illiterate 09/16/2016  . Mental impairment 09/16/2016  . Colonoscopy refused 09/16/2016  . Elevated liver enzymes 01/11/2015  . Essential hypertension 01/11/2015  . Depression 01/11/2015  . History of cholecystectomy 01/11/2015    Outpatient Encounter Prescriptions as of 10/29/2016  Medication Sig  . citalopram (CELEXA) 20 MG tablet Take 20 mg by mouth daily.  . diphenhydramine-acetaminophen (TYLENOL PM) 25-500 MG TABS tablet Take 2 tablets by mouth at bedtime as needed (pain).  . hydrochlorothiazide (HYDRODIURIL) 12.5 MG tablet Take 1 tablet (12.5 mg total) by mouth daily.  . Vitamin D, Ergocalciferol, (DRISDOL) 50000 units CAPS capsule Take 1 capsule (50,000 Units total) by mouth every 7 (seven) days.   No facility-administered encounter medications on file as of 10/29/2016.     No Known Allergies  Review of Systems  Constitutional: Negative for activity change, appetite change and fatigue.  HENT: Negative for congestion and dental problem.   Eyes: Negative for photophobia and visual disturbance.  Respiratory: Negative for cough and shortness of breath.   Cardiovascular: Negative for chest pain and  palpitations.  Gastrointestinal: Negative for blood in stool, constipation and diarrhea.  Genitourinary: Negative for difficulty urinating and menstrual problem.  Musculoskeletal: Negative for arthralgias and back pain.  Neurological: Negative for dizziness and headaches.  Psychiatric/Behavioral: Negative for dysphoric mood. The patient is not nervous/anxious.        Depression improving on medication     Results for Janet Mcguire, Janet Mcguire (MRN [id number]) as of 10/29/2016 12:21  Ref. Range 09/16/2016 10:44  Sodium Latest Ref Range: 135 - 146 mmol/L 142  Potassium Latest Ref Range: 3.5 - 5.3 mmol/L 3.7  Chloride Latest Ref Range: 98 - 110 mmol/L 105  CO2 Latest Ref Range: 20 - 31 mmol/L 30  Glucose Latest Ref Range: 65 - 99 mg/dL 97  Mean Plasma Glucose Latest Units: mg/dL 478  BUN Latest Ref Range: 7 - 25 mg/dL 9  Creatinine Latest Ref Range: 0.50 - 1.05 mg/dL 2.95  Calcium Latest Ref Range: 8.6 - 10.4 mg/dL 9.3  Alkaline Phosphatase Latest Ref Range: 33 - 130 U/L 90  Albumin Latest Ref Range: 3.6 - 5.1 g/dL 4.0  AST Latest Ref Range: 10 - 35 U/L 17  ALT Latest Ref Range: 6 - 29 U/L 19  Total Protein Latest Ref Range: 6.1 - 8.1 g/dL 7.3  Total Bilirubin Latest Ref Range: 0.2 - 1.2 mg/dL 0.3  Total CHOL/HDL Ratio Latest Ref Range: <5.0 Ratio 3.7  Cholesterol Latest Ref Range: <200 mg/dL 621 (H)  HDL Cholesterol Latest Ref Range: >50 mg/dL 56  LDL (calc) Latest Ref Range: <100 mg/dL 308 (H)  Triglycerides Latest Ref Range: <150 mg/dL 657  VLDL  Latest Ref Range: <30 mg/dL 22  Vitamin D, 04-[id number]-Hydroxy Latest Ref Range: 30 - 100 ng/mL 15 (L)  WBC Latest Ref Range: 3.8 - 10.8 K/uL 6.4  RBC Latest Ref Range: 3.80 - 5.10 MIL/uL 4.10  Hemoglobin Latest Ref Range: 11.7 - 15.5 g/dL 91.411.8  HCT Latest Ref Range: 35.0 - 45.0 % 36.3  MCV Latest Ref Range: 80.0 - 100.0 fL 88.5  MCH Latest Ref Range: 27.0 - 33.0 pg 28.8  MCHC Latest Ref Range: 32.0 - 36.0 g/dL 78.232.5  RDW Latest Ref Range: 11.0 - 15.0 % 15.0    Platelets Latest Ref Range: 140 - 400 K/uL 327  MPV Latest Ref Range: 7.5 - 12.5 fL 10.0  Hemoglobin A1C Latest Ref Range: <5.7 % 5.6  HCV Ab Latest Ref Range: NEGATIVE  NEGATIVE  HIV Latest Ref Range: NONREACTIVE  NONREACTIVE   BP 118/70 (BP Location: Right Arm, Patient Position: Sitting, Cuff Size: Large)   Pulse 75   Temp 98.3 F (36.8 C) (Oral)   Resp 16   Ht 6\' 1"  (1.854 m)   Wt 237 lb (107.5 kg)   LMP  (LMP Unknown) Comment: last  one years ago   SpO2 100%   BMI 31.27 kg/m   Physical Exam  Constitutional: She appears well-developed and well-nourished.  HENT:  Head: Normocephalic and atraumatic.  Mouth/Throat: Oropharynx is clear and moist.  Eyes: Conjunctivae are normal. Pupils are equal, round, and reactive to light.  Neck: Normal range of motion. Neck supple. No thyromegaly present.  Cardiovascular: Normal rate, regular rhythm and normal heart sounds.   Pulmonary/Chest: Effort normal and breath sounds normal. No respiratory distress.  Musculoskeletal: Normal range of motion. She exhibits no edema.  Lymphadenopathy:    She has no cervical adenopathy.  Neurological: She is alert.  Gait normal  Skin: Skin is warm and dry.  Psychiatric: She has a normal mood and affect. Her behavior is normal. Thought content normal.  Nursing note and vitals reviewed.   ASSESSMENT/PLAN:  1. Essential hypertension Controlled  2. Vitamin D deficiency On replacement   Patient Instructions  Continue taking same medicine  I will see about cologuard test for colon cancer  See me in six months  Call sooner for any problems   Eustace MooreYvonne Sue Antonia Culbertson, MD

## 2016-10-29 NOTE — Patient Instructions (Signed)
Continue taking same medicine  I will see about cologuard test for colon cancer  See me in six months  Call sooner for any problems

## 2016-10-30 ENCOUNTER — Other Ambulatory Visit: Payer: Self-pay | Admitting: Family Medicine

## 2016-10-30 DIAGNOSIS — Z1231 Encounter for screening mammogram for malignant neoplasm of breast: Secondary | ICD-10-CM

## 2016-11-25 ENCOUNTER — Ambulatory Visit (INDEPENDENT_AMBULATORY_CARE_PROVIDER_SITE_OTHER): Payer: Medicaid Other | Admitting: Family Medicine

## 2016-11-25 ENCOUNTER — Encounter: Payer: Self-pay | Admitting: Family Medicine

## 2016-11-25 VITALS — BP 138/76 | HR 80 | Temp 97.9°F | Resp 16 | Ht 73.0 in | Wt 241.0 lb

## 2016-11-25 DIAGNOSIS — M545 Low back pain: Secondary | ICD-10-CM

## 2016-11-25 DIAGNOSIS — M170 Bilateral primary osteoarthritis of knee: Secondary | ICD-10-CM | POA: Diagnosis not present

## 2016-11-25 DIAGNOSIS — M25561 Pain in right knee: Secondary | ICD-10-CM

## 2016-11-25 DIAGNOSIS — G8929 Other chronic pain: Secondary | ICD-10-CM

## 2016-11-25 DIAGNOSIS — B3789 Other sites of candidiasis: Secondary | ICD-10-CM | POA: Diagnosis not present

## 2016-11-25 DIAGNOSIS — M25562 Pain in left knee: Secondary | ICD-10-CM

## 2016-11-25 MED ORDER — MELOXICAM 15 MG PO TABS: 15.0000 mg | ORAL_TABLET | Freq: Every day | ORAL | 0 refills | Status: DC

## 2016-11-25 MED ORDER — NYSTATIN-TRIAMCINOLONE 100000-0.1 UNIT/GM-% EX OINT: 1.0000 "application " | TOPICAL_OINTMENT | Freq: Two times a day (BID) | CUTANEOUS | 0 refills | Status: DC

## 2016-11-25 NOTE — Patient Instructions (Signed)
Take the meloxicam once a day with food This is for arthritis pain Get the knee x rays See me in 2 weeks  Use the cream daily until rash clears Keep area dry with powder

## 2016-11-25 NOTE — Progress Notes (Signed)
Chief Complaint  Patient presents with  . Back Pain    x 1 week  . Vaginal Itching    ~ few days  Patient is here today for an acute visit. She has 3 complaints. First, she mentions low back pain. It comes and goes. It feels like a "spasm". No accident or injury. No overuse or lifting. No radiation of pain into hands or legs. Second, she mentions bilateral knee pain left greater than right. She states she has arthritis in her knees. This is been present for years.  It is getting worse over time. At times her right knee feels like it's going to buckle. She states they both " make noise ".  Her last x-rays were 3 or 4 years ago. Her last problem is a rash and itching in her groin. Been present for about a week. No vaginal itching or discharge. She describes it as being around her "panty line".   Patient Active Problem List   Diagnosis Date Noted  . Vitamin D deficiency 09/17/2016  . Literacy level of illiterate 09/16/2016  . Mental impairment 09/16/2016  . Colonoscopy refused 09/16/2016  . Elevated liver enzymes 01/11/2015  . Essential hypertension 01/11/2015  . Depression 01/11/2015  . History of cholecystectomy 01/11/2015    Outpatient Encounter Prescriptions as of 11/25/2016  Medication Sig  . acetaminophen (TYLENOL) 325 MG tablet Take 650 mg by mouth every 6 (six) hours as needed.  . citalopram (CELEXA) 20 MG tablet Take 20 mg by mouth daily.  . diphenhydramine-acetaminophen (TYLENOL PM) 25-500 MG TABS tablet Take 2 tablets by mouth at bedtime as needed (pain).  . hydrochlorothiazide (HYDRODIURIL) 12.5 MG tablet Take 1 tablet (12.5 mg total) by mouth daily.  . Vitamin D, Ergocalciferol, (DRISDOL) 50000 units CAPS capsule Take 1 capsule (50,000 Units total) by mouth every 7 (seven) days.  . meloxicam (MOBIC) 15 MG tablet Take 1 tablet (15 mg total) by mouth daily.  Marland Kitchen. nystatin-triamcinolone ointment (MYCOLOG) Apply 1 application topically 2 (two) times daily.   No  facility-administered encounter medications on file as of 11/25/2016.     No Known Allergies  Review of Systems  Constitutional: Negative for diaphoresis and fever.  HENT: Negative for congestion and dental problem.   Eyes: Negative.  Negative for visual disturbance.  Respiratory: Negative.  Negative for cough.   Cardiovascular: Negative.  Negative for chest pain and palpitations.  Gastrointestinal: Negative.  Negative for constipation and diarrhea.  Genitourinary: Negative for difficulty urinating and menstrual problem.  Musculoskeletal: Positive for arthralgias, back pain, gait problem and joint swelling.  Skin: Positive for rash.  Neurological: Negative for dizziness and headaches.  Psychiatric/Behavioral: Negative.  Negative for self-injury.    BP 138/76 (BP Location: Left Arm, Patient Position: Sitting, Cuff Size: Large)   Pulse 80   Temp 97.9 F (36.6 C) (Temporal)   Resp 16   Ht 6\' 1"  (1.854 m)   Wt 241 lb 0.6 oz (109.3 kg)   LMP  (LMP Unknown) Comment: last  one years ago   SpO2 100%   BMI 31.80 kg/m   Physical Exam  Constitutional: She is oriented to person, place, and time. She appears well-developed and well-nourished. No distress.  Pleasant. Normal gait.   HENT:  Head: Normocephalic and atraumatic.  Mouth/Throat: Oropharynx is clear and moist.  Eyes: Conjunctivae are normal. Pupils are equal, round, and reactive to light.  Neck: Normal range of motion. Neck supple.  Cardiovascular: Normal rate, regular rhythm and normal heart sounds.  Pulmonary/Chest: Effort normal and breath sounds normal.  Abdominal: Soft. Bowel sounds are normal. There is no tenderness.  Musculoskeletal:  Both knees with full range of motion. Crepitus. Warmth on the right. No effusion. No instability. Lumbar spine straight and symmetric. No muscular tenderness. Good mobility. No focal neurologic findings.  Neurological: She is alert and oriented to person, place, and time.  Skin: Rash noted.   Patches of erythema in groin  Psychiatric: She has a normal mood and affect. Her behavior is normal.    ASSESSMENT/PLAN:  1. Primary osteoarthritis of both knees  - DG Knee Complete 4 Views Right; Future - DG Knee Complete 4 Views Left; Future  2. Chronic pain of both knees  - DG Knee Complete 4 Views Right; Future - DG Knee Complete 4 Views Left; Future  3. Candida rash of groin   4. Chronic bilateral low back pain without sciatica    Patient Instructions  Take the meloxicam once a day with food This is for arthritis pain Get the knee x rays See me in 2 weeks  Use the cream daily until rash clears Keep area dry with powder   Eustace Moore, MD

## 2016-11-29 ENCOUNTER — Ambulatory Visit (HOSPITAL_COMMUNITY)
Admission: RE | Admit: 2016-11-29 | Discharge: 2016-11-29 | Disposition: A | Discharge status: Home / Self Care | Payer: Medicaid Other | Source: Ambulatory Visit | Attending: Family Medicine | Admitting: Family Medicine

## 2016-11-29 DIAGNOSIS — Z1231 Encounter for screening mammogram for malignant neoplasm of breast: Secondary | ICD-10-CM

## 2016-11-29 DIAGNOSIS — G8929 Other chronic pain: Secondary | ICD-10-CM | POA: Diagnosis not present

## 2016-11-29 DIAGNOSIS — M25561 Pain in right knee: Secondary | ICD-10-CM | POA: Insufficient documentation

## 2016-11-29 DIAGNOSIS — M170 Bilateral primary osteoarthritis of knee: Secondary | ICD-10-CM | POA: Insufficient documentation

## 2016-11-29 DIAGNOSIS — M25562 Pain in left knee: Secondary | ICD-10-CM | POA: Insufficient documentation

## 2016-12-09 ENCOUNTER — Ambulatory Visit: Payer: Self-pay | Admitting: Family Medicine

## 2016-12-13 ENCOUNTER — Ambulatory Visit (INDEPENDENT_AMBULATORY_CARE_PROVIDER_SITE_OTHER): Payer: Medicaid Other | Admitting: Family Medicine

## 2016-12-13 ENCOUNTER — Encounter: Payer: Self-pay | Admitting: Family Medicine

## 2016-12-13 VITALS — BP 128/74 | HR 76 | Temp 98.9°F | Resp 18 | Ht 73.0 in | Wt 241.0 lb

## 2016-12-13 DIAGNOSIS — M170 Bilateral primary osteoarthritis of knee: Secondary | ICD-10-CM | POA: Diagnosis not present

## 2016-12-13 DIAGNOSIS — M25561 Pain in right knee: Secondary | ICD-10-CM

## 2016-12-13 DIAGNOSIS — M25562 Pain in left knee: Secondary | ICD-10-CM | POA: Diagnosis not present

## 2016-12-13 DIAGNOSIS — G8929 Other chronic pain: Secondary | ICD-10-CM | POA: Diagnosis not present

## 2016-12-13 MED ORDER — METHYLPREDNISOLONE ACETATE 80 MG/ML IJ SUSP
80.0000 mg | Freq: Once | INTRAMUSCULAR | Status: AC
  Administered 2016-12-13: 80 mg via INTRA_ARTICULAR

## 2016-12-13 NOTE — Progress Notes (Signed)
Patient was seen previously for knee pain. I advised her to get some x-rays and come back for follow-up. She has chronic knee pain, bilateral, right greater than left. Her x-rays to document moderate osteoarthritis with some medial joint space narrowing and bone spurs. I tried her on a prescription of Mobic 15 mg a day. This doesn't help. Her right knee is still very painful. She is here today for an injection. Time out Consent Appropriate site lateral ( right       )  knee prepped and marked Area infiltrated with a 1 cc 1% lidocaine wheal Knee joint injected with 3 cc of 1% lidocaine and 80 mg of DepoMedrol Patient tolerated procedure well Post injection instructions reviewed  Primary osteoarthritis of both knees - Plan: methylPREDNISolone acetate (DEPO-MEDROL) injection 80 mg, Arthrocentesis  Chronic pain of both knees - Plan: methylPREDNISolone acetate (DEPO-MEDROL) injection 80 mg, Arthrocentesis  Patient Instructions  Put ice on knee for 20 min when you get home Put ice on again later today Take acetaminophen if needed for pain Rest today, no more walking than you need to Call if not better in  Few days

## 2016-12-13 NOTE — Patient Instructions (Signed)
Put ice on knee for 20 min when you get home Put ice on again later today Take acetaminophen if needed for pain Rest today, no more walking than you need to Call if not better in  Few days

## 2017-01-10 ENCOUNTER — Other Ambulatory Visit: Payer: Self-pay | Admitting: Family Medicine

## 2017-01-13 ENCOUNTER — Other Ambulatory Visit: Payer: Self-pay

## 2017-01-13 NOTE — Telephone Encounter (Signed)
Seen 2 23 18 

## 2017-01-20 ENCOUNTER — Other Ambulatory Visit: Payer: Self-pay

## 2017-01-20 MED ORDER — CITALOPRAM HYDROBROMIDE 20 MG PO TABS: 20.0000 mg | ORAL_TABLET | Freq: Every day | ORAL | 3 refills | Status: DC

## 2017-01-20 MED ORDER — HYDROCHLOROTHIAZIDE 12.5 MG PO TABS: 12.5000 mg | ORAL_TABLET | Freq: Every day | ORAL | 3 refills | Status: DC

## 2017-01-20 NOTE — Telephone Encounter (Signed)
Seen 2 23 18 

## 2017-01-29 ENCOUNTER — Emergency Department (HOSPITAL_COMMUNITY)
Admission: EM | Admit: 2017-01-29 | Discharge: 2017-01-29 | Disposition: A | Discharge status: Home / Self Care | Payer: Medicaid Other | Attending: Emergency Medicine | Admitting: Emergency Medicine

## 2017-01-29 ENCOUNTER — Encounter (HOSPITAL_COMMUNITY): Payer: Self-pay | Admitting: Cardiology

## 2017-01-29 DIAGNOSIS — R112 Nausea with vomiting, unspecified: Secondary | ICD-10-CM | POA: Insufficient documentation

## 2017-01-29 DIAGNOSIS — Z79899 Other long term (current) drug therapy: Secondary | ICD-10-CM | POA: Diagnosis not present

## 2017-01-29 DIAGNOSIS — R1010 Upper abdominal pain, unspecified: Secondary | ICD-10-CM

## 2017-01-29 DIAGNOSIS — R1013 Epigastric pain: Secondary | ICD-10-CM | POA: Insufficient documentation

## 2017-01-29 DIAGNOSIS — R6883 Chills (without fever): Secondary | ICD-10-CM | POA: Diagnosis not present

## 2017-01-29 DIAGNOSIS — I10 Essential (primary) hypertension: Secondary | ICD-10-CM | POA: Insufficient documentation

## 2017-01-29 LAB — URINALYSIS, ROUTINE W REFLEX MICROSCOPIC
BILIRUBIN URINE: NEGATIVE
Glucose, UA: NEGATIVE mg/dL
Ketones, ur: 5 mg/dL — AB
LEUKOCYTES UA: NEGATIVE
Nitrite: POSITIVE — AB
Protein, ur: >=300 mg/dL — AB
Specific Gravity, Urine: 1.018 (ref 1.005–1.030)
pH: 5 (ref 5.0–8.0)

## 2017-01-29 LAB — COMPREHENSIVE METABOLIC PANEL
ALT: 50 U/L (ref 14–54)
AST: 42 U/L — AB (ref 15–41)
Albumin: 3.8 g/dL (ref 3.5–5.0)
Alkaline Phosphatase: 92 U/L (ref 38–126)
Anion gap: 10 (ref 5–15)
BUN: 12 mg/dL (ref 6–20)
CHLORIDE: 102 mmol/L (ref 101–111)
CO2: 26 mmol/L (ref 22–32)
Calcium: 9.3 mg/dL (ref 8.9–10.3)
Creatinine, Ser: 0.91 mg/dL (ref 0.44–1.00)
Glucose, Bld: 116 mg/dL — ABNORMAL HIGH (ref 65–99)
POTASSIUM: 3.3 mmol/L — AB (ref 3.5–5.1)
Sodium: 138 mmol/L (ref 135–145)
Total Bilirubin: 0.7 mg/dL (ref 0.3–1.2)
Total Protein: 8.4 g/dL — ABNORMAL HIGH (ref 6.5–8.1)

## 2017-01-29 LAB — CBC
HEMATOCRIT: 38.2 % (ref 36.0–46.0)
HEMOGLOBIN: 12.8 g/dL (ref 12.0–15.0)
MCH: 29.6 pg (ref 26.0–34.0)
MCHC: 33.5 g/dL (ref 30.0–36.0)
MCV: 88.4 fL (ref 78.0–100.0)
Platelets: 334 10*3/uL (ref 150–400)
RBC: 4.32 MIL/uL (ref 3.87–5.11)
RDW: 15 % (ref 11.5–15.5)
WBC: 10.2 10*3/uL (ref 4.0–10.5)

## 2017-01-29 LAB — LIPASE, BLOOD: LIPASE: 20 U/L (ref 11–51)

## 2017-01-29 MED ORDER — PANTOPRAZOLE SODIUM 20 MG PO TBEC: 20.0000 mg | DELAYED_RELEASE_TABLET | Freq: Every day | ORAL | 0 refills | Status: DC

## 2017-01-29 MED ORDER — ONDANSETRON HCL 4 MG/2ML IJ SOLN
4.0000 mg | Freq: Once | INTRAMUSCULAR | Status: AC
  Administered 2017-01-29: 4 mg via INTRAVENOUS
  Filled 2017-01-29: qty 2

## 2017-01-29 MED ORDER — ONDANSETRON 4 MG PO TBDP: ORAL_TABLET | ORAL | 0 refills | Status: DC

## 2017-01-29 MED ORDER — PANTOPRAZOLE SODIUM 40 MG IV SOLR
40.0000 mg | Freq: Once | INTRAVENOUS | Status: AC
  Administered 2017-01-29: 40 mg via INTRAVENOUS
  Filled 2017-01-29: qty 40

## 2017-01-29 NOTE — Discharge Instructions (Signed)
Drink plenty of fluids and follow up with your md next week °

## 2017-01-29 NOTE — ED Triage Notes (Signed)
LUQ abdominal pain and vomiting since last night.

## 2017-01-29 NOTE — ED Provider Notes (Signed)
[facility]-EMERG[facility]Y DEPT Provider Note   CSN: [id number] Arrival date & time: 01/29/17  4098   By signing my name below, I, Clovis Pu, attest that this documentation has been prepared under the direction and in the presence of [name], MD  Electronically Signed: Clovis Pu, ED Scribe. 01/29/17. 8:55 AM.   History   Chief Complaint Chief Complaint  Patient presents with  . Abdominal Pain    HPI Comments:  Janet Mcguire is a 56 y.o. female, with a PSHx of cholecystectomy, who presents to the Emergency Department complaining of acute onset, moderate abdominal pain x yesterday. Pt reports associated nausea, vomiting and chills. Her pain is worse with palpation. Pt denies any other associated symptoms. No other complaints noted at this time.  The history is provided by the patient. No language interpreter was used.  Abdominal Pain   This is a new problem. The current episode started yesterday. The problem has not changed since onset.The pain is associated with an unknown factor. The pain is located in the epigastric region. The pain is moderate. Associated symptoms include nausea and vomiting. Pertinent negatives include diarrhea, frequency, hematuria and headaches. The symptoms are aggravated by palpation. Nothing relieves the symptoms.    Past Medical History:  Diagnosis Date  . Allergy   . Anxiety   . Depression   . Hypertension   . Substance abuse    "years ago"    Patient Active Problem List   Diagnosis Date Noted  . Vitamin D deficiency 09/17/2016  . Literacy level of illiterate 09/16/2016  . Mental impairment 09/16/2016  . Colonoscopy refused 09/16/2016  . Elevated liver enzymes 01/11/2015  . Essential hypertension 01/11/2015  . Depression 01/11/2015  . History of cholecystectomy 01/11/2015    Past Surgical History:  Procedure Laterality Date  . CHOLECYSTECTOMY    . ERCP N/A 01/12/2015   Procedure: ENDOSCOPIC RETROGRADE CHOLANGIOPANCREATOGRAPHY (ERCP) ;   Surgeon: Malissa Hippo, MD;  Location: [facility];  Service: Endoscopy;  Laterality: N/A;  Balloon Extraction  . SPHINCTEROTOMY N/A 01/12/2015   Procedure: SPHINCTEROTOMY;  Surgeon: Malissa Hippo, MD;  Location: [facility];  Service: Endoscopy;  Laterality: N/A;  . TUBAL LIGATION      OB History    Gravida Para Term Preterm AB Living   SAB TAB Ectopic Multiple Live Births     2             Home Medications    Prior to Admission medications   Medication Sig Start Date End Date Taking? Authorizing Provider  citalopram (CELEXA) 20 MG tablet Take 20 mg by mouth daily.   Yes Historical Provider, MD  hydrochlorothiazide (HYDRODIURIL) 12.5 MG tablet Take 1 tablet (12.5 mg total) by mouth daily. 01/20/17  Yes Kerri Perches, MD  Vitamin D, Ergocalciferol, (DRISDOL) 50000 units CAPS capsule Take 1 capsule (50,000 Units total) by mouth every 7 (seven) days. 09/17/16  Yes Eustace Moore, MD  citalopram (CELEXA) 20 MG tablet Take 1 tablet (20 mg total) by mouth daily. 01/20/17   Eustace Moore, MD  meloxicam (MOBIC) 15 MG tablet TAKE 1 TABLET BY MOUTH ONCE A DAY. Patient not taking: Reported on 01/29/2017 01/13/17   Eustace Moore, MD  nystatin-triamcinolone ointment [facility]) Apply 1 application topically 2 (two) times daily. Patient not taking: Reported on 01/29/2017 11/25/16   Eustace Moore, MD    Family History Family History  Problem Relation Age of Onset  .  Diabetes Mother   . Cancer Brother     throat  . COPD Sister   . Kidney disease Sister   . Diabetes Sister   . Diabetes Sister   . Kidney disease Sister     Social History Social History  Substance Use Topics  . Smoking status: Never Smoker  . Smokeless tobacco: Never Used  . Alcohol use Yes     Comment: occasional beer      Allergies   Patient has no known allergies.   Review of Systems Review of Systems  Constitutional: Positive for chills. Negative for appetite change and fatigue.  HENT:  Negative for congestion, ear discharge and sinus pressure.   Eyes: Negative for discharge.  Respiratory: Negative for cough.   Cardiovascular: Negative for chest pain.  Gastrointestinal: Positive for abdominal pain, nausea and vomiting. Negative for diarrhea.  Genitourinary: Negative for frequency and hematuria.  Musculoskeletal: Negative for back pain.  Skin: Negative for rash.  Neurological: Negative for seizures and headaches.  Psychiatric/Behavioral: Negative for hallucinations.     Physical Exam Updated Vital Signs BP (!) 137/93   Pulse (!) 128   Temp 99.9 F (37.7 C) (Oral)   Resp 18   Ht  (1.854 m)   Wt 241 lb (109.3 kg)   LMP  (LMP Unknown) Comment: last  one years ago   SpO2 98%   BMI 31.80 kg/m   Physical Exam  Constitutional: She is oriented to person, place, and time. She appears well-developed.  HENT:  Head: Normocephalic.  Eyes: Conjunctivae and EOM are normal. No scleral icterus.  Neck: Neck supple. No thyromegaly present.  Cardiovascular: Normal rate and regular rhythm.  Exam reveals no gallop and no friction rub.   No murmur heard. Pulmonary/Chest: No stridor. She has no wheezes. She has no rales. She exhibits no tenderness.  Abdominal: She exhibits no distension. There is tenderness in the epigastric area. There is no rebound.  Minimal epigastric tenderness.   Musculoskeletal: Normal range of motion. She exhibits no edema.  Lymphadenopathy:    She has no cervical adenopathy.  Neurological: She is oriented to person, place, and time. She exhibits normal muscle tone. Coordination normal.  Skin: No rash noted. No erythema.  Psychiatric: She has a normal mood and affect. Her behavior is normal.  Nursing note and vitals reviewed.    ED Treatments / Results  DIAGNOSTIC STUDIES:  Oxygen Saturation is 98% on RA, normal by my interpretation.    COORDINATION OF CARE:  8:53 AM Discussed treatment plan with pt at bedside and pt agreed to  plan.  Labs (all labs ordered are listed, but only abnormal results are displayed) Labs Reviewed  LIPASE, BLOOD  COMPREHENSIVE METABOLIC PANEL  CBC  URINALYSIS, ROUTINE W REFLEX MICROSCOPIC    EKG  EKG Interpretation None       Radiology No results found.  Procedures Procedures (including critical care time)  Medications Ordered in ED Medications - No data to display   Initial Impression / Assessment and Plan / ED Course  I have reviewed the triage vital signs and the nursing notes.  Pertinent labs & imaging results that were available during my care of the patient were reviewed by me and considered in my medical decision making (see chart for details).     Patient with epigastric discomfort. Labs unremarkable. Patient improved with protonic Zofran and she will be sent home with same medicines. And follow-up with her PCP  Final Clinical Impressions(s) / ED Diagnoses  Final diagnoses:  None    New Prescriptions New Prescriptions   No medications on file  The chart was scribed for me under my direct supervision.  I personally performed the history, physical, and medical decision making and all procedures in the evaluation of this patient.[name], MD 01/29/17 [phone]

## 2017-01-29 NOTE — ED Notes (Signed)
Urine sample provided but insufficient amount noted to specimen cup. Pt aware need another urine sample.

## 2017-01-31 ENCOUNTER — Encounter (HOSPITAL_COMMUNITY): Payer: Self-pay | Admitting: Emergency Medicine

## 2017-01-31 ENCOUNTER — Emergency Department (HOSPITAL_COMMUNITY)
Admission: EM | Admit: 2017-01-31 | Discharge: 2017-01-31 | Disposition: A | Discharge status: Home / Self Care | Payer: Medicaid Other | Attending: Emergency Medicine | Admitting: Emergency Medicine

## 2017-01-31 ENCOUNTER — Emergency Department (HOSPITAL_COMMUNITY): Payer: Medicaid Other

## 2017-01-31 DIAGNOSIS — R112 Nausea with vomiting, unspecified: Secondary | ICD-10-CM | POA: Diagnosis present

## 2017-01-31 DIAGNOSIS — I10 Essential (primary) hypertension: Secondary | ICD-10-CM | POA: Diagnosis not present

## 2017-01-31 DIAGNOSIS — E876 Hypokalemia: Secondary | ICD-10-CM

## 2017-01-31 DIAGNOSIS — K5900 Constipation, unspecified: Secondary | ICD-10-CM | POA: Insufficient documentation

## 2017-01-31 DIAGNOSIS — N3000 Acute cystitis without hematuria: Secondary | ICD-10-CM | POA: Insufficient documentation

## 2017-01-31 DIAGNOSIS — Z79899 Other long term (current) drug therapy: Secondary | ICD-10-CM | POA: Diagnosis not present

## 2017-01-31 DIAGNOSIS — R197 Diarrhea, unspecified: Secondary | ICD-10-CM

## 2017-01-31 LAB — URINALYSIS, ROUTINE W REFLEX MICROSCOPIC
BILIRUBIN URINE: NEGATIVE
Glucose, UA: NEGATIVE mg/dL
KETONES UR: NEGATIVE mg/dL
NITRITE: POSITIVE — AB
PH: 5 (ref 5.0–8.0)
Protein, ur: 100 mg/dL — AB
Specific Gravity, Urine: 1.012 (ref 1.005–1.030)

## 2017-01-31 LAB — CBC WITH DIFFERENTIAL/PLATELET
BASOS ABS: 0.1 10*3/uL (ref 0.0–0.1)
Basophils Relative: 1 %
EOS PCT: 1 %
Eosinophils Absolute: 0.1 10*3/uL (ref 0.0–0.7)
HEMATOCRIT: 35.5 % — AB (ref 36.0–46.0)
Hemoglobin: 11.7 g/dL — ABNORMAL LOW (ref 12.0–15.0)
LYMPHS ABS: 2 10*3/uL (ref 0.7–4.0)
LYMPHS PCT: 21 %
MCH: 28.9 pg (ref 26.0–34.0)
MCHC: 33 g/dL (ref 30.0–36.0)
MCV: 87.7 fL (ref 78.0–100.0)
MONO ABS: 1.1 10*3/uL — AB (ref 0.1–1.0)
MONOS PCT: 11 %
NEUTROS ABS: 6.6 10*3/uL (ref 1.7–7.7)
Neutrophils Relative %: 66 %
PLATELETS: 364 10*3/uL (ref 150–400)
RBC: 4.05 MIL/uL (ref 3.87–5.11)
RDW: 14.6 % (ref 11.5–15.5)
WBC: 9.8 10*3/uL (ref 4.0–10.5)

## 2017-01-31 LAB — COMPREHENSIVE METABOLIC PANEL
ALT: 55 U/L — ABNORMAL HIGH (ref 14–54)
ANION GAP: 10 (ref 5–15)
AST: 39 U/L (ref 15–41)
Albumin: 3.7 g/dL (ref 3.5–5.0)
Alkaline Phosphatase: 93 U/L (ref 38–126)
BILIRUBIN TOTAL: 0.7 mg/dL (ref 0.3–1.2)
BUN: 9 mg/dL (ref 6–20)
CHLORIDE: 100 mmol/L — AB (ref 101–111)
CO2: 27 mmol/L (ref 22–32)
Calcium: 9.3 mg/dL (ref 8.9–10.3)
Creatinine, Ser: 0.95 mg/dL (ref 0.44–1.00)
Glucose, Bld: 135 mg/dL — ABNORMAL HIGH (ref 65–99)
POTASSIUM: 2.9 mmol/L — AB (ref 3.5–5.1)
Sodium: 137 mmol/L (ref 135–145)
TOTAL PROTEIN: 8.2 g/dL — AB (ref 6.5–8.1)

## 2017-01-31 LAB — TROPONIN I

## 2017-01-31 LAB — RAPID URINE DRUG SCREEN, HOSP PERFORMED
Amphetamines: NOT DETECTED
BARBITURATES: NOT DETECTED
Benzodiazepines: NOT DETECTED
Cocaine: NOT DETECTED
Opiates: NOT DETECTED
Tetrahydrocannabinol: POSITIVE — AB

## 2017-01-31 LAB — LIPASE, BLOOD: LIPASE: 20 U/L (ref 11–51)

## 2017-01-31 LAB — PREGNANCY, URINE: Preg Test, Ur: NEGATIVE

## 2017-01-31 LAB — ETHANOL

## 2017-01-31 MED ORDER — POTASSIUM CHLORIDE CRYS ER 20 MEQ PO TBCR
40.0000 meq | EXTENDED_RELEASE_TABLET | Freq: Once | ORAL | Status: AC
  Administered 2017-01-31: 40 meq via ORAL
  Filled 2017-01-31: qty 2

## 2017-01-31 MED ORDER — CEPHALEXIN 500 MG PO CAPS: 500.0000 mg | ORAL_CAPSULE | Freq: Once | ORAL | Status: DC

## 2017-01-31 MED ORDER — CEPHALEXIN 500 MG PO CAPS
1000.0000 mg | ORAL_CAPSULE | Freq: Once | ORAL | Status: AC
  Administered 2017-01-31: 1000 mg via ORAL
  Filled 2017-01-31: qty 2

## 2017-01-31 MED ORDER — PHENAZOPYRIDINE HCL 100 MG PO TABS
200.0000 mg | ORAL_TABLET | Freq: Once | ORAL | Status: AC
  Administered 2017-01-31: 200 mg via ORAL
  Filled 2017-01-31: qty 2

## 2017-01-31 MED ORDER — PHENAZOPYRIDINE HCL 200 MG PO TABS: 200.0000 mg | ORAL_TABLET | Freq: Three times a day (TID) | ORAL | 0 refills | Status: DC

## 2017-01-31 MED ORDER — GI COCKTAIL ~~LOC~~
30.0000 mL | Freq: Once | ORAL | Status: AC
  Administered 2017-01-31: 30 mL via ORAL
  Filled 2017-01-31: qty 30

## 2017-01-31 MED ORDER — CEPHALEXIN 500 MG PO CAPS: 1000.0000 mg | ORAL_CAPSULE | Freq: Two times a day (BID) | ORAL | 0 refills | Status: DC

## 2017-01-31 MED ORDER — ONDANSETRON HCL 4 MG/2ML IJ SOLN
4.0000 mg | Freq: Once | INTRAMUSCULAR | Status: AC
Start: 2017-01-31 — End: 2017-01-31
  Administered 2017-01-31: 4 mg via INTRAVENOUS
  Filled 2017-01-31: qty 2

## 2017-01-31 MED ORDER — FAMOTIDINE 20 MG PO TABS
40.0000 mg | ORAL_TABLET | Freq: Once | ORAL | Status: AC
  Administered 2017-01-31: 40 mg via ORAL
  Filled 2017-01-31: qty 2

## 2017-01-31 MED ORDER — POTASSIUM CHLORIDE CRYS ER 20 MEQ PO TBCR: 20.0000 meq | EXTENDED_RELEASE_TABLET | Freq: Two times a day (BID) | ORAL | 0 refills | Status: DC

## 2017-01-31 MED ORDER — DOCUSATE SODIUM 100 MG PO CAPS: 100.0000 mg | ORAL_CAPSULE | Freq: Two times a day (BID) | ORAL | 0 refills | Status: DC | PRN

## 2017-01-31 MED ORDER — SODIUM CHLORIDE 0.9 % IV BOLUS (SEPSIS)
1000.0000 mL | Freq: Once | INTRAVENOUS | Status: AC
  Administered 2017-01-31: 1000 mL via INTRAVENOUS

## 2017-01-31 NOTE — ED Triage Notes (Signed)
Pt reports with abdominal pain that she describes as numbness that shoots from abdomen to back, was last treated here 4 days ago, no acute distress noted at this time

## 2017-01-31 NOTE — Discharge Instructions (Signed)
Take your next dose of the antibiotic and the pyridium tonight before bed.  Make sure you are drinking plenty of fluids.  The pyridium will help with your pain but will turn your urine bright orange.  This is normal. Take the potassium as prescribed for the next week which should help your low potassium level.

## 2017-01-31 NOTE — ED Provider Notes (Signed)
[facility] Provider Note   CSN: [id number] Arrival date & time: 01/31/17  0753     History   Chief Complaint Chief Complaint  Patient presents with  . Abdominal Pain    chronic    HPI Janet Mcguire is a 56 y.o. female with a history as outlined below including chronic intermittent pain which has been present for her for at least the past week.  She was seen here 2 days ago for similar symptoms which was treated with Protonix and Zofran which per her chart was helping at her last discharge, which she states is no longer helping her pain.  She reports constant generalized pain although 2 days ago it was localized to epigastric area.  She is also had nausea without emesis.  She denies diarrhea, fevers or chills and has had no dysuria back or flank pain.  Her last bowel movement was yesterday which she reports as normal.  The BM did not improve her symptoms.  She has found no alleviating for her symptoms since her last visit here but states we gave her a medicine through her IV which started with the letter "D" that made her abdomen feel "hot" then the pain went away.   She does endorse a 30 minute episode yesterday evening of midsternal sharp chest pain associated with shortness of breath which has since resolved.   The history is provided by the patient.    Past Medical History:  Diagnosis Date  . Allergy   . Anxiety   . Depression   . Hypertension   . Substance abuse    "years ago"    Patient Active Problem List   Diagnosis Date Noted  . Vitamin D deficiency 09/17/2016  . Literacy level of illiterate 09/16/2016  . Mental impairment 09/16/2016  . Colonoscopy refused 09/16/2016  . Elevated liver enzymes 01/11/2015  . Essential hypertension 01/11/2015  . Depression 01/11/2015  . History of cholecystectomy 01/11/2015    Past Surgical History:  Procedure Laterality Date  . CHOLECYSTECTOMY    . ERCP N/A 01/12/2015   Procedure: ENDOSCOPIC RETROGRADE  CHOLANGIOPANCREATOGRAPHY (ERCP) ;  Surgeon: Malissa Hippo, MD;  Location: [facility];  Service: Endoscopy;  Laterality: N/A;  Balloon Extraction  . SPHINCTEROTOMY N/A 01/12/2015   Procedure: SPHINCTEROTOMY;  Surgeon: Malissa Hippo, MD;  Location: [facility];  Service: Endoscopy;  Laterality: N/A;  . TUBAL LIGATION      OB History    Gravida Para Term Preterm AB Living   SAB TAB Ectopic Multiple Live Births     2             Home Medications    Prior to Admission medications   Medication Sig Start Date End Date Taking? Authorizing Provider  citalopram (CELEXA) 20 MG tablet Take 1 tablet (20 mg total) by mouth daily. 01/20/17  Yes Eustace Moore, MD  hydrochlorothiazide (HYDRODIURIL) 12.5 MG tablet Take 1 tablet (12.5 mg total) by mouth daily. 01/20/17  Yes Kerri Perches, MD  ondansetron (ZOFRAN ODT) 4 MG disintegrating tablet  ODT q4 hours prn nausea/vomit 01/29/17  Yes [name], MD  pantoprazole (PROTONIX) 20 MG tablet Take 1 tablet (20 mg total) by mouth daily. 01/29/17  Yes [name], MD  Vitamin D, Ergocalciferol, (DRISDOL) 50000 units CAPS capsule Take 1 capsule (50,000 Units total) by mouth every 7 (seven) days. 09/17/16  Yes Eustace Moore, MD  cephALEXin (KEFLEX) 500 MG capsule  Take 2 capsules (1,000 mg total) by mouth 2 (two) times daily. 01/31/17   Burgess Amor, PA-C  docusate sodium (COLACE) 100 MG capsule Take 1 capsule (100 mg total) by mouth 2 (two) times daily as needed for mild constipation. 01/31/17   Burgess Amor, PA-C  meloxicam (MOBIC) 15 MG tablet TAKE 1 TABLET BY MOUTH ONCE A DAY. Patient not taking: Reported on 01/29/2017 01/13/17   Eustace Moore, MD  nystatin-triamcinolone ointment [facility]) Apply 1 application topically 2 (two) times daily. Patient not taking: Reported on 01/29/2017 11/25/16   Eustace Moore, MD  phenazopyridine (PYRIDIUM) 200 MG tablet Take 1 tablet (200 mg total) by mouth 3 (three) times daily. 01/31/17   Burgess Amor, PA-C      Family History Family History  Problem Relation Age of Onset  . Diabetes Mother   . Cancer Brother     throat  . COPD Sister   . Kidney disease Sister   . Diabetes Sister   . Diabetes Sister   . Kidney disease Sister     Social History Social History  Substance Use Topics  . Smoking status: Never Smoker  . Smokeless tobacco: Never Used  . Alcohol use Yes     Comment: occasional beer, last 4 days ago     Allergies   Patient has no known allergies.   Review of Systems Review of Systems  Constitutional: Positive for appetite change. Negative for fever.  HENT: Negative for congestion and sore throat.   Eyes: Negative.   Respiratory: Positive for shortness of breath. Negative for wheezing.   Cardiovascular: Positive for chest pain.  Gastrointestinal: Positive for abdominal pain and nausea. Negative for diarrhea and vomiting.  Genitourinary: Negative.   Musculoskeletal: Negative for arthralgias, joint swelling and neck pain.  Skin: Negative.  Negative for rash and wound.  Neurological: Negative for dizziness, weakness, light-headedness, numbness and headaches.  Psychiatric/Behavioral: Negative.      Physical Exam Updated Vital Signs BP (!) 129/56 (BP Location: Left Arm)   Pulse 74   Temp 99.4 F (37.4 C) (Oral)   Resp (!) 21   Ht  (1.854 m)   Wt 109.3 kg   LMP  (LMP Unknown) Comment: last  one years ago   SpO2 97%   BMI 31.80 kg/m   Physical Exam  Constitutional: She appears well-developed and well-nourished.  HENT:  Head: Normocephalic and atraumatic.  Eyes: Conjunctivae are normal.  Neck: Normal range of motion.  Cardiovascular: Normal rate, regular rhythm, normal heart sounds and intact distal pulses.   Pulmonary/Chest: Effort normal and breath sounds normal. She has no wheezes.  Abdominal: Soft. Bowel sounds are normal. She exhibits no distension. There is generalized tenderness. There is no rigidity, no rebound and no guarding.   Musculoskeletal: Normal range of motion.  Neurological: She is alert.  Skin: Skin is warm and dry.  Psychiatric: She has a normal mood and affect.  Nursing note and vitals reviewed.    ED Treatments / Results  Labs (all labs ordered are listed, but only abnormal results are displayed) Labs Reviewed  CBC WITH DIFFERENTIAL/PLATELET - Abnormal; Notable for the following:       Result Value   Hemoglobin 11.7 (*)    HCT 35.5 (*)    Monocytes Absolute 1.1 (*)    All other components within normal limits  COMPREHENSIVE METABOLIC PANEL - Abnormal; Notable for the following:    Potassium 2.9 (*)    Chloride 100 (*)    Glucose,  Bld 135 (*)    Total Protein 8.2 (*)    ALT 55 (*)    All other components within normal limits  URINALYSIS, ROUTINE W REFLEX MICROSCOPIC - Abnormal; Notable for the following:    APPearance HAZY (*)    Hgb urine dipstick MODERATE (*)    Protein, ur 100 (*)    Nitrite POSITIVE (*)    Leukocytes, UA MODERATE (*)    Bacteria, UA MANY (*)    Squamous Epithelial / LPF 0-5 (*)    All other components within normal limits  RAPID URINE DRUG SCREEN, HOSP PERFORMED - Abnormal; Notable for the following:    Tetrahydrocannabinol POSITIVE (*)    All other components within normal limits  LIPASE, BLOOD  ETHANOL  PREGNANCY, URINE  TROPONIN I    EKG  EKG Interpretation  Date/Time:  Friday January 31 2017 09:25:09 EDT Ventricular Rate:  77 PR Interval:    QRS Duration: 105 QT Interval:  398 QTC Calculation: 451 R Axis:   12 Text Interpretation:  Sinus rhythm Baseline wander in lead(s) V3 V4 T wave abnormality Borderline ECG Confirmed by Gerhard Munch  MD [phone]) on 01/31/2017 11:44:23 AM       Radiology Dg Abd Acute W/chest  Result Date: 01/31/2017 CLINICAL DATA:  Right chest pain, cough, fever, congestion. EXAM: DG ABDOMEN ACUTE W/ 1V CHEST COMPARISON:  02/28/2016 FINDINGS: Heart and mediastinal contours are within normal limits. No focal opacities or  effusions. No acute bony abnormality. Prior cholecystectomy. Moderate stool burden in the colon. There is normal bowel gas pattern. No free air. No organomegaly or suspicious calcification. No acute bony abnormality. IMPRESSION: Moderate stool burden. No evidence of bowel obstruction or free air. No active cardiopulmonary disease. Electronically Signed   By: Charlett Nose M.D.   On: 01/31/2017 09:43    Procedures Procedures (including critical care time)  Medications Ordered in ED Medications  phenazopyridine (PYRIDIUM) tablet 200 mg (not administered)  cephALEXin (KEFLEX) capsule 1,000 mg (not administered)  sodium chloride 0.9 % bolus 1,000 mL (0 mLs Intravenous Stopped 01/31/17 1005)  ondansetron (ZOFRAN) injection 4 mg (4 mg Intravenous Given 01/31/17 0832)  gi cocktail (Maalox,Lidocaine,Donnatal) (30 mLs Oral Given 01/31/17 0831)  potassium chloride SA (K-DUR,KLOR-CON) CR tablet 40 mEq (40 mEq Oral Given 01/31/17 1038)  famotidine (PEPCID) tablet 40 mg (40 mg Oral Given 01/31/17 1229)     Initial Impression / Assessment and Plan / ED Course  I have reviewed the triage vital signs and the nursing notes.  Pertinent labs & imaging results that were available during my care of the patient were reviewed by me and considered in my medical decision making (see chart for details).     Initial respiratory rate was a nursing documentation error.  Pt's pain improved after receiving GI cocktail and zofran.  She tolerated PO fluid intake and also received IV fluids before being able to give urine sample.  She endorsed return of her generalized abdominal pain after urinating.  Denies dysuria, flank or lower abd pain.  Urine resulted positive for uti.  cx ordered. Pt placed on keflex, pyridium, also colace recommended for constipation seen on imaging (discussed with her and she does endorse straining with bm's).  Hypokalemia discussed, PO given here,  Short course prescription also given.   Final  Clinical Impressions(s) / ED Diagnoses   Final diagnoses:  Acute cystitis without hematuria  Constipation, unspecified constipation type  Hypokalemia  Nausea vomiting and diarrhea    New Prescriptions New Prescriptions  CEPHALEXIN (KEFLEX) 500 MG CAPSULE    Take 2 capsules (1,000 mg total) by mouth 2 (two) times daily.   DOCUSATE SODIUM (COLACE) 100 MG CAPSULE    Take 1 capsule (100 mg total) by mouth 2 (two) times daily as needed for mild constipation.   PHENAZOPYRIDINE (PYRIDIUM) 200 MG TABLET    Take 1 tablet (200 mg total) by mouth 3 (three) times daily.     Burgess Amor, PA-C 01/31/17 1247    Gerhard Munch, MD 01/31/17 [phone]

## 2017-01-31 NOTE — ED Notes (Signed)
\  [id number]\

## 2017-02-03 LAB — URINE CULTURE

## 2017-02-04 ENCOUNTER — Telehealth: Payer: Self-pay | Admitting: Emergency Medicine

## 2017-02-04 NOTE — Telephone Encounter (Signed)
Post ED Visit - Positive Culture Follow-up  Culture report reviewed by antimicrobial stewardship pharmacist:   Enzo Bi, Pharm.D.  Celedonio Miyamoto, [address].D., BCPS AQ-ID  Garvin Fila, Pharm.D., BCPS  Georgina Pillion, Pharm.D., BCPS  [place], [place].D., BCPS, AAHIVP  Estella Husk, Pharm.D., BCPS, AAHIVP  Lysle Pearl, PharmD, BCPS  Casilda Carls, PharmD, BCPS  Pollyann Samples, PharmD, BCPS  Positive urine culture Treated with cephalexin, organism sensitive to the same and no further patient follow-up is required at this time.  Berle Mull 02/04/2017, 10:21 AM

## 2017-02-06 ENCOUNTER — Telehealth: Payer: Self-pay | Admitting: Family Medicine

## 2017-02-06 NOTE — Telephone Encounter (Signed)
Called Lamika, coming in tomorrow at 230

## 2017-02-06 NOTE — Telephone Encounter (Signed)
I can see her tomorrow at 1040 ?

## 2017-02-06 NOTE — Telephone Encounter (Signed)
Patient calling (and crying) with complaints of pain in the stomach (left side) and vomiting.  She states she's already been to [facility] ED and was prescribed something to settle her stomach, but it is not working.  She says that she was treated recently for bladder infection, but it seems to be getting better.  She has an appt on Monday w/Dr. Lodema Hong, but feels that she cannot wait that long.  Please advise.

## 2017-02-07 ENCOUNTER — Ambulatory Visit: Payer: Self-pay | Admitting: Family Medicine

## 2017-02-10 ENCOUNTER — Ambulatory Visit: Payer: Self-pay | Admitting: Family Medicine

## 2017-02-21 IMAGING — CT CT ABD-PELV W/ CM
2 of 5 series · 15 of 46 positions shown, 17 images · IV contrast (Omnipaque 300)
Comparison: 02/26/2012

CLINICAL DATA: Acute epigastric pain and back pain for 2 weeks with
nausea and vomiting. History of cholecystectomy and hysterectomy.

EXAM:
CT ABDOMEN AND PELVIS WITH CONTRAST
TECHNIQUE: Multidetector CT imaging of the abdomen and pelvis was performed
using the standard protocol following bolus administration of
intravenous contrast.
CONTRAST:  100mL OMNIPAQUE IOHEXOL 300 MG/ML  SOLN

[Series 2: abd_pel_with 5.0 b40f · axial · 0.66mm/px · z∈[-476,-66]mm · 12 of 94 slices shown, 14 images]
[im 6/94  soft-tissue]
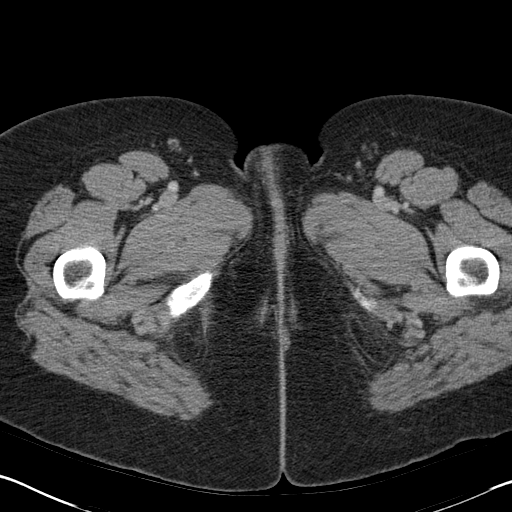
[im 6/94  bone]
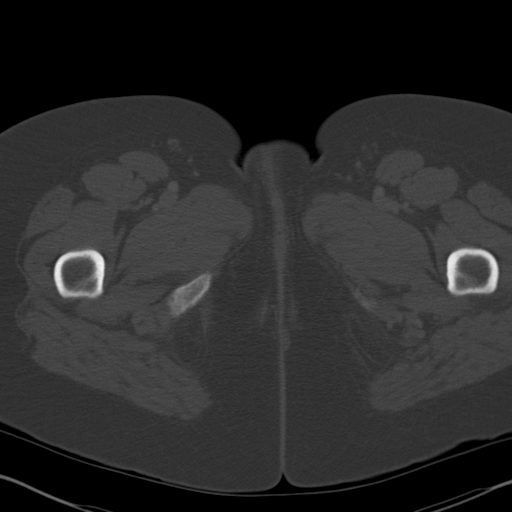
[im 16/94  soft-tissue]
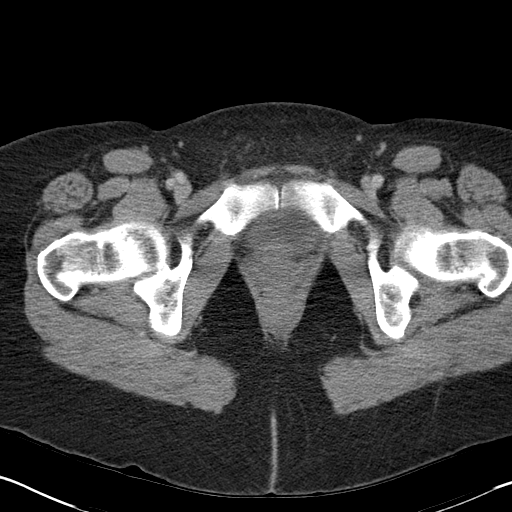
[im 21/94  soft-tissue]
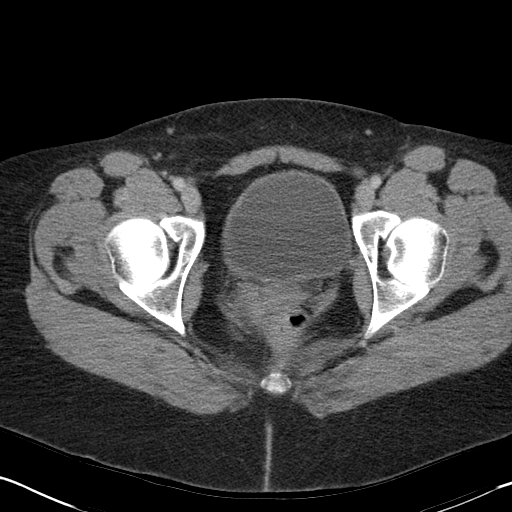
[im 26/94  soft-tissue]
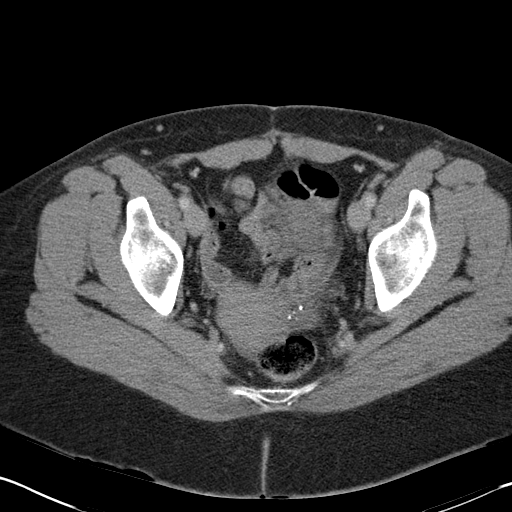
[im 37/94  soft-tissue]
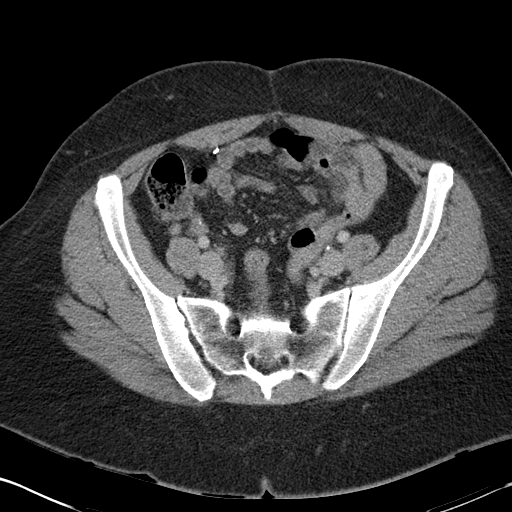
[im 42/94  soft-tissue]
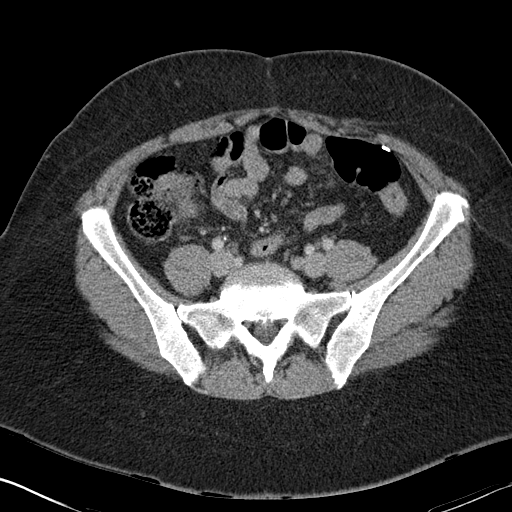
[im 52/94  soft-tissue]
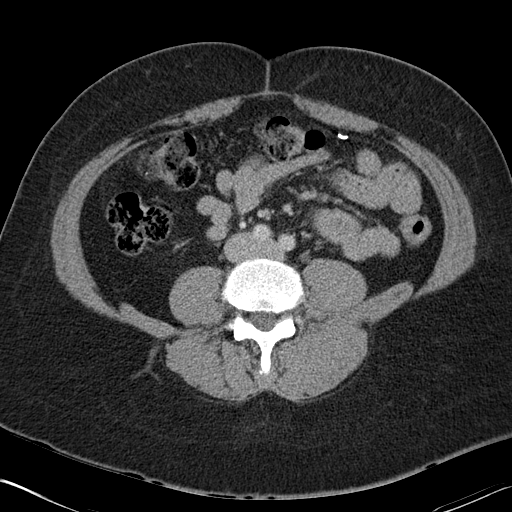
[im 57/94  soft-tissue]
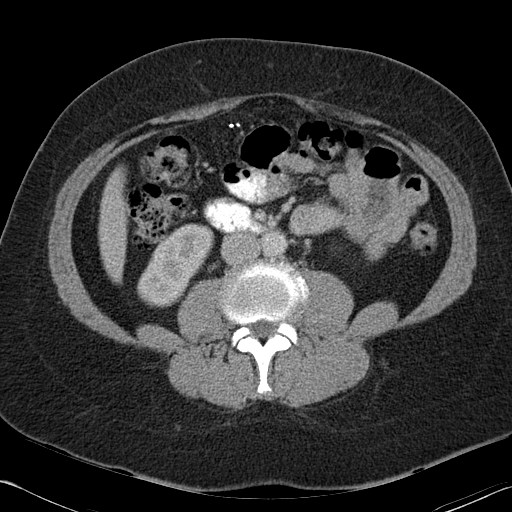
[im 68/94  soft-tissue]
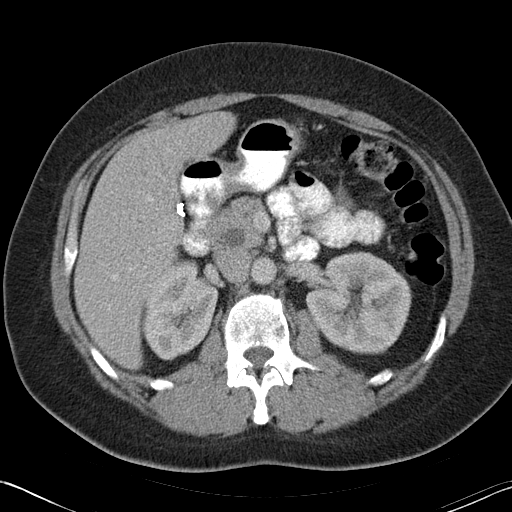
[im 68/94  bone]
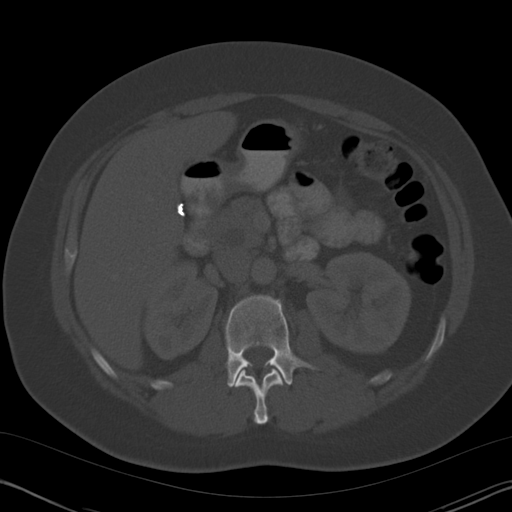
[im 73/94  soft-tissue]
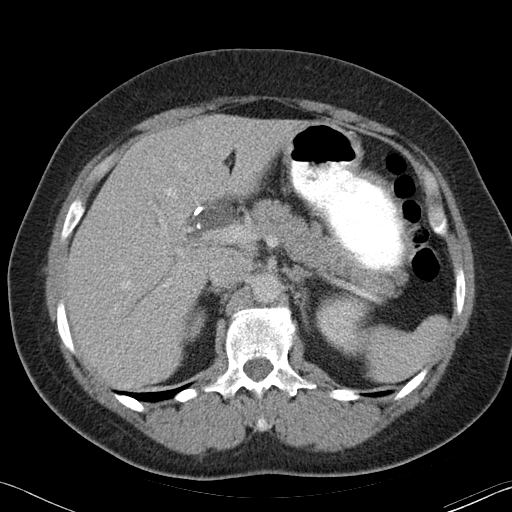
[im 78/94  soft-tissue]
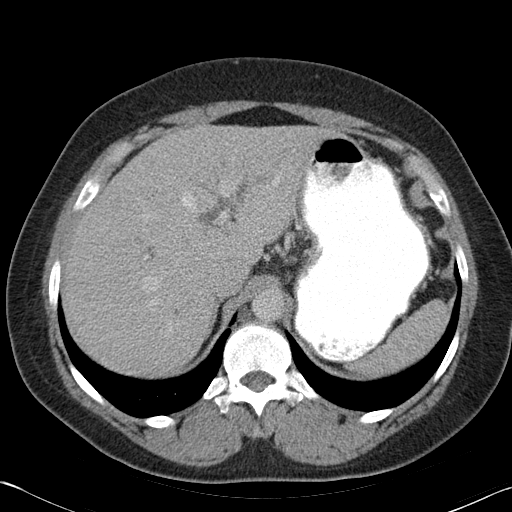
[im 88/94  soft-tissue]
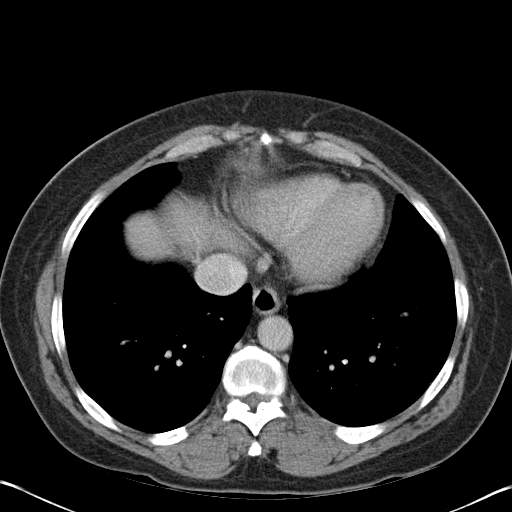

[Series 5: abd_pel_with 3.0 spo · coronal · 0.65mm/px · 3 of 94 slices shown]
[im 32/94  soft-tissue]
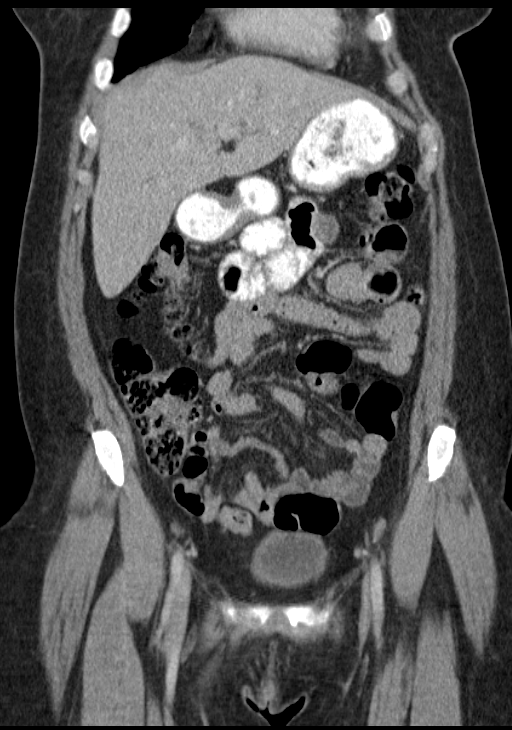
[im 42/94  soft-tissue]
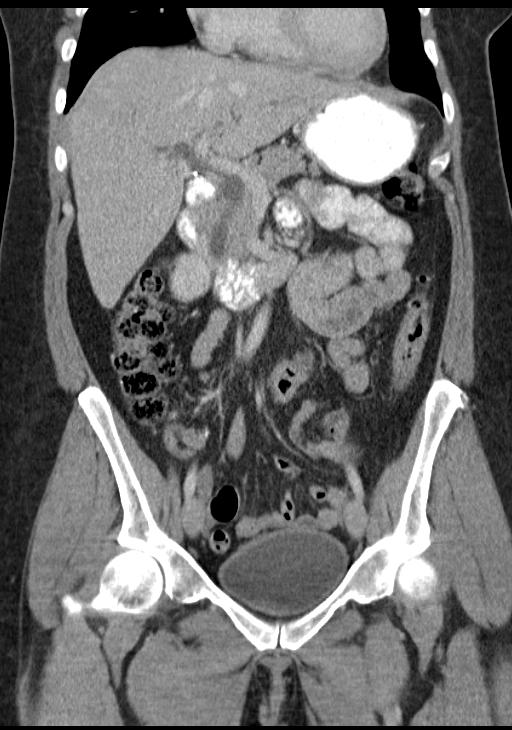
[im 52/94  soft-tissue]
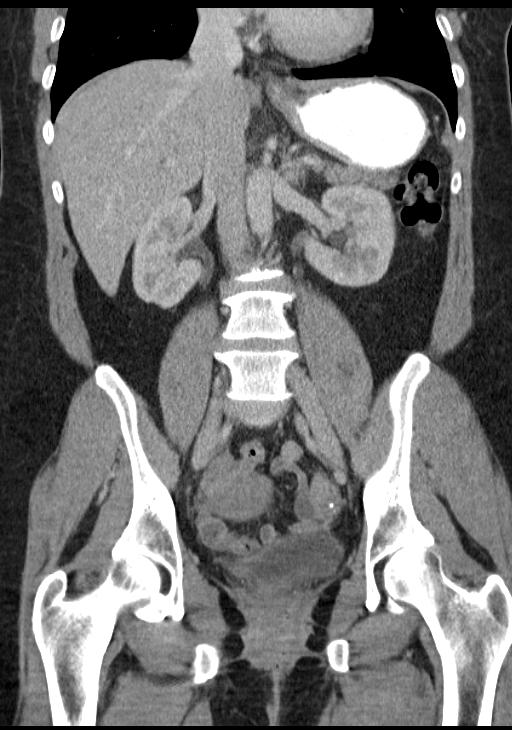

[15 of 46 positions shown; findings below may reference images not displayed]

FINDINGS: Lower chest: Clear lung bases. Normal heart size. No pericardial or
pleural effusion.

Abdomen: Prior cholecystectomy. Increased diffuse intra and
extrahepatic biliary dilatation compared to 02/26/2012. Biliary
dilatation extends to the ampulla. Distal common bile duct diameter
is 14 mm, image 26. No associated pancreatic duct dilatation.

No other focal hepatic abnormality.

Pancreas, spleen, adrenal glands, and kidneys are within normal
limits for age and demonstrate no acute process.

Negative for bowel obstruction, dilatation, ileus, or free air.
Postop changes throughout the anterior abdomen as before.

No abdominal free fluid, fluid collection, hemorrhage, abscess or
adenopathy.

Negative for aneurysm.  No retroperitoneal abnormality.

Normal appendix in the right lower quadrant.

Pelvis: Trace pelvic free fluid, likely physiologic. Urinary bladder
unremarkable. No pelvic hemorrhage, hematoma, abscess, adenopathy,
inguinal abnormality, or hernia. Uterus and adnexal normal in size.

Endplate degenerative changes of the lumbar spine. No acute
compression fracture or acute osseous finding.
IMPRESSION: Increased intra and extrahepatic biliary dilatation. A component of
this is likely secondary to prior cholecystectomy. Distal CBD
obstruction or an ampullary abnormality cannot be excluded. Consider
further evaluation with ERCP/MRCP and correlation with liver
function panel.

No associated pancreatic abnormality or pancreatic ductal
dilatation.

Stable postoperative findings in the abdomen

No other acute finding or abscess.

## 2017-02-22 IMAGING — RF DG ERCP WO/W SPHINCTEROTOMY
1 series · 10 of 10 positions shown · non-contrast
Comparison: CT of the abdomen and pelvis 01/11/2015.

CLINICAL DATA: 53-year-old female with history of common bile duct
obstruction. ERCP.

EXAM:
ERCP
TECHNIQUE: Multiple spot images obtained with the fluoroscopic device and
submitted for interpretation post-procedure.

[Series 1: run · 4 acquisitions, 10 frames shown]
[im 1/4]
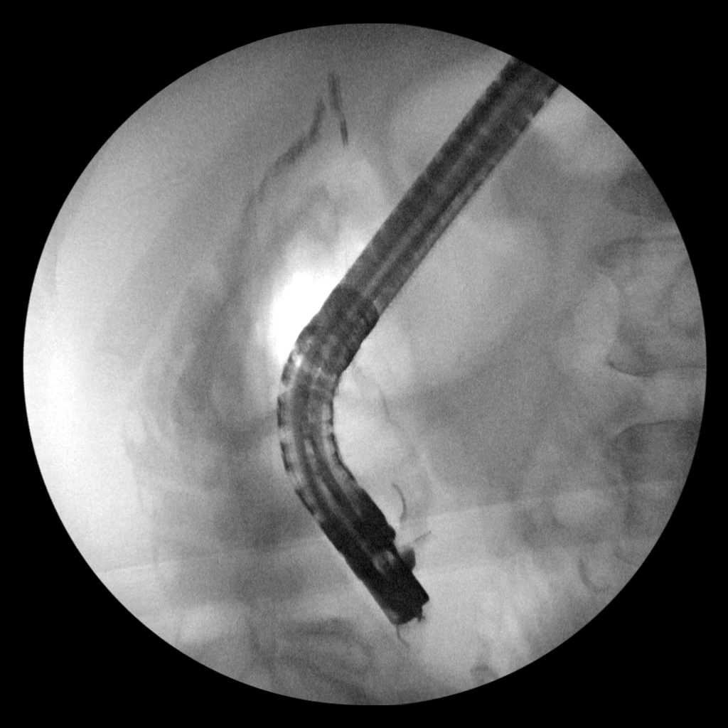
[im 2/4]
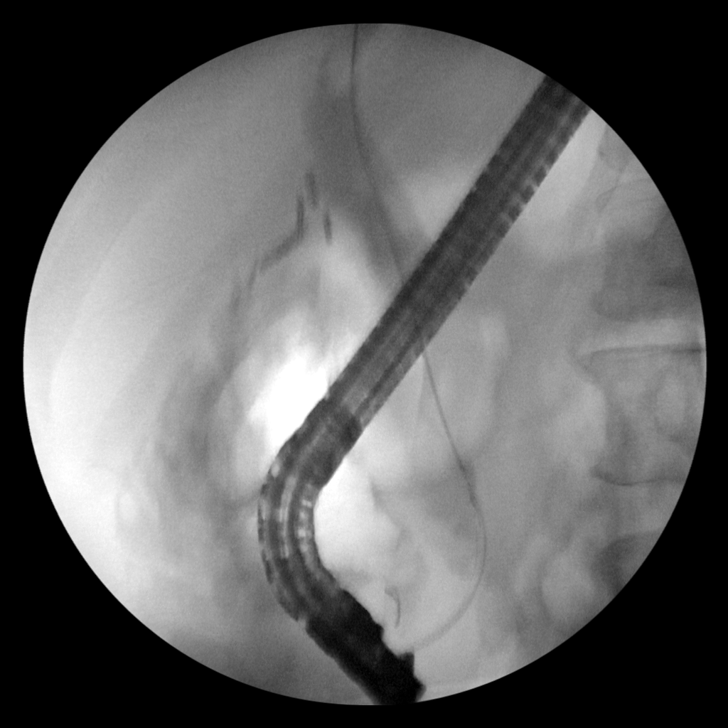
[im 2/4]
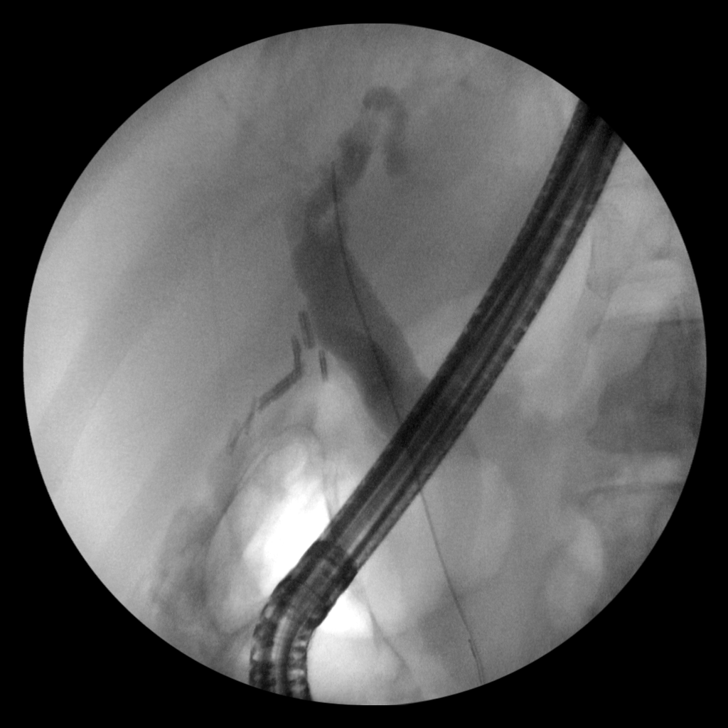
[im 2/4]
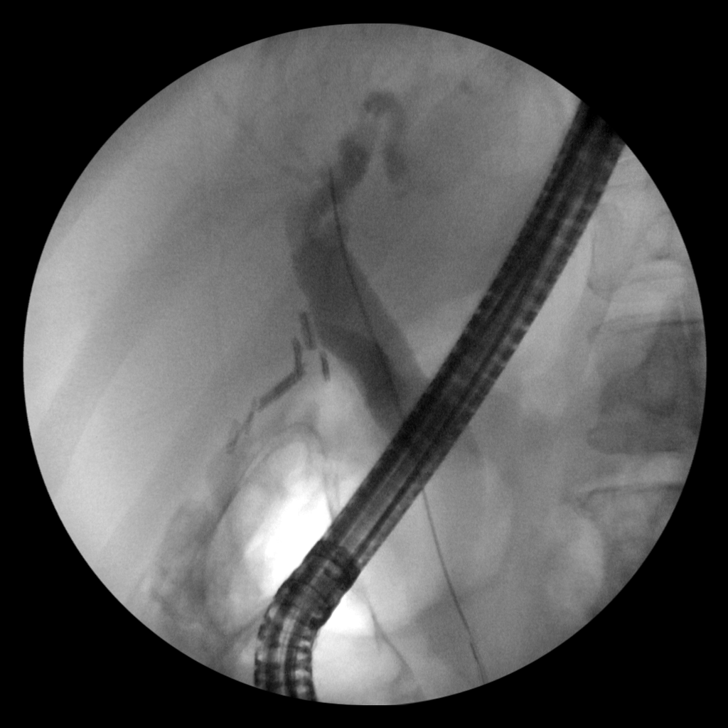
[im 2/4]
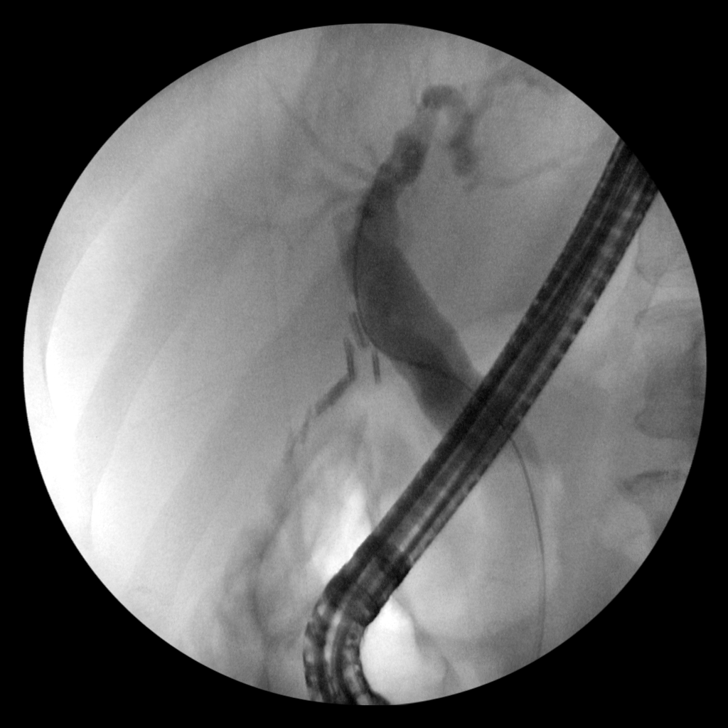
[im 3/4]
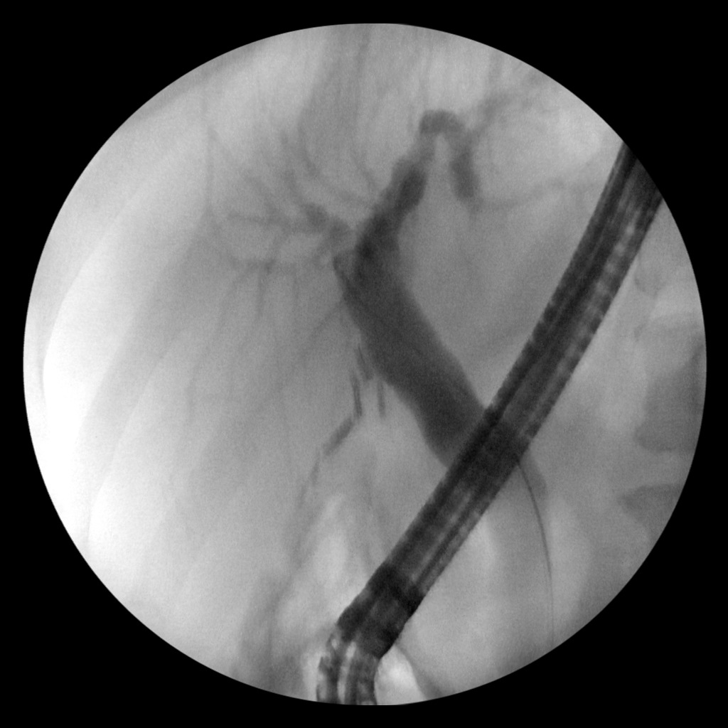
[im 3/4]
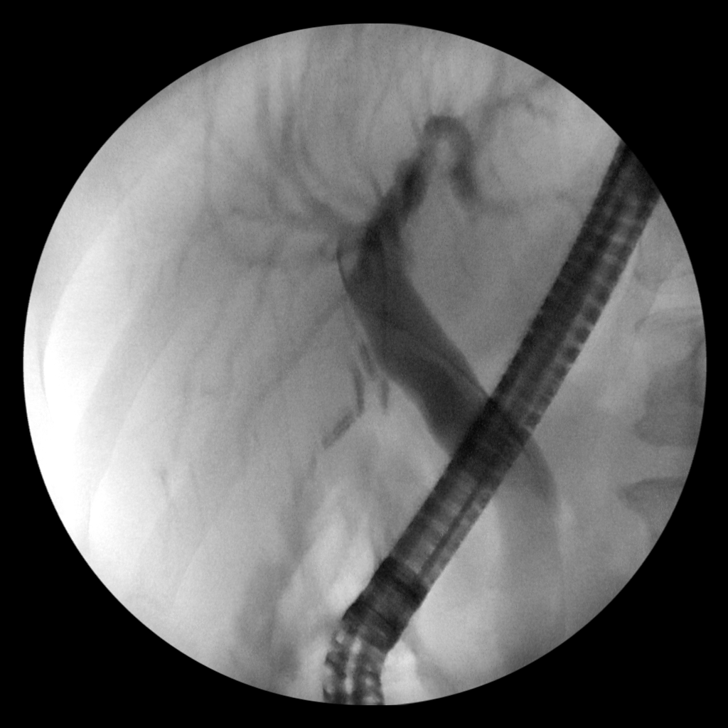
[im 3/4]
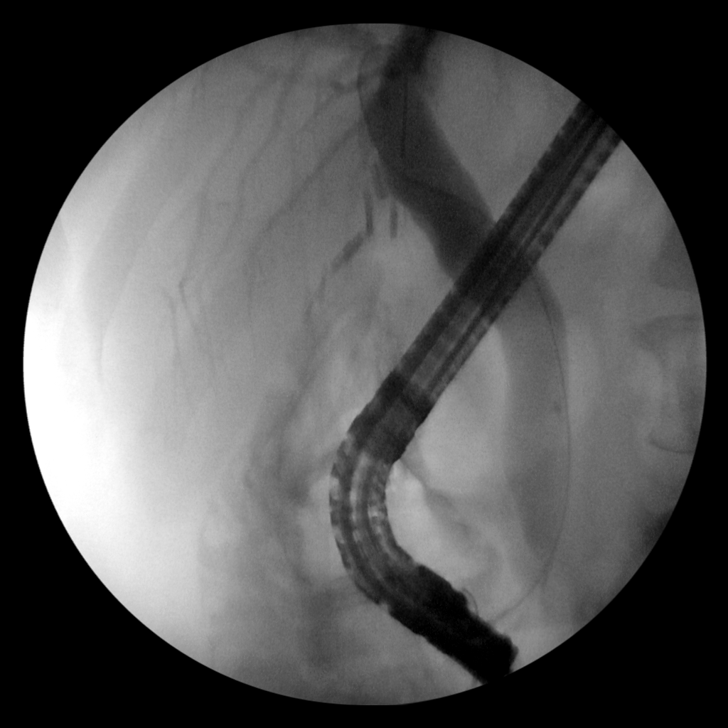
[im 3/4]
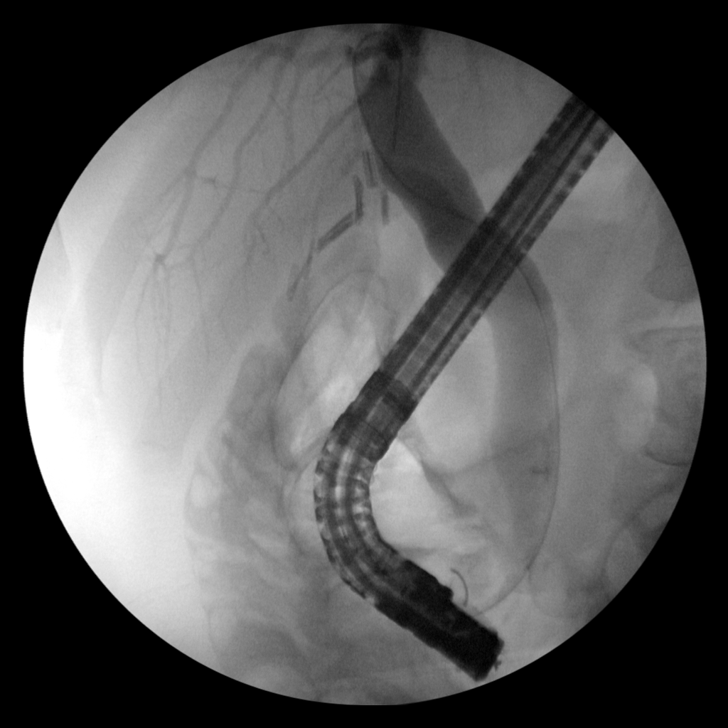
[im 4/4]
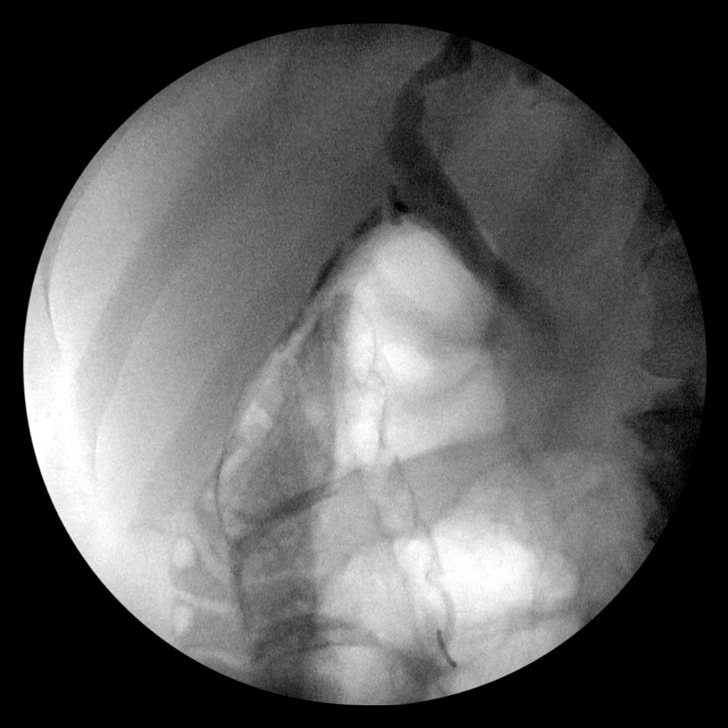

[10 of 10 positions shown; findings below may reference images not displayed]

FINDINGS: Multiple ERCP images are submitted for evaluation. These images
demonstrate cannulation of the common bile duct, followed by
injection of the duct. This retrograde cholangiogram demonstrates a
very dilated common bile duct, dilated cystic duct remnant, and mild
to moderate dilatation of the intrahepatic biliary tree. No definite
filling defect was identified to suggest presence of a ductal stone.
Per report from Dr. Saechao, the patient had papillary stenosis at
the time of the examination.
IMPRESSION: 1. Dilated intra and extrahepatic biliary ductal system, without
evidence of choledocholithiasis. Findings are compatible with the
reported diagnosis of papillary stenosis.
These images were submitted for radiologic interpretation only.
Please see the procedural report for the amount of contrast and the
fluoroscopy time utilized.

## 2017-03-06 ENCOUNTER — Other Ambulatory Visit: Payer: Self-pay | Admitting: Family Medicine

## 2017-04-28 ENCOUNTER — Ambulatory Visit: Payer: Self-pay | Admitting: Family Medicine

## 2017-06-10 ENCOUNTER — Telehealth: Payer: Self-pay | Admitting: Family Medicine

## 2017-06-10 NOTE — Telephone Encounter (Signed)
Patient calling to find out if Dr. Delton See can "upgrade her disability".  Patient states that she has some forms that need to be filled out. She cannot remember who filled out forms in the past.  Patient has cancelled and been a  NO SHOW for recent apppointments.  We discussed the importance of keeping the appointments when made.

## 2017-06-10 NOTE — Telephone Encounter (Signed)
Lisabeth has an appt [phone]   To see dr Delton See

## 2017-06-27 ENCOUNTER — Ambulatory Visit: Payer: Self-pay | Admitting: Family Medicine

## 2017-06-27 ENCOUNTER — Ambulatory Visit (INDEPENDENT_AMBULATORY_CARE_PROVIDER_SITE_OTHER): Payer: Medicaid Other | Admitting: Family Medicine

## 2017-06-27 ENCOUNTER — Encounter: Payer: Self-pay | Admitting: Family Medicine

## 2017-06-27 VITALS — BP 138/82 | HR 92 | Temp 98.1°F | Resp 18 | Ht 73.0 in | Wt 232.1 lb

## 2017-06-27 DIAGNOSIS — R300 Dysuria: Secondary | ICD-10-CM | POA: Diagnosis not present

## 2017-06-27 DIAGNOSIS — R1013 Epigastric pain: Secondary | ICD-10-CM | POA: Diagnosis not present

## 2017-06-27 DIAGNOSIS — R1084 Generalized abdominal pain: Secondary | ICD-10-CM | POA: Diagnosis not present

## 2017-06-27 DIAGNOSIS — Z23 Encounter for immunization: Secondary | ICD-10-CM | POA: Diagnosis not present

## 2017-06-27 LAB — POCT URINALYSIS DIPSTICK
Glucose, UA: NEGATIVE
Ketones, UA: 15
LEUKOCYTES UA: NEGATIVE
Nitrite, UA: POSITIVE
PH UA: 5.5 (ref 5.0–8.0)
Spec Grav, UA: >=1.03 — AB (ref 1.010–1.025)
Urobilinogen, UA: 1 E.U./dL

## 2017-06-27 MED ORDER — PANTOPRAZOLE SODIUM 20 MG PO TBEC: 20.0000 mg | DELAYED_RELEASE_TABLET | Freq: Every day | ORAL | 1 refills | Status: DC

## 2017-06-27 MED ORDER — CIPROFLOXACIN HCL 250 MG PO TABS: 250.0000 mg | ORAL_TABLET | Freq: Two times a day (BID) | ORAL | 0 refills | Status: DC

## 2017-06-27 MED ORDER — DOCUSATE SODIUM 100 MG PO CAPS: 100.0000 mg | ORAL_CAPSULE | Freq: Two times a day (BID) | ORAL | 3 refills | Status: DC | PRN

## 2017-06-27 NOTE — Progress Notes (Signed)
Chief Complaint  Patient presents with  . Abdominal Pain    left side x 2 days   Recurrence of abdominal pain .  Heart burn and decreased appetite.  Has lost weight.  Also thinks has another UTI.  Not sexually active at present.  No  Vomiting or diarrhea.  No sweats or chills.  Last BM "last week" BP is good Depression is stable Still with knee pain from arthritis and regular back pain Disabled due to poor education/mental impairment, illiteracy    Patient Active Problem List   Diagnosis Date Noted  . Vitamin D deficiency 09/17/2016  . Literacy level of illiterate 09/16/2016  . Mental impairment 09/16/2016  . Colonoscopy refused 09/16/2016  . Elevated liver enzymes 01/11/2015  . Essential hypertension 01/11/2015  . Depression 01/11/2015  . History of cholecystectomy 01/11/2015    Outpatient Encounter Prescriptions as of 06/27/2017  Medication Sig  . citalopram (CELEXA) 20 MG tablet Take 1 tablet (20 mg total) by mouth daily.  Marland Kitchen docusate sodium (COLACE) 100 MG capsule Take 1 capsule (100 mg total) by mouth 2 (two) times daily as needed for mild constipation.  . hydrochlorothiazide (HYDRODIURIL) 12.5 MG tablet Take 1 tablet (12.5 mg total) by mouth daily.  . meloxicam (MOBIC) 15 MG tablet TAKE 1 TABLET BY MOUTH ONCE A DAY.  . pantoprazole (PROTONIX) 20 MG tablet Take 1 tablet (20 mg total) by mouth daily. For heartburn  . ciprofloxacin (CIPRO) 250 MG tablet Take 1 tablet (250 mg total) by mouth 2 (two) times daily.   No facility-administered encounter medications on file as of 06/27/2017.     No Known Allergies  Review of Systems  Constitutional: Negative for diaphoresis and fever.  HENT: Negative for congestion and dental problem.   Eyes: Negative.  Negative for visual disturbance.  Respiratory: Negative.  Negative for cough.   Cardiovascular: Negative.  Negative for chest pain and palpitations.  Gastrointestinal: Positive for abdominal pain, constipation and nausea.  Negative for diarrhea and vomiting.  Genitourinary: Positive for dysuria. Negative for difficulty urinating, hematuria, menstrual problem, vaginal bleeding and vaginal discharge.  Musculoskeletal: Positive for arthralgias, back pain, gait problem and joint swelling.  Neurological: Negative for dizziness and headaches.  Psychiatric/Behavioral: Negative.  Negative for self-injury.    BP 138/82 (BP Location: Right Arm, Patient Position: Sitting, Cuff Size: Large)   Pulse 92   Temp 98.1 F (36.7 C) (Temporal)   Resp 18   Ht  (1.854 m)   Wt 232 lb 1.9 oz (105.3 kg)   LMP  (LMP Unknown) Comment: last  one years ago   SpO2 99%   BMI 30.62 kg/m   Physical Exam  Constitutional: She is oriented to person, place, and time. She appears well-developed and well-nourished.  HENT:  Head: Normocephalic and atraumatic.  Right Ear: External ear normal.  Left Ear: External ear normal.  Mouth/Throat: Oropharynx is clear and moist.  Eyes: Pupils are equal, round, and reactive to light. Conjunctivae are normal.  Neck: Normal range of motion. Neck supple. No thyromegaly present.  Cardiovascular: Normal rate, regular rhythm and normal heart sounds.   Pulmonary/Chest: Effort normal and breath sounds normal. No respiratory distress.  Abdominal: Soft. Bowel sounds are normal. She exhibits no mass. There is tenderness. There is no rebound and no guarding.  No CVAT.  Mild tenderness deep palpation L mid abdomen and epigastrium  Musculoskeletal: Normal range of motion. She exhibits no edema.  Lymphadenopathy:    She has no cervical adenopathy.  Neurological: She is alert and oriented to person, place, and time.  Gait normal  Skin: Skin is warm and dry.  Psychiatric: She has a normal mood and affect. Her behavior is normal. Thought content normal.  Poor fund knowlege  Nursing note and vitals reviewed.   ASSESSMENT/PLAN:  1. Need for influenza vaccination - Flu Vaccine QUAD 36+ mos IM  2.  Generalized abdominal pain Has heartburn and a UTI.  Possible constipation. - POCT Urinalysis Dipstick  3. Dyspepsia  4. Dysuria - Urine Culture   Patient Instructions  Take the pantoprazole once a day This is for heartburn and stomach pain Take the colace 2 times a day when needed This is for constipation Take the antibiotic 2 times a day for 3 days This is for infection in the urine Drink more water  See me in 3 months Call or come sooner for problems    Eustace MooreYvonne Sue Nelson, MD

## 2017-06-27 NOTE — Patient Instructions (Signed)
Take the pantoprazole once a day This is for heartburn and stomach pain Take the colace 2 times a day when needed This is for constipation Take the antibiotic 2 times a day for 3 days This is for infection in the urine Drink more water  See me in 3 months Call or come sooner for problems

## 2017-06-30 LAB — URINE CULTURE
MICRO NUMBER:: 80987322
SPECIMEN QUALITY:: ADEQUATE

## 2017-07-01 ENCOUNTER — Telehealth: Payer: Self-pay

## 2017-07-01 NOTE — Telephone Encounter (Signed)
Pt has gotten, and taken her cipro.

## 2017-07-01 NOTE — Telephone Encounter (Signed)
-----   Message from Janet MooreYvonne Sue Nelson, MD sent at 07/01/2017  3:11 PM EDT ----- Needs 3 d of cipro sent to pharmacy please - if not done at visit

## 2017-07-03 ENCOUNTER — Other Ambulatory Visit: Payer: Self-pay

## 2017-07-03 ENCOUNTER — Encounter: Payer: Self-pay | Admitting: Family Medicine

## 2017-07-03 ENCOUNTER — Ambulatory Visit (INDEPENDENT_AMBULATORY_CARE_PROVIDER_SITE_OTHER): Payer: Medicaid Other | Admitting: Family Medicine

## 2017-07-03 VITALS — BP 144/84 | HR 68 | Temp 97.0°F | Resp 18 | Ht 73.0 in | Wt 237.0 lb

## 2017-07-03 DIAGNOSIS — M25561 Pain in right knee: Secondary | ICD-10-CM

## 2017-07-03 DIAGNOSIS — Z23 Encounter for immunization: Secondary | ICD-10-CM

## 2017-07-03 DIAGNOSIS — B354 Tinea corporis: Secondary | ICD-10-CM | POA: Diagnosis not present

## 2017-07-03 DIAGNOSIS — M25562 Pain in left knee: Secondary | ICD-10-CM

## 2017-07-03 DIAGNOSIS — M25461 Effusion, right knee: Secondary | ICD-10-CM | POA: Diagnosis not present

## 2017-07-03 DIAGNOSIS — G8929 Other chronic pain: Secondary | ICD-10-CM | POA: Diagnosis not present

## 2017-07-03 MED ORDER — NABUMETONE 750 MG PO TABS: 750.0000 mg | ORAL_TABLET | Freq: Two times a day (BID) | ORAL | 11 refills | Status: DC

## 2017-07-03 MED ORDER — CLOTRIMAZOLE-BETAMETHASONE 1-0.05 % EX CREA: 1.0000 "application " | TOPICAL_CREAM | Freq: Two times a day (BID) | CUTANEOUS | 0 refills | Status: DC

## 2017-07-03 MED ORDER — METHYLPREDNISOLONE ACETATE 80 MG/ML IJ SUSP
80.0000 mg | Freq: Once | INTRAMUSCULAR | Status: AC
  Administered 2017-07-03: 80 mg via INTRA_ARTICULAR

## 2017-07-03 MED ORDER — SULINDAC 150 MG PO TABS: 150.0000 mg | ORAL_TABLET | Freq: Two times a day (BID) | ORAL | 1 refills | Status: DC

## 2017-07-03 NOTE — Progress Notes (Signed)
Chief Complaint  Patient presents with  . Rash    BLE   2 issues 1. Rash that itches terribly - spots on both legs for a couple of weeks 2. Knee pain, R, from arthritis.  The injection in Feb helped for months but now it hurts again.  mobic "doesnt work anymore".   Will try another NSAID.  Discussed avoiding narcotics for chronic pain.  Requests injection.  Patient Active Problem List   Diagnosis Date Noted  . Vitamin D deficiency 09/17/2016  . Literacy level of illiterate 09/16/2016  . Mental impairment 09/16/2016  . Colonoscopy refused 09/16/2016  . Elevated liver enzymes 01/11/2015  . Essential hypertension 01/11/2015  . Depression 01/11/2015  . History of cholecystectomy 01/11/2015    Outpatient Encounter Prescriptions as of 07/03/2017  Medication Sig  . citalopram (CELEXA) 20 MG tablet Take 1 tablet (20 mg total) by mouth daily.  Marland Kitchen docusate sodium (COLACE) 100 MG capsule Take 1 capsule (100 mg total) by mouth 2 (two) times daily as needed for mild constipation.  . hydrochlorothiazide (HYDRODIURIL) 12.5 MG tablet Take 1 tablet (12.5 mg total) by mouth daily.  . pantoprazole (PROTONIX) 20 MG tablet Take 1 tablet (20 mg total) by mouth daily. For heartburn  . clotrimazole-betamethasone (LOTRISONE) cream Apply 1 application topically 2 (two) times daily.  . nabumetone (RELAFEN) 750 MG tablet Take 1 tablet (750 mg total) by mouth 2 (two) times daily.   No facility-administered encounter medications on file as of 07/03/2017.     No Known Allergies  Review of Systems  Constitutional: Positive for activity change.       Knee pain  HENT: Negative.  Negative for congestion and dental problem.   Eyes: Negative.  Negative for visual disturbance.  Respiratory: Negative.  Negative for cough and shortness of breath.   Cardiovascular: Negative.  Negative for chest pain and palpitations.  Musculoskeletal: Positive for arthralgias and gait problem.  Skin: Positive for rash.  All  other systems reviewed and are negative. otherwise is well   BP (!) 144/84 (BP Location: Right Arm, Patient Position: Sitting, Cuff Size: Large)   Pulse 68   Temp (!) 97 F (36.1 C) (Temporal)   Resp 18   Ht  (1.854 m)   Wt 237 lb (107.5 kg)   LMP  (LMP Unknown) Comment: last  one years ago   SpO2 100%   BMI 31.27 kg/m   Physical Exam  Constitutional: She appears well-developed and well-nourished.  Very antalgic gait  HENT:  Head: Normocephalic and atraumatic.  Mouth/Throat: Oropharynx is clear and moist.  Cardiovascular: Normal rate, regular rhythm and normal heart sounds.   Pulmonary/Chest: Effort normal and breath sounds normal.  Musculoskeletal:  right knee with warmth and effusion, tender medial joint line, no instablility  Skin: Rash noted.  Circular, raised, excoriated patches right calf and thigh examined  Psychiatric: She has a normal mood and affect. Her behavior is normal.   Time out Consent Appropriate site lateral (   R     )  knee prepped and marked Area infiltrated with a 1 cc 1% lidocaine wheal Knee joint injected with 3 cc of 1% lidocaine and 80 mg of DepoMedrol Patient tolerated procedure well Post injection instructions reviewed  ASSESSMENT/PLAN:  1. Ringworm, body lotrisone  2. Chronic pain of both knees - PR DRAIN/INJECT LARGE JOINT/BURSA - methylPREDNISolone acetate (DEPO-MEDROL) injection 80 mg; Inject 1 mL (80 mg total) into the articular space once.  3. Knee effusion,  right - PR DRAIN/INJECT LARGE JOINT/BURSA - methylPREDNISolone acetate (DEPO-MEDROL) injection 80 mg; Inject 1 mL (80 mg total) into the articular space once.   Patient Instructions  Use the cream twice a day until the rash clears up  Use ice on knee for 20 minutes every couple of hours to take down pain and swelling  Stop the meloxicam Start relafen twice a day for knee pain May also take acetaminophen (tylenol) for pain   Eustace MooreYvonne Sue Adib Wahba, MD

## 2017-07-03 NOTE — Patient Instructions (Addendum)
Use the cream twice a day until the rash clears up  Use ice on knee for 20 minutes every couple of hours to take down pain and swelling  Stop the meloxicam Start relafen twice a day for knee pain May also take acetaminophen (tylenol) for pain

## 2017-07-07 ENCOUNTER — Telehealth: Payer: Self-pay | Admitting: Family Medicine

## 2017-07-07 NOTE — Telephone Encounter (Signed)
Patient left a msg that the medication sent in for her rash is $37 and she cant afford it, she is requesting something that medicaid will cover

## 2017-07-08 NOTE — Telephone Encounter (Signed)
Patient calling back to let you know that one of the medications (she doesn't know which one) was $37 and medicaid will not cover it.  She states she has the cream and the sulindac.  It was called in @ [facility]    cb [phone]

## 2017-07-09 NOTE — Telephone Encounter (Signed)
Spoke to [place] yesterday, has picked up meds at pharmacy,

## 2017-07-25 ENCOUNTER — Encounter (HOSPITAL_COMMUNITY): Payer: Self-pay | Admitting: Emergency Medicine

## 2017-07-25 ENCOUNTER — Emergency Department (HOSPITAL_COMMUNITY): Payer: Medicaid Other

## 2017-07-25 ENCOUNTER — Emergency Department (HOSPITAL_COMMUNITY)
Admission: EM | Admit: 2017-07-25 | Discharge: 2017-07-25 | Disposition: A | Discharge status: Home / Self Care | Payer: Medicaid Other | Attending: Emergency Medicine | Admitting: Emergency Medicine

## 2017-07-25 DIAGNOSIS — R51 Headache: Secondary | ICD-10-CM | POA: Insufficient documentation

## 2017-07-25 DIAGNOSIS — I10 Essential (primary) hypertension: Secondary | ICD-10-CM | POA: Diagnosis not present

## 2017-07-25 DIAGNOSIS — Z79899 Other long term (current) drug therapy: Secondary | ICD-10-CM | POA: Diagnosis not present

## 2017-07-25 DIAGNOSIS — R519 Headache, unspecified: Secondary | ICD-10-CM

## 2017-07-25 LAB — CBC WITH DIFFERENTIAL/PLATELET
BASOS ABS: 0.1 10*3/uL (ref 0.0–0.1)
BASOS PCT: 1 %
EOS ABS: 0.2 10*3/uL (ref 0.0–0.7)
EOS PCT: 2 %
HCT: 36.7 % (ref 36.0–46.0)
Hemoglobin: 12 g/dL (ref 12.0–15.0)
Lymphocytes Relative: 33 %
Lymphs Abs: 3 10*3/uL (ref 0.7–4.0)
MCH: 29 pg (ref 26.0–34.0)
MCHC: 32.7 g/dL (ref 30.0–36.0)
MCV: 88.6 fL (ref 78.0–100.0)
MONO ABS: 0.7 10*3/uL (ref 0.1–1.0)
Monocytes Relative: 8 %
Neutro Abs: 5.2 10*3/uL (ref 1.7–7.7)
Neutrophils Relative %: 56 %
PLATELETS: 316 10*3/uL (ref 150–400)
RBC: 4.14 MIL/uL (ref 3.87–5.11)
RDW: 15.2 % (ref 11.5–15.5)
WBC: 9.1 10*3/uL (ref 4.0–10.5)

## 2017-07-25 LAB — BASIC METABOLIC PANEL
ANION GAP: 9 (ref 5–15)
BUN: 9 mg/dL (ref 6–20)
CALCIUM: 9.4 mg/dL (ref 8.9–10.3)
CO2: 27 mmol/L (ref 22–32)
CREATININE: 0.83 mg/dL (ref 0.44–1.00)
Chloride: 105 mmol/L (ref 101–111)
GFR calc Af Amer: >60 mL/min (ref >60)
GFR calc non Af Amer: >60 mL/min (ref >60)
GLUCOSE: 117 mg/dL — AB (ref 65–99)
Potassium: 3.5 mmol/L (ref 3.5–5.1)
Sodium: 141 mmol/L (ref 135–145)

## 2017-07-25 MED ORDER — KETOROLAC TROMETHAMINE 30 MG/ML IJ SOLN
30.0000 mg | Freq: Once | INTRAMUSCULAR | Status: AC
  Administered 2017-07-25: 30 mg via INTRAVENOUS
  Filled 2017-07-25: qty 1

## 2017-07-25 MED ORDER — TRAMADOL HCL 50 MG PO TABS: 50.0000 mg | ORAL_TABLET | Freq: Four times a day (QID) | ORAL | 0 refills | Status: DC | PRN

## 2017-07-25 MED ORDER — HYDROMORPHONE HCL 1 MG/ML IJ SOLN
1.0000 mg | Freq: Once | INTRAMUSCULAR | Status: AC
  Administered 2017-07-25: 1 mg via INTRAVENOUS
  Filled 2017-07-25: qty 1

## 2017-07-25 MED ORDER — ONDANSETRON HCL 4 MG/2ML IJ SOLN
4.0000 mg | Freq: Once | INTRAMUSCULAR | Status: AC
  Administered 2017-07-25: 4 mg via INTRAVENOUS
  Filled 2017-07-25: qty 2

## 2017-07-25 NOTE — ED Triage Notes (Addendum)
Patient c/o headache x2 days with nausea and vomiting. Patient states sensitivity to light and sound. Denies any neurological deficits. Patient reports taking tylenol with no relief-last dose 30 minutes prior to arriving to ER.

## 2017-07-25 NOTE — ED Provider Notes (Signed)
[facility] Provider Note   CSN: [id number] Arrival date & time: 07/25/17  1553     History   Chief Complaint Chief Complaint  Patient presents with  . Headache    HPI Janet Mcguire is a 56 y.o. female.  Patient complains of severe headache with nausea that started today.   The history is provided by the patient. No language interpreter was used.  Headache   This is a new problem. The current episode started 12 to 24 hours ago. The problem occurs constantly. The problem has not changed since onset.The headache is associated with nothing. The quality of the pain is described as dull. The pain is at a severity of 7/10. The pain is moderate. The pain does not radiate.    Past Medical History:  Diagnosis Date  . Allergy   . Anxiety   . Depression   . Hypertension   . Substance abuse (HCC)    "years ago"    Patient Active Problem List   Diagnosis Date Noted  . Vitamin D deficiency 09/17/2016  . Literacy level of illiterate 09/16/2016  . Mental impairment 09/16/2016  . Colonoscopy refused 09/16/2016  . Elevated liver enzymes 01/11/2015  . Essential hypertension 01/11/2015  . Depression 01/11/2015  . History of cholecystectomy 01/11/2015    Past Surgical History:  Procedure Laterality Date  . CHOLECYSTECTOMY    . ERCP N/A 01/12/2015   Procedure: ENDOSCOPIC RETROGRADE CHOLANGIOPANCREATOGRAPHY (ERCP) ;  Surgeon: Malissa Hippo, MD;  Location: [facility];  Service: Endoscopy;  Laterality: N/A;  Balloon Extraction  . SPHINCTEROTOMY N/A 01/12/2015   Procedure: SPHINCTEROTOMY;  Surgeon: Malissa Hippo, MD;  Location: [facility];  Service: Endoscopy;  Laterality: N/A;  . TUBAL LIGATION      OB History    Gravida Para Term Preterm AB Living   SAB TAB Ectopic Multiple Live Births     2             Home Medications    Prior to Admission medications   Medication Sig Start Date End Date Taking? Authorizing Provider  citalopram (CELEXA) 20 MG tablet Take  1 tablet (20 mg total) by mouth daily. 01/20/17  Yes Eustace Moore, MD  clotrimazole-betamethasone (LOTRISONE) cream Apply 1 application topically 2 (two) times daily. 07/03/17  Yes Eustace Moore, MD  hydrochlorothiazide (HYDRODIURIL) 12.5 MG tablet Take 1 tablet (12.5 mg total) by mouth daily. 01/20/17  Yes Kerri Perches, MD  pantoprazole (PROTONIX) 20 MG tablet Take 1 tablet (20 mg total) by mouth daily. For heartburn 06/27/17  Yes Eustace Moore, MD  docusate sodium (COLACE) 100 MG capsule Take 1 capsule (100 mg total) by mouth 2 (two) times daily as needed for mild constipation. Patient not taking: Reported on 07/25/2017 06/27/17   Eustace Moore, MD  sulindac (CLINORIL) 150 MG tablet Take 1 tablet (150 mg total) by mouth 2 (two) times daily. Patient not taking: Reported on 07/25/2017 07/03/17   Eustace Moore, MD  traMADol (ULTRAM) 50 MG tablet Take 1 tablet (50 mg total) by mouth every 6 (six) hours as needed. 07/25/17   [name], MD    Family History Family History  Problem Relation Age of Onset  . Diabetes Mother   . Cancer Brother        throat  . COPD Sister   . Kidney disease Sister   . Diabetes Sister   . Diabetes Sister   .  Kidney disease Sister     Social History Social History  Substance Use Topics  . Smoking status: Never Smoker  . Smokeless tobacco: Never Used  . Alcohol use Yes     Comment: occasional beer, last 4 days ago     Allergies   Patient has no known allergies.   Review of Systems Review of Systems  Constitutional: Negative for appetite change and fatigue.  HENT: Negative for congestion, ear discharge and sinus pressure.   Eyes: Negative for discharge.  Respiratory: Negative for cough.   Cardiovascular: Negative for chest pain.  Gastrointestinal: Negative for abdominal pain and diarrhea.  Genitourinary: Negative for frequency and hematuria.  Musculoskeletal: Negative for back pain.  Skin: Negative for rash.    Neurological: Positive for headaches. Negative for seizures.  Psychiatric/Behavioral: Negative for hallucinations.     Physical Exam Updated Vital Signs BP 121/60   Pulse 73   Temp 98.4 F (36.9 C) (Oral)   Resp 18   Ht  (1.854 m)   Wt 107.5 kg (237 lb)   LMP  (LMP Unknown) Comment: last  one years ago   SpO2 95%   BMI 31.27 kg/m   Physical Exam  Constitutional: She is oriented to person, place, and time. She appears well-developed.  HENT:  Head: Normocephalic.  Eyes: Conjunctivae and EOM are normal. No scleral icterus.  Neck: Neck supple. No thyromegaly present.  Cardiovascular: Normal rate and regular rhythm.  Exam reveals no gallop and no friction rub.   No murmur heard. Pulmonary/Chest: No stridor. She has no wheezes. She has no rales. She exhibits no tenderness.  Abdominal: She exhibits no distension. There is no tenderness. There is no rebound.  Musculoskeletal: Normal range of motion. She exhibits no edema.  Lymphadenopathy:    She has no cervical adenopathy.  Neurological: She is oriented to person, place, and time. She exhibits normal muscle tone. Coordination normal.  Skin: No rash noted. No erythema.  Psychiatric: She has a normal mood and affect. Her behavior is normal.     ED Treatments / Results  Labs (all labs ordered are listed, but only abnormal results are displayed) Labs Reviewed  BASIC METABOLIC PANEL - Abnormal; Notable for the following:       Result Value   Glucose, Bld 117 (*)    All other components within normal limits  CBC WITH DIFFERENTIAL/PLATELET    EKG  EKG Interpretation None       Radiology Ct Head Wo Contrast  Result Date: 07/25/2017 CLINICAL DATA:  Headache with nausea and vomiting EXAM: CT HEAD WITHOUT CONTRAST TECHNIQUE: Contiguous axial images were obtained from the base of the skull through the vertex without intravenous contrast. COMPARISON:  07/15/2015 FINDINGS: Brain: No evidence of acute infarction,  hemorrhage, hydrocephalus, extra-axial collection or mass lesion/mass effect. Vascular: No hyperdense vessel or unexpected calcification. Skull: Normal. Negative for fracture or focal lesion. Sinuses/Orbits: Minimal mucosal thickening in the ethmoid sinuses. No acute orbital abnormality. Other: None IMPRESSION: No CT evidence for acute intracranial abnormality. Electronically Signed   By: Jasmine Pang M.D.   On: 07/25/2017 18:34    Procedures Procedures (including critical care time)  Medications Ordered in ED Medications  ketorolac (TORADOL) 30 MG/ML injection 30 mg (30 mg Intravenous Given 07/25/17 1832)  ondansetron (ZOFRAN) injection 4 mg (4 mg Intravenous Given 07/25/17 1832)  HYDROmorphone (DILAUDID) injection 1 mg (1 mg Intravenous Given 07/25/17 1832)     Initial Impression / Assessment and Plan / ED Course  I  have reviewed the triage vital signs and the nursing notes.  Pertinent labs & imaging results that were available during my care of the patient were reviewed by me and considered in my medical decision making (see chart for details).     Patient with severe headache that improved with treatment. She will be discharged home with a prescription of Ultram will follow-up with her PCP if needed Final Clinical Impressions(s) / ED Diagnoses   Final diagnoses:  Bad headache    New Prescriptions New Prescriptions   TRAMADOL (ULTRAM) 50 MG TABLET    Take 1 tablet (50 mg total) by mouth every 6 (six) hours as needed.     [name], MD 07/25/17 Ebony Cargo

## 2017-07-25 NOTE — Discharge Instructions (Signed)
Follow up with your md if not improving. °

## 2017-10-24 ENCOUNTER — Other Ambulatory Visit: Payer: Self-pay | Admitting: Family Medicine

## 2017-10-24 DIAGNOSIS — Z1231 Encounter for screening mammogram for malignant neoplasm of breast: Secondary | ICD-10-CM

## 2017-11-04 ENCOUNTER — Encounter: Payer: Self-pay | Admitting: Family Medicine

## 2017-11-04 ENCOUNTER — Ambulatory Visit (INDEPENDENT_AMBULATORY_CARE_PROVIDER_SITE_OTHER): Payer: Medicaid Other | Admitting: Family Medicine

## 2017-11-04 ENCOUNTER — Other Ambulatory Visit: Payer: Self-pay

## 2017-11-04 VITALS — BP 136/76 | HR 76 | Temp 97.1°F | Resp 18 | Ht 73.0 in | Wt 240.1 lb

## 2017-11-04 DIAGNOSIS — F4323 Adjustment disorder with mixed anxiety and depressed mood: Secondary | ICD-10-CM | POA: Diagnosis not present

## 2017-11-04 DIAGNOSIS — M170 Bilateral primary osteoarthritis of knee: Secondary | ICD-10-CM | POA: Diagnosis not present

## 2017-11-04 DIAGNOSIS — R1084 Generalized abdominal pain: Secondary | ICD-10-CM | POA: Diagnosis not present

## 2017-11-04 DIAGNOSIS — E559 Vitamin D deficiency, unspecified: Secondary | ICD-10-CM

## 2017-11-04 DIAGNOSIS — I10 Essential (primary) hypertension: Secondary | ICD-10-CM

## 2017-11-04 MED ORDER — CLOTRIMAZOLE-BETAMETHASONE 1-0.05 % EX CREA: 1.0000 "application " | TOPICAL_CREAM | Freq: Two times a day (BID) | CUTANEOUS | 1 refills | Status: DC

## 2017-11-04 NOTE — Progress Notes (Signed)
Chief Complaint  Patient presents with  . Follow-up    4 month   Routine follow up\complinat with BP meds and BP is good. Does not take at home. No headache or dizziness Has arthritis of knee/  Got a injection last visit  It is still pain free.  No crepitus buckling or locking. No falls Mood is stable.  Has a new BF.  Smiles and she discussed.  Compliant with citalopram.  No side effect complains of chronic intermittent abdominal pain.  Food intolerances like milk or spicy food.  Heartburn in spite of protonix.  GB removed. Epigastric.   History vit d deficiency.  Takes supplement  Patient Active Problem List   Diagnosis Date Noted  . Vitamin D deficiency 09/17/2016  . Literacy level of illiterate 09/16/2016  . Mental impairment 09/16/2016  . Colonoscopy refused 09/16/2016  . Elevated liver enzymes 01/11/2015  . Essential hypertension 01/11/2015  . Depression 01/11/2015  . History of cholecystectomy 01/11/2015    Outpatient Encounter Medications as of 11/04/2017  Medication Sig  . citalopram (CELEXA) 20 MG tablet Take 1 tablet (20 mg total) by mouth daily.  . clotrimazole-betamethasone (LOTRISONE) cream Apply 1 application topically 2 (two) times daily.  Marland Kitchen docusate sodium (COLACE) 100 MG capsule Take 1 capsule (100 mg total) by mouth 2 (two) times daily as needed for mild constipation.  . hydrochlorothiazide (HYDRODIURIL) 12.5 MG tablet Take 1 tablet (12.5 mg total) by mouth daily.  . pantoprazole (PROTONIX) 20 MG tablet Take 1 tablet (20 mg total) by mouth daily. For heartburn  . [DISCONTINUED] clotrimazole-betamethasone (LOTRISONE) cream Apply 1 application topically 2 (two) times daily.  . [DISCONTINUED] sulindac (CLINORIL) 150 MG tablet Take 1 tablet (150 mg total) by mouth 2 (two) times daily. (Patient not taking: Reported on 11/04/2017)  . [DISCONTINUED] traMADol (ULTRAM) 50 MG tablet Take 1 tablet (50 mg total) by mouth every 6 (six) hours as needed. (Patient not taking:  Reported on 11/04/2017)   No facility-administered encounter medications on file as of 11/04/2017.     No Known Allergies  Review of Systems  Constitutional: Negative for diaphoresis and fever.  HENT: Negative for congestion and dental problem.   Eyes: Negative.  Negative for visual disturbance.  Respiratory: Negative.  Negative for cough.   Cardiovascular: Negative.  Negative for chest pain and palpitations.  Gastrointestinal: Positive for abdominal pain. Negative for constipation, diarrhea, nausea and vomiting.  Genitourinary: Negative for difficulty urinating, dysuria, hematuria, menstrual problem, vaginal bleeding and vaginal discharge.  Musculoskeletal: Positive for arthralgias and back pain. Negative for gait problem and joint swelling.       Improved  Neurological: Negative for dizziness and headaches.  Psychiatric/Behavioral: Negative.  Negative for self-injury.    BP 136/76 (BP Location: Left Arm, Patient Position: Sitting, Cuff Size: Large)   Pulse 76   Temp (!) 97.1 F (36.2 C) (Temporal)   Resp 18   Ht 6\' 1"  (1.854 m)   Wt 240 lb 1.9 oz (108.9 kg)   LMP  (LMP Unknown) Comment: last  one years ago   SpO2 99%   BMI 31.68 kg/m   Physical Exam  Constitutional: She is oriented to person, place, and time. She appears well-developed and well-nourished.  HENT:  Head: Normocephalic and atraumatic.  Right Ear: External ear normal.  Left Ear: External ear normal.  Mouth/Throat: Oropharynx is clear and moist.  Eyes: Conjunctivae are normal. Pupils are equal, round, and reactive to light.  Neck: Normal range of motion. Neck  supple. No thyromegaly present.  Cardiovascular: Normal rate, regular rhythm and normal heart sounds.  Pulmonary/Chest: Effort normal and breath sounds normal. No respiratory distress.  Abdominal: Soft. Bowel sounds are normal. She exhibits no mass. There is tenderness. There is no rebound and no guarding.  No CVAT.  Mild tenderness deep palpation  epigastrium  Musculoskeletal: Normal range of motion. She exhibits no edema.  Lymphadenopathy:    She has no cervical adenopathy.  Neurological: She is alert and oriented to person, place, and time.  Gait normal  Skin: Skin is warm and dry.  Psychiatric: She has a normal mood and affect. Her behavior is normal. Thought content normal.  Poor fund knowlege  Nursing note and vitals reviewed.   ASSESSMENT/PLAN:  1. Generalized abdominal pain Likely GERD.  2. Essential hypertension controlled - CBC - COMPLETE METABOLIC PANEL WITH GFR - Lipid panel - Urinalysis, Routine w reflex microscopic  3. Vitamin D deficiency history  4. Adjustment disorder with mixed anxiety and depressed mood controlled on  Med  5. OA knee    Patient Instructions  See me for a physical next time No change in medicine I refilled the cream for your rash Need blood work next time See me in 6 months Call sooner for problems   Eustace MooreYvonne Sue Normand Damron, MD

## 2017-11-04 NOTE — Patient Instructions (Signed)
See me for a physical next time No change in medicine I refilled the cream for your rash Need blood work next time See me in 6 months Call sooner for problems

## 2017-11-11 ENCOUNTER — Telehealth: Payer: Self-pay | Admitting: Family Medicine

## 2017-11-11 NOTE — Telephone Encounter (Signed)
Pt is calling cxld appt on Thursday due to transportation, can you call with some advise, with Constipation. Back is hurting, [phone]

## 2017-11-11 NOTE — Telephone Encounter (Signed)
And she has a hemoroid, she cant come at 8:20 no ride

## 2017-11-12 ENCOUNTER — Ambulatory Visit: Payer: Self-pay | Admitting: Family Medicine

## 2017-11-12 NOTE — Telephone Encounter (Signed)
Advise her to add miralax once a day.  Drink more water.

## 2017-11-12 NOTE — Telephone Encounter (Signed)
Patient informed of message below, verbalized understanding.  

## 2017-11-13 ENCOUNTER — Emergency Department (HOSPITAL_COMMUNITY)
Admission: EM | Admit: 2017-11-13 | Discharge: 2017-11-13 | Disposition: A | Discharge status: Home / Self Care | Payer: Medicaid Other | Attending: Emergency Medicine | Admitting: Emergency Medicine

## 2017-11-13 ENCOUNTER — Encounter (HOSPITAL_COMMUNITY): Payer: Self-pay | Admitting: Emergency Medicine

## 2017-11-13 ENCOUNTER — Other Ambulatory Visit: Payer: Self-pay

## 2017-11-13 ENCOUNTER — Emergency Department (HOSPITAL_COMMUNITY): Payer: Medicaid Other

## 2017-11-13 DIAGNOSIS — K5900 Constipation, unspecified: Secondary | ICD-10-CM | POA: Insufficient documentation

## 2017-11-13 DIAGNOSIS — Z79899 Other long term (current) drug therapy: Secondary | ICD-10-CM | POA: Diagnosis not present

## 2017-11-13 DIAGNOSIS — I10 Essential (primary) hypertension: Secondary | ICD-10-CM | POA: Insufficient documentation

## 2017-11-13 MED ORDER — MAGNESIUM CITRATE PO SOLN: 1.0000 | Freq: Once | ORAL | 0 refills | Status: AC

## 2017-11-13 NOTE — ED Notes (Signed)
Pt returned from [facility].

## 2017-11-13 NOTE — Discharge Instructions (Signed)
Try to increase fruits and vegetables in your diet.  Drink plenty of water.  Follow-up with your primary doctor for recheck.

## 2017-11-13 NOTE — ED Triage Notes (Signed)
PT c/o constipation and no BM in three days unrelieved by Miralax prescription ordered yesterday at her PCP office.

## 2017-11-14 NOTE — ED Provider Notes (Signed)
[facility] Provider Note   CSN: [id number] Arrival date & time: 11/13/17  1229     History   Chief Complaint Chief Complaint  Patient presents with  . Constipation    HPI Janet Mcguire is a 57 y.o. female.  HPI  Janet Mcguire is a 57 y.o. female who presents to the Emergency Department complaining of constipation and abdominal fullness for 3 days.  She states that she was seen by her PCP 1 day prior to ER arrival and was prescribed MiraLAX.  She states that she has taken 1 dose of the medication and not had any relief of her symptoms.  She  denies fever, vomiting, new medications and abdominal pain.  States she has some flatulence.  She does admit to a poor diet with minimal to no foods and vegetables or fiber intake.  She has not tried any enemas or other over-the-counter laxatives or medications prior to ER arrival.  No recent abdominal surgeries   Past Medical History:  Diagnosis Date  . Allergy   . Anxiety   . Depression   . Hypertension   . Substance abuse (HCC)    "years ago"    Patient Active Problem List   Diagnosis Date Noted  . Vitamin D deficiency 09/17/2016  . Literacy level of illiterate 09/16/2016  . Mental impairment 09/16/2016  . Colonoscopy refused 09/16/2016  . Elevated liver enzymes 01/11/2015  . Essential hypertension 01/11/2015  . Depression 01/11/2015  . History of cholecystectomy 01/11/2015    Past Surgical History:  Procedure Laterality Date  . CHOLECYSTECTOMY    . ERCP N/A 01/12/2015   Procedure: ENDOSCOPIC RETROGRADE CHOLANGIOPANCREATOGRAPHY (ERCP) ;  Surgeon: Malissa HippoNajeeb U Rehman, MD;  Location: [facility];  Service: Endoscopy;  Laterality: N/A;  Balloon Extraction  . SPHINCTEROTOMY N/A 01/12/2015   Procedure: SPHINCTEROTOMY;  Surgeon: Malissa HippoNajeeb U Rehman, MD;  Location: [facility];  Service: Endoscopy;  Laterality: N/A;  . TUBAL LIGATION      OB History    Gravida Para Term Preterm AB Living   3 1 1   2      SAB TAB Ectopic Multiple  Live Births     2             Home Medications    Prior to Admission medications   Medication Sig Start Date End Date Taking? Authorizing Provider  citalopram (CELEXA) 20 MG tablet Take 1 tablet (20 mg total) by mouth daily. 01/20/17   Eustace MooreNelson, Yvonne Sue, MD  clotrimazole-betamethasone (LOTRISONE) cream Apply 1 application topically 2 (two) times daily. 11/04/17   Eustace MooreNelson, Yvonne Sue, MD  docusate sodium (COLACE) 100 MG capsule Take 1 capsule (100 mg total) by mouth 2 (two) times daily as needed for mild constipation. 06/27/17   Eustace MooreNelson, Yvonne Sue, MD  hydrochlorothiazide (HYDRODIURIL) 12.5 MG tablet Take 1 tablet (12.5 mg total) by mouth daily. 01/20/17   Kerri PerchesSimpson, Margaret E, MD  pantoprazole (PROTONIX) 20 MG tablet Take 1 tablet (20 mg total) by mouth daily. For heartburn 06/27/17   Eustace MooreNelson, Yvonne Sue, MD    Family History Family History  Problem Relation Age of Onset  . Diabetes Mother   . Cancer Brother        throat  . COPD Sister   . Kidney disease Sister   . Diabetes Sister   . Diabetes Sister   . Kidney disease Sister     Social History Social History   Tobacco Use  . Smoking status: Never Smoker  .  Smokeless tobacco: Never Used  Substance Use Topics  . Alcohol use: Yes    Comment: occasional beer, last 4 days ago  . Drug use: Yes    Types: Marijuana    Comment: occasional , last used 4 days ago     Allergies   Patient has no known allergies.   Review of Systems Review of Systems   Physical Exam Updated Vital Signs BP (!) 158/77 (BP Location: Right Arm)   Pulse 81   Temp 98.5 F (36.9 C) (Oral)   Ht 6\' 1"  (1.854 m)   Wt 108.9 kg (240 lb)   LMP  (LMP Unknown) Comment: last  one years ago   SpO2 99%   BMI 31.66 kg/m   Physical Exam  Constitutional: She is oriented to person, place, and time. She appears well-developed and well-nourished. No distress.  HENT:  Head: Normocephalic and atraumatic.  Mouth/Throat: Oropharynx is clear and moist.    Cardiovascular: Normal rate and regular rhythm.  No murmur heard. Pulmonary/Chest: Effort normal and breath sounds normal. No respiratory distress. She exhibits no tenderness.  Abdominal: Soft. Bowel sounds are normal. She exhibits no distension and no mass. There is no tenderness. There is no rebound and no guarding.  Musculoskeletal: Normal range of motion. She exhibits no edema.  Neurological: She is alert and oriented to person, place, and time. She exhibits normal muscle tone. Coordination normal.  Skin: Skin is warm and dry.  Psychiatric: She has a normal mood and affect.  Nursing note and vitals reviewed.    ED Treatments / Results  Labs (all labs ordered are listed, but only abnormal results are displayed) Labs Reviewed - No data to display  EKG  EKG Interpretation None       Radiology Dg Abdomen 1 View  Result Date: 11/13/2017 CLINICAL DATA:  Abdominal pain. The patient reports no bowel movement for 3 days. EXAM: ABDOMEN - 1 VIEW COMPARISON:  Chest and two views abdomen 01/31/2017. FINDINGS: Bowel gas pattern is nonobstructive. There is a large volume of stool throughout the colon. No abnormal abdominal calcification. Multiple surgical clips in the abdomen noted. No acute bony abnormality. IMPRESSION: Large stool burden throughout the colon.  Otherwise negative. Electronically Signed   By: Drusilla Kanner M.D.   On: 11/13/2017 13:52    Procedures Procedures (including critical care time)  Medications Ordered in ED Medications - No data to display   Initial Impression / Assessment and Plan / ED Course  I have reviewed the triage vital signs and the nursing notes.  Pertinent labs & imaging results that were available during my care of the patient were reviewed by me and considered in my medical decision making (see chart for details).     Patient well-appearing.  Ambulates in the department with a steady gait.  Vitals stable, patient afebrile, abdomen is soft and  nontender on exam no concerning symptoms for acute abdomen.  X-ray results discussed with the patient.  Also discussed proper dietary habits.  Prescription written for single dose mag citrate.  She appears stable for discharge home and agrees to treatment plan.  Return precautions discussed  Final Clinical Impressions(s) / ED Diagnoses   Final diagnoses:  Constipation, unspecified constipation type    ED Discharge Orders        Ordered    magnesium citrate SOLN   Once     11/13/17 1420       Pauline Aus, PA-C 11/14/17 2110    Loren Racer, MD 11/22/17  1645  

## 2017-12-01 ENCOUNTER — Ambulatory Visit (HOSPITAL_COMMUNITY)
Admission: RE | Admit: 2017-12-01 | Discharge: 2017-12-01 | Disposition: A | Discharge status: Home / Self Care | Payer: Medicaid Other | Source: Ambulatory Visit | Attending: Family Medicine | Admitting: Family Medicine

## 2017-12-01 ENCOUNTER — Other Ambulatory Visit: Payer: Self-pay

## 2017-12-01 ENCOUNTER — Emergency Department (HOSPITAL_COMMUNITY)
Admission: EM | Admit: 2017-12-01 | Discharge: 2017-12-01 | Disposition: A | Discharge status: Home / Self Care | Payer: Medicaid Other | Attending: Emergency Medicine | Admitting: Emergency Medicine

## 2017-12-01 ENCOUNTER — Emergency Department (HOSPITAL_COMMUNITY)
Admission: EM | Admit: 2017-12-01 | Discharge: 2017-12-01 | Discharge status: Absent without leave | Payer: Medicaid Other

## 2017-12-01 ENCOUNTER — Encounter (HOSPITAL_COMMUNITY): Payer: Self-pay | Admitting: Emergency Medicine

## 2017-12-01 DIAGNOSIS — B353 Tinea pedis: Secondary | ICD-10-CM | POA: Insufficient documentation

## 2017-12-01 DIAGNOSIS — I10 Essential (primary) hypertension: Secondary | ICD-10-CM | POA: Diagnosis not present

## 2017-12-01 DIAGNOSIS — F1210 Cannabis abuse, uncomplicated: Secondary | ICD-10-CM | POA: Insufficient documentation

## 2017-12-01 DIAGNOSIS — R21 Rash and other nonspecific skin eruption: Secondary | ICD-10-CM | POA: Diagnosis present

## 2017-12-01 DIAGNOSIS — Z1231 Encounter for screening mammogram for malignant neoplasm of breast: Secondary | ICD-10-CM | POA: Insufficient documentation

## 2017-12-01 DIAGNOSIS — Z79899 Other long term (current) drug therapy: Secondary | ICD-10-CM | POA: Insufficient documentation

## 2017-12-01 MED ORDER — CLOTRIMAZOLE 1 % EX CREA: TOPICAL_CREAM | CUTANEOUS | 0 refills | Status: DC

## 2017-12-01 NOTE — ED Provider Notes (Signed)
[facility] Provider [facility]   CSN: [id number] Arrival date & time: 12/01/17  1011     History   Chief Complaint Chief Complaint  Patient presents with  . Rash    HPI Janet Mcguire is a 57 y.o. female.  HPI   Janet Mcguire is a 57 y.o. female who presents to the Emergency Department complaining of itching rash to the bottom of her right foot for 1 month.  She describes scaly itchy areas that staying in burn occasionally.  She denies rash to her other foot or up to her body.  She has not tried any over-the-counter medications or therapies prior to arrival.  She denies open wounds, pain, swelling of the foot or numbness.  Nothing makes it better or worse.    Past Medical History:  Diagnosis Date  . Allergy   . Anxiety   . Depression   . Hypertension   . Substance abuse (HCC)    "years ago"    Patient Active Problem List   Diagnosis Date Noted  . Vitamin D deficiency 09/17/2016  . Literacy level of illiterate 09/16/2016  . Mental impairment 09/16/2016  . Colonoscopy refused 09/16/2016  . Elevated liver enzymes 01/11/2015  . Essential hypertension 01/11/2015  . Depression 01/11/2015  . History of cholecystectomy 01/11/2015    Past Surgical History:  Procedure Laterality Date  . CHOLECYSTECTOMY    . ERCP N/A 01/12/2015   Procedure: ENDOSCOPIC RETROGRADE CHOLANGIOPANCREATOGRAPHY (ERCP) ;  Surgeon: Malissa HippoNajeeb U Rehman, MD;  Location: [facility];  Service: Endoscopy;  Laterality: N/A;  Balloon Extraction  . SPHINCTEROTOMY N/A 01/12/2015   Procedure: SPHINCTEROTOMY;  Surgeon: Malissa HippoNajeeb U Rehman, MD;  Location: [facility];  Service: Endoscopy;  Laterality: N/A;  . TUBAL LIGATION      OB History    Gravida Para Term Preterm AB Living   3 1 1   2      SAB TAB Ectopic Multiple Live Births     2             Home Medications    Prior to Admission medications   Medication Sig Start Date End Date Taking? Authorizing Provider  citalopram (CELEXA) 20 MG tablet Take 1  tablet (20 mg total) by mouth daily. 01/20/17   Eustace MooreNelson, Yvonne Sue, MD  clotrimazole (LOTRIMIN) 1 % cream Apply to affected area 2 times daily 12/01/17   Tiaria Biby, PA-C  clotrimazole-betamethasone (LOTRISONE) cream Apply 1 application topically 2 (two) times daily. 11/04/17   Eustace MooreNelson, Yvonne Sue, MD  docusate sodium (COLACE) 100 MG capsule Take 1 capsule (100 mg total) by mouth 2 (two) times daily as needed for mild constipation. 06/27/17   Eustace MooreNelson, Yvonne Sue, MD  hydrochlorothiazide (HYDRODIURIL) 12.5 MG tablet Take 1 tablet (12.5 mg total) by mouth daily. 01/20/17   Kerri PerchesSimpson, Margaret E, MD  pantoprazole (PROTONIX) 20 MG tablet Take 1 tablet (20 mg total) by mouth daily. For heartburn 06/27/17   Eustace MooreNelson, Yvonne Sue, MD    Family History Family History  Problem Relation Age of Onset  . Diabetes Mother   . Cancer Brother        throat  . COPD Sister   . Kidney disease Sister   . Diabetes Sister   . Diabetes Sister   . Kidney disease Sister     Social History Social History   Tobacco Use  . Smoking status: Never Smoker  . Smokeless tobacco: Never Used  Substance Use Topics  . Alcohol use: Yes  Comment: occasional beer, last 4 days ago  . Drug use: Yes    Types: Marijuana    Comment: occasional , last used 4 days ago     Allergies   Patient has no known allergies.   Review of Systems Review of Systems  Constitutional: Negative for activity change, appetite change, chills and fever.  Respiratory: Negative for shortness of breath and wheezing.   Musculoskeletal: Negative for arthralgias, joint swelling and myalgias.  Skin: Positive for rash (Right foot). Negative for wound.  Neurological: Negative for dizziness, weakness, numbness and headaches.  All other systems reviewed and are negative.    Physical Exam Updated Vital Signs BP 131/66 (BP Location: Right Arm)   Pulse 69   Temp 98.5 F (36.9 C) (Oral)   Resp 18   Ht 6\' 1"  (1.854 m)   Wt 108.9 kg (240 lb)   LMP   (LMP Unknown) Comment: last  one years ago   SpO2 99%   BMI 31.66 kg/m   Physical Exam  Constitutional: She is oriented to person, place, and time. She appears well-developed and well-nourished. No distress.  HENT:  Head: Atraumatic.  Cardiovascular: Normal rate, regular rhythm and intact distal pulses.  No murmur heard. Pulmonary/Chest: Effort normal and breath sounds normal. No respiratory distress.  Musculoskeletal: Normal range of motion. She exhibits no edema or tenderness.  Neurological: She is alert and oriented to person, place, and time. No sensory deficit. She exhibits normal muscle tone. Coordination normal.  Skin: Skin is warm. Capillary refill takes less than 2 seconds. Rash noted. No erythema.  Scaly, macular rash to the web spaces and plantar surface of the right foot.  No drainage, edema or erythema.  Psychiatric: She has a normal mood and affect.  Nursing note and vitals reviewed.    ED Treatments / Results  Labs (all labs ordered are listed, but only abnormal results are displayed) Labs Reviewed - No data to display  EKG  EKG Interpretation None       Radiology No results found.  Procedures Procedures (including critical care time)  Medications Ordered in ED Medications - No data to display   Initial Impression / Assessment and Plan / ED Course  I have reviewed the triage vital signs and the nursing notes.  Pertinent labs & imaging results that were available during my care of the patient were reviewed by me and considered in my medical decision making (see chart for details).     Patient well-appearing.  Vitals reviewed.  Chronic appearing rash to the right foot that appears consistent with tinea pedis.  Patient agrees to treatment plan with antifungal.  Final Clinical Impressions(s) / ED Diagnoses   Final diagnoses:  Tinea pedis of right foot    ED Discharge Orders        Ordered    clotrimazole (LOTRIMIN) 1 % cream     12/01/17 1236        Kensie Susman, Babette Relic, PA-C 12/01/17 1249    Samuel Jester, DO 12/03/17 [phone]

## 2017-12-01 NOTE — ED Triage Notes (Signed)
Pt reports rash to bottom of right foot x1 month. Pt denies any known injury, or change in self care products. Pt denies pain.

## 2017-12-01 NOTE — Discharge Instructions (Signed)
Try to keep your feet as dry as possible.  It helps to wear white socks.  Apply the cream as directed after your bath.  Use the cream for at least 2 weeks.  Follow-up with your primary doctor if needed

## 2017-12-29 ENCOUNTER — Encounter: Payer: Self-pay | Admitting: Family Medicine

## 2018-01-09 ENCOUNTER — Ambulatory Visit: Payer: Self-pay | Admitting: Family Medicine

## 2018-01-13 ENCOUNTER — Ambulatory Visit: Payer: Self-pay | Admitting: Family Medicine

## 2018-01-14 ENCOUNTER — Other Ambulatory Visit: Payer: Self-pay

## 2018-01-14 ENCOUNTER — Encounter: Payer: Self-pay | Admitting: Family Medicine

## 2018-01-14 ENCOUNTER — Ambulatory Visit (INDEPENDENT_AMBULATORY_CARE_PROVIDER_SITE_OTHER): Payer: Medicaid Other | Admitting: Family Medicine

## 2018-01-14 VITALS — BP 124/70 | HR 69 | Temp 99.2°F | Resp 12 | Ht 73.0 in | Wt 240.1 lb

## 2018-01-14 DIAGNOSIS — B353 Tinea pedis: Secondary | ICD-10-CM

## 2018-01-14 DIAGNOSIS — M545 Low back pain: Secondary | ICD-10-CM

## 2018-01-14 DIAGNOSIS — G8929 Other chronic pain: Secondary | ICD-10-CM

## 2018-01-14 MED ORDER — KETOCONAZOLE 2 % EX CREA: 1.0000 "application " | TOPICAL_CREAM | Freq: Every day | CUTANEOUS | 0 refills | Status: DC

## 2018-01-14 MED ORDER — CYCLOBENZAPRINE HCL 5 MG PO TABS: 5.0000 mg | ORAL_TABLET | Freq: Three times a day (TID) | ORAL | 0 refills | Status: DC | PRN

## 2018-01-14 MED ORDER — TERBINAFINE HCL 1 % EX CREA: 1.0000 "application " | TOPICAL_CREAM | Freq: Two times a day (BID) | CUTANEOUS | 0 refills | Status: DC

## 2018-01-14 NOTE — Patient Instructions (Addendum)
Use the athlete foot cream as directed- nizoral  Take the flexeril as needed for back pain and spasm  Follow up with new doctor in a couple of months

## 2018-01-14 NOTE — Progress Notes (Signed)
Chief Complaint  Patient presents with  . Back Pain    x 2 days   Janet Mcguire is here for 2 days of back pain.  It hurts in her left flank and low back area.  Hurts worse with movement and is better with rest.  She had trouble moving in bed last night due to the pain.  She took a half of her sisters pain pill and got pain relief.  She states the pain is gone today.  She had no pain that radiates into the hips or legs.  No numbness or weakness.  No falls or injury.  She does not recall any change in her activity or heavy lifting. She also has a "rash" on her feet.  She has been seen by another doctor.  They gave her a prescription for Lotrimin.  This did not cleared up.  She would like a different prescription.  Patient Active Problem List   Diagnosis Date Noted  . Vitamin D deficiency 09/17/2016  . Literacy level of illiterate 09/16/2016  . Mental impairment 09/16/2016  . Colonoscopy refused 09/16/2016  . Elevated liver enzymes 01/11/2015  . Essential hypertension 01/11/2015  . Depression 01/11/2015  . History of cholecystectomy 01/11/2015    Outpatient Encounter Medications as of 01/14/2018  Medication Sig  . citalopram (CELEXA) 20 MG tablet Take 1 tablet (20 mg total) by mouth daily.  Marland Kitchen docusate sodium (COLACE) 100 MG capsule Take 1 capsule (100 mg total) by mouth 2 (two) times daily as needed for mild constipation.  . hydrochlorothiazide (HYDRODIURIL) 12.5 MG tablet Take 1 tablet (12.5 mg total) by mouth daily.  . pantoprazole (PROTONIX) 20 MG tablet Take 1 tablet (20 mg total) by mouth daily. For heartburn  . cyclobenzaprine (FLEXERIL) 5 MG tablet Take 1 tablet (5 mg total) by mouth 3 (three) times daily as needed for muscle spasms.  Marland Kitchen ketoconazole (NIZORAL) 2 % cream Apply 1 application topically daily.   No facility-administered encounter medications on file as of 01/14/2018.     No Known Allergies  Review of Systems  Constitutional: Negative for diaphoresis and fever.  HENT:  Negative for congestion and dental problem.   Eyes: Negative.  Negative for visual disturbance.  Respiratory: Negative.  Negative for cough.   Cardiovascular: Negative.  Negative for chest pain and palpitations.  Gastrointestinal: Negative for abdominal pain, constipation, diarrhea, nausea and vomiting.  Genitourinary: Negative for difficulty urinating, dysuria, hematuria, menstrual problem, vaginal bleeding and vaginal discharge.  Musculoskeletal: Positive for arthralgias and back pain. Negative for gait problem and joint swelling.       Improved  Skin: Positive for rash.  Neurological: Negative for dizziness and headaches.  Psychiatric/Behavioral: Positive for dysphoric mood. Negative for self-injury.    BP 124/70   Pulse 69   Temp 99.2 F (37.3 C)   Resp 12   Ht 6\' 1"  (1.854 m)   Wt 240 lb 1.9 oz (108.9 kg)   LMP  (LMP Unknown) Comment: last  one years ago   SpO2 97%   BMI 31.68 kg/m   Physical Exam  Constitutional: She appears well-developed and well-nourished.  No apparent discomfort.  HENT:  Head: Normocephalic and atraumatic.  Mouth/Throat: Oropharynx is clear and moist.  Eyes: Pupils are equal, round, and reactive to light. Conjunctivae are normal.  Neck: Normal range of motion.  Cardiovascular: Normal rate, regular rhythm and normal heart sounds.  Pulmonary/Chest: Effort normal and breath sounds normal.  Musculoskeletal:  Back is straight and nontender.  Full motion.  Strength sensation range of motion and reflexes are normal in both lower extremities  Lymphadenopathy:    She has no cervical adenopathy.  Skin: Skin is warm and dry. No rash noted.  Rash on both feet between toes and into plantar aspect, some vesicles, some hyperkeratotic.  No erythema.  Slight peeling.  Psychiatric: She has a normal mood and affect. Her behavior is normal.  States she feels depressed at time, occasionally tearful.  Feels she lacks family support.  Declines referral to counseling.     ASSESSMENT/PLAN:  1. Tinea pedis of both feet We will treat with ketoconazole  2. Chronic bilateral low back pain without sciatica Discussed stretching, exercising.  Discussed daily walking.  She can take Tylenol as needed for pain.  Discouraged from taking other people's pain medication.  Limited number of Flexeril to take as needed at night when back is painful.   Patient Instructions  Use the athlete foot cream as directed- nizoral  Take the flexeril as needed for back pain and spasm  Follow up with new doctor in a couple of months   Janet MooreYvonne Sue Salmaan Patchin, MD

## 2018-01-22 ENCOUNTER — Other Ambulatory Visit: Payer: Self-pay | Admitting: Family Medicine

## 2018-01-27 ENCOUNTER — Telehealth: Payer: Self-pay | Admitting: Family Medicine

## 2018-01-27 ENCOUNTER — Other Ambulatory Visit: Payer: Self-pay

## 2018-01-27 MED ORDER — HYDROCHLOROTHIAZIDE 12.5 MG PO TABS: 12.5000 mg | ORAL_TABLET | Freq: Every day | ORAL | 0 refills | Status: DC

## 2018-01-27 MED ORDER — CITALOPRAM HYDROBROMIDE 20 MG PO TABS: 20.0000 mg | ORAL_TABLET | Freq: Every evening | ORAL | 0 refills | Status: DC

## 2018-01-27 NOTE — Telephone Encounter (Signed)
Patient called in because she states her new doctor said that patient needs to have doctor nelson refill her medications until June. Medications are hydrochlorothiazide and citalopram   [facility]

## 2018-01-27 NOTE — Telephone Encounter (Signed)
Refills sent in and patient notified. 

## 2018-03-04 ENCOUNTER — Other Ambulatory Visit: Payer: Self-pay

## 2018-03-04 ENCOUNTER — Emergency Department (HOSPITAL_COMMUNITY): Payer: Medicaid Other

## 2018-03-04 ENCOUNTER — Encounter (HOSPITAL_COMMUNITY): Payer: Self-pay | Admitting: Emergency Medicine

## 2018-03-04 ENCOUNTER — Emergency Department (HOSPITAL_COMMUNITY)
Admission: EM | Admit: 2018-03-04 | Discharge: 2018-03-04 | Disposition: A | Discharge status: Home / Self Care | Payer: Medicaid Other | Attending: Emergency Medicine | Admitting: Emergency Medicine

## 2018-03-04 DIAGNOSIS — R1084 Generalized abdominal pain: Secondary | ICD-10-CM | POA: Diagnosis present

## 2018-03-04 DIAGNOSIS — R112 Nausea with vomiting, unspecified: Secondary | ICD-10-CM | POA: Diagnosis not present

## 2018-03-04 DIAGNOSIS — Z79899 Other long term (current) drug therapy: Secondary | ICD-10-CM | POA: Insufficient documentation

## 2018-03-04 DIAGNOSIS — E876 Hypokalemia: Secondary | ICD-10-CM | POA: Diagnosis not present

## 2018-03-04 DIAGNOSIS — I10 Essential (primary) hypertension: Secondary | ICD-10-CM | POA: Insufficient documentation

## 2018-03-04 DIAGNOSIS — R197 Diarrhea, unspecified: Secondary | ICD-10-CM

## 2018-03-04 LAB — CBC
HCT: 39.3 % (ref 36.0–46.0)
HEMOGLOBIN: 12.4 g/dL (ref 12.0–15.0)
MCH: 28.4 pg (ref 26.0–34.0)
MCHC: 31.6 g/dL (ref 30.0–36.0)
MCV: 89.9 fL (ref 78.0–100.0)
Platelets: 324 10*3/uL (ref 150–400)
RBC: 4.37 MIL/uL (ref 3.87–5.11)
RDW: 15.2 % (ref 11.5–15.5)
WBC: 9.4 10*3/uL (ref 4.0–10.5)

## 2018-03-04 LAB — COMPREHENSIVE METABOLIC PANEL
ALK PHOS: 76 U/L (ref 38–126)
ALT: 18 U/L (ref 14–54)
ANION GAP: 8 (ref 5–15)
AST: 18 U/L (ref 15–41)
Albumin: 4 g/dL (ref 3.5–5.0)
BILIRUBIN TOTAL: 0.7 mg/dL (ref 0.3–1.2)
BUN: 14 mg/dL (ref 6–20)
CALCIUM: 9.3 mg/dL (ref 8.9–10.3)
CO2: 26 mmol/L (ref 22–32)
Chloride: 103 mmol/L (ref 101–111)
Creatinine, Ser: 0.88 mg/dL (ref 0.44–1.00)
Glucose, Bld: 122 mg/dL — ABNORMAL HIGH (ref 65–99)
Potassium: 3.2 mmol/L — ABNORMAL LOW (ref 3.5–5.1)
SODIUM: 137 mmol/L (ref 135–145)
TOTAL PROTEIN: 8.1 g/dL (ref 6.5–8.1)

## 2018-03-04 LAB — URINALYSIS, ROUTINE W REFLEX MICROSCOPIC
Bilirubin Urine: NEGATIVE
GLUCOSE, UA: NEGATIVE mg/dL
Ketones, ur: NEGATIVE mg/dL
Leukocytes, UA: NEGATIVE
NITRITE: NEGATIVE
PROTEIN: 100 mg/dL — AB
Specific Gravity, Urine: 1.015 (ref 1.005–1.030)
pH: 6 (ref 5.0–8.0)

## 2018-03-04 LAB — POC OCCULT BLOOD, ED: Fecal Occult Bld: NEGATIVE

## 2018-03-04 LAB — LIPASE, BLOOD: LIPASE: 29 U/L (ref 11–51)

## 2018-03-04 MED ORDER — ONDANSETRON HCL 4 MG/2ML IJ SOLN
4.0000 mg | Freq: Once | INTRAMUSCULAR | Status: AC
  Administered 2018-03-04: 4 mg via INTRAVENOUS
  Filled 2018-03-04: qty 2

## 2018-03-04 MED ORDER — SODIUM CHLORIDE 0.9 % IV BOLUS
1000.0000 mL | Freq: Once | INTRAVENOUS | Status: AC
  Administered 2018-03-04: 1000 mL via INTRAVENOUS

## 2018-03-04 MED ORDER — POTASSIUM CHLORIDE CRYS ER 20 MEQ PO TBCR
40.0000 meq | EXTENDED_RELEASE_TABLET | Freq: Once | ORAL | Status: AC
  Administered 2018-03-04: 40 meq via ORAL
  Filled 2018-03-04: qty 2

## 2018-03-04 MED ORDER — ONDANSETRON 4 MG PO TBDP: 4.0000 mg | ORAL_TABLET | Freq: Three times a day (TID) | ORAL | 0 refills | Status: DC | PRN

## 2018-03-04 MED ORDER — IOPAMIDOL (ISOVUE-300) INJECTION 61%
100.0000 mL | Freq: Once | INTRAVENOUS | Status: AC | PRN
  Administered 2018-03-04: 100 mL via INTRAVENOUS

## 2018-03-04 MED ORDER — DICYCLOMINE HCL 20 MG PO TABS: 20.0000 mg | ORAL_TABLET | Freq: Two times a day (BID) | ORAL | 0 refills | Status: DC

## 2018-03-04 NOTE — Discharge Instructions (Addendum)
As discussed, make sure that you stay well hydrated. Take Zofran as needed for nausea vomiting.  Bentyl as needed for diarrhea. Start reintroducing food slowly beginning with broth, soups and bland foods. Follow up with your primary care provider or the [facility] to establish care with a Primary Care provider. Follow up with gastroenterology as needed if symptoms persist beyond a week.  Return if symptoms worsen or new concerning symptoms in the meantime.

## 2018-03-04 NOTE — ED Provider Notes (Signed)
[facility] EMERGENCY DEPARTMENT Provider Note   CSN: [id number] Arrival date & time: 03/04/18  1532     History   Chief Complaint Chief Complaint  Patient presents with  . Abdominal Pain    HPI Janet Mcguire is a 57 y.o. female with past medical history significant for anxiety/depression, hypertension presenting with 2 weeks of left-sided abdominal discomfort she states that it has been waxing and waning and she has been experiencing nausea vomiting with this.  He also reports 2 days of waxing and waning nonbloody diarrhea.  She also states that she is having what she thinks might be a hemorrhoid or some irritation near her anus especially when she uses the bathroom.  She is having difficulty describing her symptoms clearly.  She denies any rectal bleeding, rectal pain, melena, fever, chills or other symptoms.  Patient has not tried anything for her symptoms.  Patient's past surgical history includes cholecystectomy. Patient is postmenopausal.  HPI  Past Medical History:  Diagnosis Date  . Allergy   . Anxiety   . Depression   . Hypertension   . Substance abuse (HCC)    "years ago"    Patient Active Problem List   Diagnosis Date Noted  . Vitamin D deficiency 09/17/2016  . Literacy level of illiterate 09/16/2016  . Mental impairment 09/16/2016  . Colonoscopy refused 09/16/2016  . Elevated liver enzymes 01/11/2015  . Essential hypertension 01/11/2015  . Depression 01/11/2015  . History of cholecystectomy 01/11/2015    Past Surgical History:  Procedure Laterality Date  . CHOLECYSTECTOMY    . ERCP N/A 01/12/2015   Procedure: ENDOSCOPIC RETROGRADE CHOLANGIOPANCREATOGRAPHY (ERCP) ;  Surgeon: Malissa Hippo, MD;  Location: [facility];  Service: Endoscopy;  Laterality: N/A;  Balloon Extraction  . SPHINCTEROTOMY N/A 01/12/2015   Procedure: SPHINCTEROTOMY;  Surgeon: Malissa Hippo, MD;  Location: [facility];  Service: Endoscopy;  Laterality: N/A;  . TUBAL LIGATION       OB History     Gravida  3   Para  1   Term  1   Preterm      AB  2   Living        SAB      TAB  2   Ectopic      Multiple      Live Births               Home Medications    Prior to Admission medications   Medication Sig Start Date End Date Taking? Authorizing Provider  citalopram (CELEXA) 20 MG tablet Take 1 tablet (20 mg total) by mouth every evening. 01/27/18   Eustace Moore, MD  cyclobenzaprine (FLEXERIL) 5 MG tablet Take 1 tablet (5 mg total) by mouth 3 (three) times daily as needed for muscle spasms. 01/14/18   Eustace Moore, MD  dicyclomine (BENTYL) 20 MG tablet Take 1 tablet (20 mg total) by mouth 2 (two) times daily. 03/04/18   Mathews Robinsons B, PA-C  docusate sodium (COLACE) 100 MG capsule Take 1 capsule (100 mg total) by mouth 2 (two) times daily as needed for mild constipation. 06/27/17   Eustace Moore, MD  hydrochlorothiazide (HYDRODIURIL) 12.5 MG tablet Take 1 tablet (12.5 mg total) by mouth daily. 01/27/18   Eustace Moore, MD  ketoconazole (NIZORAL) 2 % cream Apply 1 application topically daily. 01/14/18   Eustace Moore, MD  ondansetron (ZOFRAN ODT) 4 MG disintegrating tablet Take 1 tablet (4 mg total) by mouth every  8 (eight) hours as needed for nausea or vomiting. 03/04/18   Mathews Robinsons B, PA-C  pantoprazole (PROTONIX) 20 MG tablet TAKE 1 TABLET BY MOUTH ONCE DAILY. 01/22/18   Eustace Moore, MD    Family History Family History  Problem Relation Age of Onset  . Diabetes Mother   . Cancer Brother        throat  . COPD Sister   . Kidney disease Sister   . Diabetes Sister   . Diabetes Sister   . Kidney disease Sister     Social History Social History   Tobacco Use  . Smoking status: Never Smoker  . Smokeless tobacco: Never Used  Substance Use Topics  . Alcohol use: Yes    Comment: occasional beer, last 4 days ago  . Drug use: Yes    Types: Marijuana    Comment: 03/01/17     Allergies   Patient has no known  allergies.   Review of Systems Review of Systems  Constitutional: Negative for chills, diaphoresis, fatigue and fever.  Respiratory: Negative for shortness of breath.   Cardiovascular: Negative for chest pain.  Gastrointestinal: Positive for abdominal pain, diarrhea, nausea and vomiting. Negative for blood in stool and rectal pain.  Genitourinary: Positive for flank pain. Negative for decreased urine volume, difficulty urinating, dysuria, hematuria, pelvic pain, urgency and vaginal bleeding.  Musculoskeletal: Negative for myalgias, neck pain and neck stiffness.  Skin: Negative for color change, pallor and rash.  Neurological: Negative for dizziness, light-headedness and headaches.     Physical Exam Updated Vital Signs BP (!) 151/64 (BP Location: Right Arm)   Pulse 71   Temp 98.5 F (36.9 C) (Oral)   Resp 16   Ht  (1.854 m)   Wt 109.5 kg (241 lb 6.4 oz)   LMP  (LMP Unknown) Comment: last  one years ago   SpO2 100%   BMI 31.85 kg/m   Physical Exam  Constitutional: She appears well-developed and well-nourished.  Non-toxic appearance. She does not appear ill. No distress.  Afebrile, nontoxic-appearing, lying comfortably in bed no acute distress.  HENT:  Head: Normocephalic and atraumatic.  Eyes: Conjunctivae and EOM are normal.  Neck: Neck supple.  Cardiovascular: Normal rate, regular rhythm and normal heart sounds.  No murmur heard. Pulmonary/Chest: Effort normal and breath sounds normal. No stridor. No respiratory distress. She has no wheezes. She has no rhonchi. She has no rales.  Abdominal: Soft. Normal appearance and bowel sounds are normal. There is tenderness in the epigastric area, left upper quadrant and left lower quadrant. There is no rigidity, no rebound, no guarding, no CVA tenderness, no tenderness at McBurney's point and negative Murphy's sign.  Genitourinary: Rectum normal. Rectal exam shows no mass, no tenderness and guaiac negative stool.  Genitourinary  Comments: Rectal exam normal  Musculoskeletal: She exhibits no edema.  Neurological: She is alert.  Skin: Skin is warm and dry. No rash noted. She is not diaphoretic. No cyanosis or erythema.  Psychiatric: She has a normal mood and affect.  Nursing note and vitals reviewed.    ED Treatments / Results  Labs (all labs ordered are listed, but only abnormal results are displayed) Labs Reviewed  COMPREHENSIVE METABOLIC PANEL - Abnormal; Notable for the following components:      Result Value   Potassium 3.2 (*)    Glucose, Bld 122 (*)    All other components within normal limits  URINALYSIS, ROUTINE W REFLEX MICROSCOPIC - Abnormal; Notable for the following components:  Hgb urine dipstick MODERATE (*)    Protein, ur 100 (*)    Bacteria, UA RARE (*)    All other components within normal limits  LIPASE, BLOOD  CBC  OCCULT BLOOD X 1 CARD TO LAB, STOOL  POC OCCULT BLOOD, ED    EKG None  Radiology Ct Abdomen Pelvis W Contrast  Result Date: 03/04/2018 CLINICAL DATA:  Abdominal pain, vomiting and diarrhea EXAM: CT ABDOMEN AND PELVIS WITH CONTRAST TECHNIQUE: Multidetector CT imaging of the abdomen and pelvis was performed using the standard protocol following bolus administration of intravenous contrast. CONTRAST:  ISOVUE-300 IOPAMIDOL (ISOVUE-300) INJECTION 61% COMPARISON:  01/11/2015 FINDINGS: Lower chest: No acute abnormality. Hepatobiliary: The liver appears normal. The gallbladder has been removed. Since the prior CT, intrahepatic and extrahepatic biliary ductal dilatation has resolved. Pancreas: Unremarkable. No pancreatic ductal dilatation or surrounding inflammatory changes. Spleen: Normal in size without focal abnormality. Adrenals/Urinary Tract: Adrenal glands are unremarkable. Kidneys are normal, without renal calculi, focal lesion, or hydronephrosis. Bladder is unremarkable. Stomach/Bowel: There is a small hiatal hernia. Bowel shows no evidence of obstruction, inflammation or  lesion. No free air identified. The appendix is normal. Vascular/Lymphatic: No significant vascular findings are present. No enlarged abdominal or pelvic lymph nodes. Reproductive: Uterus and bilateral adnexa are unremarkable. Other: No abdominal wall hernia or abnormality. No abdominopelvic ascites. Musculoskeletal: No acute or significant osseous findings. IMPRESSION: No acute findings in the abdomen or pelvis. Resolution of biliary dilatation since the prior CT in 2016. Small hiatal hernia. Electronically Signed   By: Irish Lack M.D.   On: 03/04/2018 20:42    Procedures Procedures (including critical care time)  Medications Ordered in ED Medications  sodium chloride 0.9 % bolus 1,000 mL (0 mLs Intravenous Stopped 03/04/18 2016)  ondansetron (ZOFRAN) injection 4 mg (4 mg Intravenous Given 03/04/18 1919)  potassium chloride SA (K-DUR,KLOR-CON) CR tablet 40 mEq (40 mEq Oral Given 03/04/18 1919)  iopamidol (ISOVUE-300) 61 % injection 100 mL (100 mLs Intravenous Contrast Given 03/04/18 1950)     Initial Impression / Assessment and Plan / ED Course  I have reviewed the triage vital signs and the nursing notes.  Pertinent labs & imaging results that were available during my care of the patient were reviewed by me and considered in my medical decision making (see chart for details).    Patient presenting with 2 weeks of waxing and waning left-sided abdominal discomfort with associated nausea vomiting and 2 days of nonbloody diarrhea and rectal irritation.  On exam she is tender to deep palpation of the left lower quadrant. Rectal exam without hemorrhoids, bleeding, fissure or other abnormalities.  Labs unremarkable except for hypokalemia CT abdomen and pelvis negative for acute abnormality.  Patient significantly improved while in the emergency department.  She was given IV fluids, antiemetic and potassium supplement.  No vomiting while in the emergency department.  Patient is tolerating  p.o.  Discharge home with symptomatic relief and close follow-up with PCP and gastroenterology as needed.  Discussed strict return precautions and advised to return to the emergency department if experiencing any new or worsening symptoms. Instructions were understood and patient agreed with discharge plan.  Final Clinical Impressions(s) / ED Diagnoses   Final diagnoses:  Generalized abdominal pain  Hypokalemia  Nausea vomiting and diarrhea    ED Discharge Orders        Ordered    ondansetron (ZOFRAN ODT) 4 MG disintegrating tablet  Every 8 hours PRN     03/04/18 2048  dicyclomine (BENTYL) 20 MG tablet  2 times daily     03/04/18 2048       Gregary Cromer 03/04/18 2125    Terrilee Files, MD 03/06/18 1100

## 2018-03-04 NOTE — ED Triage Notes (Signed)
Pt reports abd pain for several weeks, has not been seen for it. V/ X2 and D/ X2 without blood. Able to tolerate food and fluids.

## 2018-03-29 NOTE — Congregational Nurse Program (Signed)
Congregational Nurse Program Note  Date of Encounter: 03/29/2018  Past Medical History: Past Medical History:  Diagnosis Date  . Allergy   . Anxiety   . Depression   . Hypertension   . Substance abuse (HCC)    "years ago"    Encounter Details: CNP Questionnaire - 03/04/18 2331      Questionnaire   Patient Status  Not Applicable    Race  Black or African American    Location Patient Served At  [employer] [place]    Insurance  Medicaid    Uninsured  Not Applicable    Food  No food insecurities    Housing/Utilities  Yes, have permanent housing    Transportation  No transportation needs    Interpersonal Safety  Yes, feel physically and emotionally safe where you currently live    Medication  No medication insecurities    Medical Provider  Yes    Referrals  Not Applicable    ED Visit Averted  Not Applicable    Life-Saving Intervention Made  Not Applicable       -[facility] [employer] [employer] [facility] -P  62 Discussed importance of taking medication, eating proper diet, and getting exercise. Jenene SlickerEmma Ryver Poblete RN, [place] [facility] [phone]

## 2018-06-04 ENCOUNTER — Encounter (HOSPITAL_COMMUNITY): Payer: Self-pay | Admitting: Emergency Medicine

## 2018-06-04 ENCOUNTER — Emergency Department (HOSPITAL_COMMUNITY)
Admission: EM | Admit: 2018-06-04 | Discharge: 2018-06-04 | Disposition: A | Discharge status: Home / Self Care | Payer: Medicaid Other | Attending: Emergency Medicine | Admitting: Emergency Medicine

## 2018-06-04 ENCOUNTER — Other Ambulatory Visit: Payer: Self-pay

## 2018-06-04 DIAGNOSIS — I10 Essential (primary) hypertension: Secondary | ICD-10-CM | POA: Insufficient documentation

## 2018-06-04 DIAGNOSIS — K6289 Other specified diseases of anus and rectum: Secondary | ICD-10-CM | POA: Insufficient documentation

## 2018-06-04 DIAGNOSIS — Z79899 Other long term (current) drug therapy: Secondary | ICD-10-CM | POA: Diagnosis not present

## 2018-06-04 NOTE — ED Provider Notes (Signed)
[facility] EMERGENCY DEPARTMENT Provider Note   CSN: [id number] Arrival date & time: 06/04/18  0645     History   Chief Complaint Chief Complaint  Patient presents with  . Rectal Pain    HPI Janet Mcguire is a 57 y.o. female.  HPI  The patient is a 57 year old female, she presents with a complaint of rectal pain although she states it is not really a pain it is just an abnormal feeling.  She states it is not itchy, she has occasional diarrhea but mostly normal bowel movements, she has had weight gain, not weight loss.  This is persistent over the last month but seem to get worse overnight hence her visit today.  She does not have pain with bowel movements and has no blood in her stools.  She was already scheduled to see Dr. Sudie BaileyKnowlton tomorrow for this complaint.  Specifically the patient denies dysuria, diarrhea, fever, chills, hematuria or any other gastrointestinal symptoms including nausea or vomiting.   Past Medical History:  Diagnosis Date  . Allergy   . Anxiety   . Depression   . Hypertension   . Substance abuse (HCC)    "years ago"    Patient Active Problem List   Diagnosis Date Noted  . Vitamin D deficiency 09/17/2016  . Literacy level of illiterate 09/16/2016  . Mental impairment 09/16/2016  . Colonoscopy refused 09/16/2016  . Elevated liver enzymes 01/11/2015  . Essential hypertension 01/11/2015  . Depression 01/11/2015  . History of cholecystectomy 01/11/2015    Past Surgical History:  Procedure Laterality Date  . CHOLECYSTECTOMY    . ERCP N/A 01/12/2015   Procedure: ENDOSCOPIC RETROGRADE CHOLANGIOPANCREATOGRAPHY (ERCP) ;  Surgeon: Malissa HippoNajeeb U Rehman, MD;  Location: [facility];  Service: Endoscopy;  Laterality: N/A;  Balloon Extraction  . SPHINCTEROTOMY N/A 01/12/2015   Procedure: SPHINCTEROTOMY;  Surgeon: Malissa HippoNajeeb U Rehman, MD;  Location: [facility];  Service: Endoscopy;  Laterality: N/A;  . TUBAL LIGATION       OB History    Gravida  3   Para  1   Term  1   Preterm      AB  2   Living        SAB      TAB  2   Ectopic      Multiple      Live Births               Home Medications    Prior to Admission medications   Medication Sig Start Date End Date Taking? Authorizing Provider  citalopram (CELEXA) 20 MG tablet Take 1 tablet (20 mg total) by mouth every evening. 01/27/18   Eustace MooreNelson, Yvonne Sue, MD  cyclobenzaprine (FLEXERIL) 5 MG tablet Take 1 tablet (5 mg total) by mouth 3 (three) times daily as needed for muscle spasms. 01/14/18   Eustace MooreNelson, Yvonne Sue, MD  dicyclomine (BENTYL) 20 MG tablet Take 1 tablet (20 mg total) by mouth 2 (two) times daily. 03/04/18   Mathews RobinsonsMitchell, Jessica B, PA-C  docusate sodium (COLACE) 100 MG capsule Take 1 capsule (100 mg total) by mouth 2 (two) times daily as needed for mild constipation. 06/27/17   Eustace MooreNelson, Yvonne Sue, MD  hydrochlorothiazide (HYDRODIURIL) 12.5 MG tablet Take 1 tablet (12.5 mg total) by mouth daily. 01/27/18   Eustace MooreNelson, Yvonne Sue, MD  ketoconazole (NIZORAL) 2 % cream Apply 1 application topically daily. 01/14/18   Eustace MooreNelson, Yvonne Sue, MD  ondansetron (ZOFRAN ODT) 4 MG disintegrating tablet Take 1 tablet (4  mg total) by mouth every 8 (eight) hours as needed for nausea or vomiting. 03/04/18   Mathews Robinsons B, PA-C  pantoprazole (PROTONIX) 20 MG tablet TAKE 1 TABLET BY MOUTH ONCE DAILY. 01/22/18   Eustace Moore, MD    Family History Family History  Problem Relation Age of Onset  . Diabetes Mother   . Cancer Brother        throat  . COPD Sister   . Kidney disease Sister   . Diabetes Sister   . Diabetes Sister   . Kidney disease Sister     Social History Social History   Tobacco Use  . Smoking status: Never Smoker  . Smokeless tobacco: Never Used  Substance Use Topics  . Alcohol use: Yes    Comment: occasional beer, last 4 days ago  . Drug use: Yes    Types: Marijuana    Comment: 03/01/17     Allergies   Patient has no known allergies.   Review of Systems Review of Systems    All other systems reviewed and are negative.    Physical Exam Updated Vital Signs BP (!) 142/75   Pulse 74   Temp 98.1 F (36.7 C) (Oral)   Resp 18   Ht 6\' 1"  (1.854 m)   Wt 109.3 kg   LMP  (LMP Unknown) Comment: last  one years ago   SpO2 100%   BMI 31.80 kg/m   Physical Exam  Constitutional: She appears well-developed and well-nourished. No distress.  HENT:  Head: Normocephalic and atraumatic.  Mouth/Throat: Oropharynx is clear and moist. No oropharyngeal exudate.  Eyes: Pupils are equal, round, and reactive to light. Conjunctivae and EOM are normal. Right eye exhibits no discharge. Left eye exhibits no discharge. No scleral icterus.  Neck: Normal range of motion. Neck supple. No JVD present. No thyromegaly present.  Cardiovascular: Normal rate, regular rhythm, normal heart sounds and intact distal pulses. Exam reveals no gallop and no friction rub.  No murmur heard. Pulmonary/Chest: Effort normal and breath sounds normal. No respiratory distress. She has no wheezes. She has no rales.  Abdominal: Soft. Bowel sounds are normal. She exhibits no distension and no mass. There is no tenderness.  Obese but soft and totally nontender  Genitourinary:  Genitourinary Comments: Chaperone present for exam, normal-appearing anal tissue, rectum is nontender with no masses, no fissures, no blood in the vault, brown stool present.  There is no internal tenderness to palpation either.  Musculoskeletal: Normal range of motion. She exhibits no edema or tenderness.  Lymphadenopathy:    She has no cervical adenopathy.  Neurological: She is alert. Coordination normal.  Skin: Skin is warm and dry. No rash noted. No erythema.  Psychiatric: She has a normal mood and affect. Her behavior is normal.  Nursing note and vitals reviewed.    ED Treatments / Results  Labs (all labs ordered are listed, but only abnormal results are displayed) Labs Reviewed - No data to  display  EKG None  Radiology No results found.  Procedures Procedures (including critical care time)  Medications Ordered in ED Medications - No data to display   Initial Impression / Assessment and Plan / ED Course  I have reviewed the triage vital signs and the nursing notes.  Pertinent labs & imaging results that were available during my care of the patient were reviewed by me and considered in my medical decision making (see chart for details).    The patient has no signs of a perirectal  abscess, no fever, no abdominal tenderness, normal-appearing rectal exam.  She can follow-up as per her usual tomorrow with her primary doctor, she may need a colonoscopy but given the CT scan less than 3 months ago which was normal I doubt that this is anything more severe or significant.  Vitals:   06/04/18 0657 06/04/18 0659 06/04/18 0700  BP:  (!) 142/75 (!) 142/75  Pulse:  72 74  Resp:  18   Temp:  98.1 F (36.7 C)   TempSrc:  Oral   SpO2:  100% 100%  Weight: 109.3 kg    Height: 6\' 1"  (1.854 m)       Final Clinical Impressions(s) / ED Diagnoses   Final diagnoses:  Rectal pain      Eber HongMiller, Charlett Merkle, MD 06/04/18 [phone]

## 2018-06-04 NOTE — ED Triage Notes (Signed)
Complaints of rectal pain x 1 month getting worse. States she had some diarrhea yesterday.

## 2018-06-04 NOTE — Discharge Instructions (Signed)
Follow-up with Dr. Sudie BaileyKnowlton tomorrow at your appointment.  Your exam was reassuring in the CAT scan that you had in May was normal.  Return for severe rectal bleeding, worsening pain or fevers

## 2018-07-08 ENCOUNTER — Telehealth (INDEPENDENT_AMBULATORY_CARE_PROVIDER_SITE_OTHER): Payer: Self-pay | Admitting: *Deleted

## 2018-07-08 ENCOUNTER — Encounter (INDEPENDENT_AMBULATORY_CARE_PROVIDER_SITE_OTHER): Payer: Self-pay | Admitting: *Deleted

## 2018-07-08 ENCOUNTER — Ambulatory Visit (INDEPENDENT_AMBULATORY_CARE_PROVIDER_SITE_OTHER): Payer: Medicaid Other | Admitting: Internal Medicine

## 2018-07-08 ENCOUNTER — Encounter (INDEPENDENT_AMBULATORY_CARE_PROVIDER_SITE_OTHER): Payer: Self-pay | Admitting: Internal Medicine

## 2018-07-08 VITALS — BP 138/80 | HR 83 | Ht 73.0 in | Wt 240.0 lb

## 2018-07-08 DIAGNOSIS — K625 Hemorrhage of anus and rectum: Secondary | ICD-10-CM

## 2018-07-08 DIAGNOSIS — K5909 Other constipation: Secondary | ICD-10-CM

## 2018-07-08 HISTORY — DX: Hemorrhage of anus and rectum: K62.5

## 2018-07-08 MED ORDER — SUPREP BOWEL PREP KIT 17.5-3.13-1.6 GM/177ML PO SOLN: 1.0000 | Freq: Once | ORAL | 0 refills | Status: AC

## 2018-07-08 NOTE — Patient Instructions (Signed)
The risks of bleeding, perforation and infection were reviewed with patient.  

## 2018-07-08 NOTE — Progress Notes (Signed)
   Subjective:    Patient ID: Janet Mcguire, female    DOB: 11/08/1960, 57 y.o.   MRN: [id number]  HPI Referred by Dr. Randol Mcguire for rectal bleeding/pain. Pain for about a month.  Saw blood in the stool.  She strains to have a BM. Has a BM every 3rd day. Has not seen any blood in over a month. Saw Dr. Karilyn Mcguire 8/27/209 and given Rx Senna, Anusol 25mg  Rectal supp bid.  She tells me the Senna and Anusol did not help. She tells me she is constipated.  She says she has always been constipated. She says the Janet Mcguire has helped some.  She says her rectum feel weird. She says the rectal pain comes and goes.  She is not seeing blood now. She says her stools are hard except when she takes the Janet Mcguire. No family hx of colon cancer than she knows of.  Her appetite is okay. She has lost about 4.5 pounds since March. No melena.  No NSAIDs.     Review of Systems Past Medical History:  Diagnosis Date  . Allergy   . Anxiety   . Depression   . Hypertension   . Substance abuse (HCC)    "years ago"    Past Surgical History:  Procedure Laterality Date  . CHOLECYSTECTOMY    . ERCP N/A 01/12/2015   Procedure: ENDOSCOPIC RETROGRADE CHOLANGIOPANCREATOGRAPHY (ERCP) ;  Surgeon: Janet HippoNajeeb U Rehman, MD;  Location: [facility];  Service: Endoscopy;  Laterality: N/A;  Balloon Extraction  . SPHINCTEROTOMY N/A 01/12/2015   Procedure: SPHINCTEROTOMY;  Surgeon: Janet HippoNajeeb U Rehman, MD;  Location: [facility];  Service: Endoscopy;  Laterality: N/A;  . TUBAL LIGATION      No Known Allergies  Current Outpatient Medications on File Prior to Visit  Medication Sig Dispense Refill  . cholecalciferol (VITAMIN D) 1000 units tablet Take 1,000 Units by mouth daily.    . citalopram (CELEXA) 20 MG tablet Take 1 tablet (20 mg total) by mouth every evening. 90 tablet 0  . hydrochlorothiazide (HYDRODIURIL) 12.5 MG tablet Take 1 tablet (12.5 mg total) by mouth daily. (Patient taking differently: Take 25 mg by mouth daily. ) 90 tablet 0  . lidocaine 2 %  SOLN 3 mL, balanced salts SOLN 1 mL Inject 4 mLs into the eye.    . ondansetron (ZOFRAN ODT) 4 MG disintegrating tablet Take 1 tablet (4 mg total) by mouth every 8 (eight) hours as needed for nausea or vomiting. 20 tablet 0  . pantoprazole (PROTONIX) 20 MG tablet TAKE 1 TABLET BY MOUTH ONCE DAILY. 90 tablet 0   No current facility-administered medications on file prior to visit.         Objective:   Physical Exam Blood pressure 138/80, pulse 83, height 6\' 1"  (1.854 m), weight 240 lb (108.9 kg). Alert and oriented. Skin warm and dry. Oral mucosa is moist.   . Sclera anicteric, conjunctivae is pink. Thyroid not enlarged. No cervical lymphadenopathy. Lungs clear. Heart regular rate and rhythm.  Abdomen is soft. Bowel sounds are positive. No hepatomegaly. No abdominal masses felt. No tenderness.  No edema to lower extremities.  Rectal no masses felt. Stool brown and guaiac negative.         Assessment & Plan:   , Rectal pain/bleeding. Fullness in rectum. She has never undergone a colonoscopy. Needs to be screened for colon cancer. Constipation :Samples of Amitiza x 2 boxes given to patient.

## 2018-07-08 NOTE — Telephone Encounter (Signed)
Patient needs suprep 

## 2018-07-23 ENCOUNTER — Emergency Department (HOSPITAL_COMMUNITY)
Admission: EM | Admit: 2018-07-23 | Discharge: 2018-07-23 | Disposition: A | Discharge status: Home / Self Care | Payer: Medicaid Other | Attending: Emergency Medicine | Admitting: Emergency Medicine

## 2018-07-23 ENCOUNTER — Encounter (HOSPITAL_COMMUNITY): Payer: Self-pay

## 2018-07-23 ENCOUNTER — Other Ambulatory Visit: Payer: Self-pay

## 2018-07-23 DIAGNOSIS — I10 Essential (primary) hypertension: Secondary | ICD-10-CM | POA: Insufficient documentation

## 2018-07-23 DIAGNOSIS — N3000 Acute cystitis without hematuria: Secondary | ICD-10-CM | POA: Insufficient documentation

## 2018-07-23 DIAGNOSIS — R102 Pelvic and perineal pain: Secondary | ICD-10-CM | POA: Diagnosis present

## 2018-07-23 LAB — URINALYSIS, ROUTINE W REFLEX MICROSCOPIC
Bilirubin Urine: NEGATIVE
Glucose, UA: NEGATIVE mg/dL
KETONES UR: NEGATIVE mg/dL
NITRITE: POSITIVE — AB
PROTEIN: 100 mg/dL — AB
Specific Gravity, Urine: 1.018 (ref 1.005–1.030)
pH: 5 (ref 5.0–8.0)

## 2018-07-23 LAB — WET PREP, GENITAL
Sperm: NONE SEEN
TRICH WET PREP: NONE SEEN
WBC, Wet Prep HPF POC: NONE SEEN
Yeast Wet Prep HPF POC: NONE SEEN

## 2018-07-23 MED ORDER — CEPHALEXIN 500 MG PO CAPS: 500.0000 mg | ORAL_CAPSULE | Freq: Three times a day (TID) | ORAL | 0 refills | Status: AC

## 2018-07-23 MED ORDER — FLUCONAZOLE 200 MG PO TABS: 200.0000 mg | ORAL_TABLET | Freq: Once | ORAL | 0 refills | Status: DC | PRN

## 2018-07-23 NOTE — Discharge Instructions (Signed)
You have been seen in the Emergency Department (ED) today for pain when urinating.  Your workup today suggests that you have a urinary tract infection (UTI).  Please take your antibiotic as prescribed and over-the-counter pain medication (Tylenol or Motrin) as needed, but no more than recommended on the label instructions.  Drink PLENTY of fluids.  We did additional testing for HIV and other STDs and will call you if any of these tests come back positive.   Call your regular doctor to schedule the next available appointment to follow up on todays ED visit, or return immediately to the ED if your pain worsens, you have decreased urine production, develop fever, persistent vomiting, or other symptoms that concern you.

## 2018-07-23 NOTE — ED Provider Notes (Signed)
Emergency Department Provider Note   I have reviewed the triage vital signs and the nursing notes.   HISTORY  Chief Complaint Pelvic Pain   HPI Rogan Ecklund is a 57 y.o. female with PMH of HTN, depression, and remote substance use history to the emergency department with vaginal itching and discharge.  She states that symptoms have been going on for several months but worsening in the last week.  She has some associated pain.  Denies vaginal bleeding.  She is sexually active and states that this could be related to an STD but does not have any specific concerns.  Denies any dysuria, hesitancy, urgency.  No abdominal pain.  She has not had any recent STD testing.  Denies fevers or chills.   Past Medical History:  Diagnosis Date  . Allergy   . Anxiety   . Depression   . Hypertension   . Substance abuse (HCC)    "years ago"    Patient Active Problem List   Diagnosis Date Noted  . Rectal bleeding 07/08/2018  . Vitamin D deficiency 09/17/2016  . Literacy level of illiterate 09/16/2016  . Mental impairment 09/16/2016  . Colonoscopy refused 09/16/2016  . Elevated liver enzymes 01/11/2015  . Essential hypertension 01/11/2015  . Depression 01/11/2015  . History of cholecystectomy 01/11/2015    Past Surgical History:  Procedure Laterality Date  . CHOLECYSTECTOMY    . ERCP N/A 01/12/2015   Procedure: ENDOSCOPIC RETROGRADE CHOLANGIOPANCREATOGRAPHY (ERCP) ;  Surgeon: Malissa Hippo, MD;  Location: [facility];  Service: Endoscopy;  Laterality: N/A;  Balloon Extraction  . SPHINCTEROTOMY N/A 01/12/2015   Procedure: SPHINCTEROTOMY;  Surgeon: Malissa Hippo, MD;  Location: [facility];  Service: Endoscopy;  Laterality: N/A;  . TUBAL LIGATION      Allergies Ibuprofen  Family History  Problem Relation Age of Onset  . Diabetes Mother   . Cancer Brother        throat  . COPD Sister   . Kidney disease Sister   . Diabetes Sister   . Diabetes Sister   . Kidney disease Sister      Social History Social History   Tobacco Use  . Smoking status: Never Smoker  . Smokeless tobacco: Never Used  Substance Use Topics  . Alcohol use: Yes    Comment: occasional beer, last 4 days ago  . Drug use: Yes    Types: Marijuana    Review of Systems  Constitutional: No fever/chills Eyes: No visual changes. ENT: No sore throat. Cardiovascular: Denies chest pain. Respiratory: Denies shortness of breath. Gastrointestinal: No abdominal pain.  No nausea, no vomiting.  No diarrhea.  No constipation. Genitourinary: Negative for dysuria. Positive vaginal discharge and itching.  Musculoskeletal: Negative for back pain. Skin: Negative for rash. Neurological: Negative for headaches, focal weakness or numbness.  10-point ROS otherwise negative.  ____________________________________________   PHYSICAL EXAM:  VITAL SIGNS: ED Triage Vitals  Enc Vitals Group     BP 07/23/18 0756 135/67     Pulse Rate 07/23/18 0756 85     Resp 07/23/18 0756 18     Temp 07/23/18 0756 98.5 F (36.9 C)     Temp Source 07/23/18 0756 Oral     SpO2 07/23/18 0756 100 %     Weight 07/23/18 0754 240 lb (108.9 kg)     Height 07/23/18 0754 6\' 1"  (1.854 m)     Pain Score 07/23/18 0754 10   Constitutional: Alert and oriented. Well appearing and in no acute  distress. Eyes: Conjunctivae are normal.  Head: Atraumatic. Nose: No congestion/rhinnorhea. Mouth/Throat: Mucous membranes are moist.  Neck: No stridor. Cardiovascular: Normal rate, regular rhythm. Good peripheral circulation. Grossly normal heart sounds.   Respiratory: Normal respiratory effort.  No retractions. Lungs CTAB. Gastrointestinal: Soft and nontender. No distention.  Genitourinary: Pelvic exam performed with nurse tech chaperone. Normal external genitalia. Mild discharge. No CMT. Mild adnexal discomfort but nothing focal. No vaginal bleeding.  Musculoskeletal: No lower extremity tenderness nor edema. No gross deformities of  extremities. Neurologic:  Normal speech and language. No gross focal neurologic deficits are appreciated.  Skin:  Skin is warm, dry and intact. No rash noted.  ____________________________________________   LABS (all labs ordered are listed, but only abnormal results are displayed)  Labs Reviewed  WET PREP, GENITAL - Abnormal; Notable for the following components:      Result Value   Clue Cells Wet Prep HPF POC PRESENT (*)    All other components within normal limits  URINALYSIS, ROUTINE W REFLEX MICROSCOPIC - Abnormal; Notable for the following components:   APPearance HAZY (*)    Hgb urine dipstick MODERATE (*)    Protein, ur 100 (*)    Nitrite POSITIVE (*)    Leukocytes, UA TRACE (*)    Bacteria, UA MANY (*)    All other components within normal limits  URINE CULTURE  HIV ANTIBODY (ROUTINE TESTING W REFLEX)  RPR  GC/CHLAMYDIA PROBE AMP ([place]) NOT AT [facility]   ____________________________________________  RADIOLOGY  None ____________________________________________   PROCEDURES  Procedure(s) performed:   Procedures  None ____________________________________________   INITIAL IMPRESSION / ASSESSMENT AND PLAN / ED COURSE  Pertinent labs & imaging results that were available during my care of the patient were reviewed by me and considered in my medical decision making (see chart for details).  Presents to the emergency department with vaginal itching and discharge for the past several months.  Patient's pelvic exam is largely unremarkable.  Very low suspicion for PID or TOA.  Wet prep shows clue cells but no WBCs.  UA shows evidence of urinary tract infection.  STD testing is pending but patient has low suspicion for this causing symptoms so will not treat empirically.  Plan for Keflex for UTI along with PCP/GYN follow-up.  Contact information was given to the patient at discharge.  Also provided fluconazole prescription should she develop yeast infection  symptoms.   At this time, I do not feel there is any life-threatening condition present. I have reviewed and discussed all results (EKG, imaging, lab, urine as appropriate), exam findings with patient. I have reviewed nursing notes and appropriate previous records.  I feel the patient is safe to be discharged home without further emergent workup. Discussed usual and customary return precautions. Patient and family (if present) verbalize understanding and are comfortable with this plan.  Patient will follow-up with their primary care provider. If they do not have a primary care provider, information for follow-up has been provided to them. All questions have been answered.  ____________________________________________  FINAL CLINICAL IMPRESSION(S) / ED DIAGNOSES  Final diagnoses:  Pelvic pain in female  Acute cystitis without hematuria    NEW OUTPATIENT MEDICATIONS STARTED DURING THIS VISIT:  Discharge Medication List as of 07/23/2018  8:56 AM    START taking these medications   Details  cephALEXin (KEFLEX) 500 MG capsule Take 1 capsule (500 mg total) by mouth 3 (three) times daily for 7 days., Starting Thu 07/23/2018, Until Thu 07/30/2018, Normal  fluconazole (DIFLUCAN) 200 MG tablet Take 1 tablet (200 mg total) by mouth once as needed for up to 2 doses (if yeast infection symptoms develop)., Starting Thu 07/23/2018, Normal        Note:  This document was prepared using Dragon voice recognition software and may include unintentional dictation errors.  Alona Bene, MD Emergency Medicine    Jaivian Battaglini, Arlyss Repress, MD 07/23/18 [id number]

## 2018-07-23 NOTE — ED Triage Notes (Signed)
Pt reports pelvic pain x 1 week.  Reports vaginal itching and vaginal discharge.

## 2018-07-24 LAB — HIV ANTIBODY (ROUTINE TESTING W REFLEX): HIV SCREEN 4TH GENERATION: NONREACTIVE

## 2018-07-24 LAB — GC/CHLAMYDIA PROBE AMP (~~LOC~~) NOT AT ARMC
Chlamydia: NEGATIVE
NEISSERIA GONORRHEA: NEGATIVE

## 2018-07-24 LAB — RPR: RPR Ser Ql: NONREACTIVE

## 2018-07-26 LAB — URINE CULTURE: Special Requests: NORMAL

## 2018-07-27 ENCOUNTER — Telehealth: Payer: Self-pay | Admitting: Emergency Medicine

## 2018-07-27 ENCOUNTER — Emergency Department (HOSPITAL_COMMUNITY)
Admission: EM | Admit: 2018-07-27 | Discharge: 2018-07-27 | Disposition: A | Discharge status: Home / Self Care | Payer: Medicaid Other | Attending: Emergency Medicine | Admitting: Emergency Medicine

## 2018-07-27 ENCOUNTER — Other Ambulatory Visit: Payer: Self-pay

## 2018-07-27 ENCOUNTER — Encounter (HOSPITAL_COMMUNITY): Payer: Self-pay | Admitting: *Deleted

## 2018-07-27 DIAGNOSIS — I10 Essential (primary) hypertension: Secondary | ICD-10-CM | POA: Insufficient documentation

## 2018-07-27 DIAGNOSIS — N760 Acute vaginitis: Secondary | ICD-10-CM | POA: Insufficient documentation

## 2018-07-27 DIAGNOSIS — R102 Pelvic and perineal pain: Secondary | ICD-10-CM | POA: Diagnosis present

## 2018-07-27 DIAGNOSIS — Z79899 Other long term (current) drug therapy: Secondary | ICD-10-CM | POA: Diagnosis not present

## 2018-07-27 DIAGNOSIS — B9689 Other specified bacterial agents as the cause of diseases classified elsewhere: Secondary | ICD-10-CM

## 2018-07-27 MED ORDER — METRONIDAZOLE 500 MG PO TABS: 500.0000 mg | ORAL_TABLET | Freq: Two times a day (BID) | ORAL | 0 refills | Status: DC

## 2018-07-27 NOTE — ED Provider Notes (Signed)
[facility] EMERGENCY DEPARTMENT Provider Note   CSN: [id number] Arrival date & time: 07/27/18  1027     History   Chief Complaint Chief Complaint  Patient presents with  . Pelvic Pain    HPI Janet Mcguire is a 57 y.o. female.  Patient complains of vaginal itching for 2 months.  She complains of lower abdominal pain intermittent for 1 to 2 months.  Vaginal itching has been constant.  She denies any vaginal discharge.  She denies any back pain.  She denies nausea or change in her appetite.  She ate steak and rice earlier today.  She denies fever.  She was seen here on 07/23/2018 diagnosed with urinary tract infection prescribed Keflex and Diflucan which she is taking without relief.  She describes the abdominal pain is crampy and radiating from her vagina to her lower abdomen, back and forth.  Discomfort is presently mild.  Last bowel movement yesterday, normal.  Nothing makes symptoms better or worse.  No other associated symptoms.  HPI  Past Medical History:  Diagnosis Date  . Allergy   . Anxiety   . Depression   . Hypertension   . Substance abuse (HCC)    "years ago"    Patient Active Problem List   Diagnosis Date Noted  . Rectal bleeding 07/08/2018  . Vitamin D deficiency 09/17/2016  . Literacy level of illiterate 09/16/2016  . Mental impairment 09/16/2016  . Colonoscopy refused 09/16/2016  . Elevated liver enzymes 01/11/2015  . Essential hypertension 01/11/2015  . Depression 01/11/2015  . History of cholecystectomy 01/11/2015    Past Surgical History:  Procedure Laterality Date  . CHOLECYSTECTOMY    . ERCP N/A 01/12/2015   Procedure: ENDOSCOPIC RETROGRADE CHOLANGIOPANCREATOGRAPHY (ERCP) ;  Surgeon: Malissa Hippo, MD;  Location: [facility];  Service: Endoscopy;  Laterality: N/A;  Balloon Extraction  . SPHINCTEROTOMY N/A 01/12/2015   Procedure: SPHINCTEROTOMY;  Surgeon: Malissa Hippo, MD;  Location: [facility];  Service: Endoscopy;  Laterality: N/A;  . TUBAL LIGATION        OB History    Gravida  3   Para  1   Term  1   Preterm      AB  2   Living        SAB      TAB  2   Ectopic      Multiple      Live Births               Home Medications    Prior to Admission medications   Medication Sig Start Date End Date Taking? Authorizing Provider  cephALEXin (KEFLEX) 500 MG capsule Take 1 capsule (500 mg total) by mouth 3 (three) times daily for 7 days. 07/23/18 07/30/18  Maia Plan, MD  cholecalciferol (VITAMIN D) 1000 units tablet Take 1,000 Units by mouth daily.    [provider]  citalopram (CELEXA) 20 MG tablet Take 1 tablet (20 mg total) by mouth every evening. 01/27/18   Eustace Moore, MD  fluconazole (DIFLUCAN) 200 MG tablet Take 1 tablet (200 mg total) by mouth once as needed for up to 2 doses (if yeast infection symptoms develop). 07/23/18   Long, Arlyss Repress, MD  hydrochlorothiazide (HYDRODIURIL) 12.5 MG tablet Take 1 tablet (12.5 mg total) by mouth daily. Patient taking differently: Take 25 mg by mouth daily.  01/27/18   Eustace Moore, MD  lidocaine 2 % SOLN 3 mL, balanced salts SOLN 1 mL Inject 4  mLs into the eye.    [provider]  metroNIDAZOLE (FLAGYL) 500 MG tablet Take 1 tablet (500 mg total) by mouth 2 (two) times daily. One po bid x 7 days 07/27/18   Doug Sou, MD    Family History Family History  Problem Relation Age of Onset  . Diabetes Mother   . Cancer Brother        throat  . COPD Sister   . Kidney disease Sister   . Diabetes Sister   . Diabetes Sister   . Kidney disease Sister     Social History Social History   Tobacco Use  . Smoking status: Never Smoker  . Smokeless tobacco: Never Used  Substance Use Topics  . Alcohol use: Not Currently  . Drug use: Yes    Types: Marijuana    Comment: last used 07/26/18     Allergies   Ibuprofen   Review of Systems Review of Systems  Constitutional: Negative.   HENT: Negative.   Respiratory: Negative.   Cardiovascular:  Negative.   Gastrointestinal: Positive for abdominal pain.  Genitourinary:       Vaginal itching  Musculoskeletal: Negative.   Skin: Negative.   Neurological: Negative.   Psychiatric/Behavioral: Negative.   All other systems reviewed and are negative.    Physical Exam Updated Vital Signs BP (!) 138/54   Pulse 62   Temp 98.4 F (36.9 C) (Oral)   Resp 18   Ht 6\' 1"  (1.854 m)   Wt 108.9 kg   LMP  (LMP Unknown) Comment: last  one years ago   SpO2 100%   BMI 31.66 kg/m   Physical Exam  Constitutional: She appears well-developed and well-nourished. No distress.  HENT:  Head: Normocephalic and atraumatic.  Eyes: Pupils are equal, round, and reactive to light. Conjunctivae are normal.  Neck: Neck supple. No tracheal deviation present. No thyromegaly present.  Cardiovascular: Normal rate and regular rhythm.  No murmur heard. Pulmonary/Chest: Effort normal and breath sounds normal.  Abdominal: Soft. Bowel sounds are normal. She exhibits no distension. There is no tenderness.  Obese infraumbilical surgical scar  Genitourinary:  Genitourinary Comments: Pelvic exam external exam performed only with chaperone present.  No lesion.  Musculoskeletal: Normal range of motion. She exhibits no edema or tenderness.  Neurological: She is alert. Coordination normal.  Skin: Skin is warm and dry. No rash noted.  Psychiatric: She has a normal mood and affect.  Nursing note and vitals reviewed.    ED Treatments / Results  Labs (all labs ordered are listed, but only abnormal results are displayed) Labs Reviewed - No data to display  EKG None  Radiology No results found.  Procedures Procedures (including critical care time)  Medications Ordered in ED Medications - No data to display   Initial Impression / Assessment and Plan / ED Course  I have reviewed the triage vital signs and the nursing notes.  Pertinent labs & imaging results that were available during my care of the  patient were reviewed by me and considered in my medical decision making (see chart for details).     Urine culture sensitive to Keflex .  Of note clue cells seen on wet prep from previous visit.  Flagyl prescribed.  She is encouraged to keep her scheduled appointment with Dr.Gosrani oral.  Final Clinical Impressions(s) / ED Diagnoses   Final diagnoses:  Bacterial vaginosis   #2 chronic abdominal pain ED Discharge Orders         Ordered  metroNIDAZOLE (FLAGYL) 500 MG tablet  2 times daily     07/27/18 1212           Doug Sou, MD 07/27/18 1220

## 2018-07-27 NOTE — Discharge Instructions (Signed)
Take Tylenol as directed for pain.  Take the new medication prescribed as well as the medications prescribed to you at your last visit on 07/23/2018.Keep your scheduled appointment with Dr.Gosrani tomorrow

## 2018-07-27 NOTE — Progress Notes (Signed)
ED Antimicrobial Stewardship Positive Culture Follow Up   Janet Mcguire is an 57 y.o. female who presented to [facility] on 07/23/2018 with a chief complaint of  Chief Complaint  Patient presents with  . Pelvic Pain    Recent Results (from the past 720 hour(s))  Wet prep, genital     Status: Abnormal   Collection Time: 07/23/18  8:03 AM  Result Value Ref Range Status   Yeast Wet Prep HPF POC NONE SEEN NONE SEEN Final   Trich, Wet Prep NONE SEEN NONE SEEN Final   Clue Cells Wet Prep HPF POC PRESENT (A) NONE SEEN Final   WBC, Wet Prep HPF POC NONE SEEN NONE SEEN Final   Sperm NONE SEEN  Final    Comment: Performed at [facility], [address] [place], [place] [address]  Urine culture     Status: Abnormal   Collection Time: 07/23/18  8:04 AM  Result Value Ref Range Status   Specimen Description   Final    URINE, CLEAN CATCH Performed at [facility], [address] [place], [place] [address]    Special Requests   Final    Normal Performed at [facility], [address], [place], [place] [address]    Culture >=100,000 COLONIES/mL KLEBSIELLA PNEUMONIAE (A)  Final   Report Status 07/26/2018 FINAL  Final   Organism ID, Bacteria KLEBSIELLA PNEUMONIAE (A)  Final      Susceptibility   Klebsiella pneumoniae - MIC*    AMPICILLIN RESISTANT Resistant     CEFAZOLIN <=4 SENSITIVE Sensitive     CEFTRIAXONE <=1 SENSITIVE Sensitive     CIPROFLOXACIN <=0.25 SENSITIVE Sensitive     GENTAMICIN <=1 SENSITIVE Sensitive     IMIPENEM <=0.25 SENSITIVE Sensitive     NITROFURANTOIN 64 INTERMEDIATE Intermediate     TRIMETH/SULFA <=20 SENSITIVE Sensitive     AMPICILLIN/SULBACTAM <=2 SENSITIVE Sensitive     PIP/TAZO <=4 SENSITIVE Sensitive     Extended ESBL NEGATIVE Sensitive     * >=100,000 COLONIES/mL KLEBSIELLA PNEUMONIAE   Patient discharged on antibiotics, but due to no symptoms abx are not indicated. Stop antibiotics.    ED Provider: Meridee Score, MD   [name] 07/27/2018, 8:57  AM PharmD Candidate Monday - Friday phone -  [phone] Saturday - Sunday phone - [phone]

## 2018-07-27 NOTE — ED Triage Notes (Signed)
Pt c/o vaginal itching x months. Denies vaginal discharge. Pt also c/o low back pain that started last night. Denies injury. Denies dysuria. Pt reports she was dx with UTI here last Thursday and given antibiotics and fluconazole . Pt reports she has taken the medicationsas prescribed with no relief in symptoms. Pt also c/o pelvic pain that radiates up into her mid abdomen and nausea. Denies vomiting, diarrhea.

## 2018-07-27 NOTE — Telephone Encounter (Signed)
Post ED Visit - Positive Culture Follow-up: Successful Patient Follow-Up  Culture assessed and recommendations reviewed by:  []  Enzo Bi, Pharm.D. []  Celedonio Miyamoto, [address].D., BCPS AQ-ID []  Garvin Fila, Pharm.D., BCPS []  Georgina Pillion, Pharm.D., BCPS []  Mercedes, [place].D., BCPS, AAHIVP []  Estella Husk, Pharm.D., BCPS, AAHIVP []  Lysle Pearl, PharmD, BCPS []  Phillips Climes, PharmD, BCPS []  Agapito Games, PharmD, BCPS []  Verlan Friends, PharmD  Positive urine culture  []  Patient discharged without antimicrobial prescription and treatment is now indicated []  Organism is resistant to prescribed ED discharge antimicrobial []  Patient with positive blood cultures  Changes discussed with ED provider: Meridee Score PA New antibiotic prescription stop cephalexin  Patient notified to stop cephalexin   Berle Mull 07/27/2018, 1:26 PM

## 2018-07-27 NOTE — ED Notes (Signed)
Patient given discharge instruction, verbalized understand. Patient ambulatory out of the department.  

## 2018-08-26 ENCOUNTER — Encounter: Payer: Self-pay | Admitting: *Deleted

## 2018-09-03 ENCOUNTER — Encounter (INDEPENDENT_AMBULATORY_CARE_PROVIDER_SITE_OTHER): Payer: Self-pay

## 2018-09-03 ENCOUNTER — Ambulatory Visit: Payer: Medicaid Other | Admitting: Family Medicine

## 2018-09-03 ENCOUNTER — Encounter: Payer: Self-pay | Admitting: Family Medicine

## 2018-09-03 VITALS — BP 141/73 | HR 92 | Ht 73.0 in | Wt 235.8 lb

## 2018-09-03 DIAGNOSIS — N898 Other specified noninflammatory disorders of vagina: Secondary | ICD-10-CM | POA: Diagnosis not present

## 2018-09-03 LAB — POCT WET PREP (WET MOUNT): Trichomonas Wet Prep HPF POC: ABSENT

## 2018-09-03 MED ORDER — CLOTRIMAZOLE 1 % VA CREA: 1.0000 | TOPICAL_CREAM | Freq: Every day | VAGINAL | 2 refills | Status: DC

## 2018-09-03 NOTE — Patient Instructions (Signed)
Try buying this vegetable oil and using a dime sized amount to moisturize the skin of your thigh and groin to prevent itching

## 2018-09-03 NOTE — Progress Notes (Signed)
    New GYNECOLOGY CARE ENCOUNTER NOTE  Subjective:   Janet Mcguire is a 57 y.o. [id number] female here for  Chief Complaint  Patient presents with  . itching on outside of vagina    constipated at times   Vaginal itching present for > 3 months. Seen at [facility] ER for similar and reports negative testing. She reports severe itching, mostly on outside and some in the vaginal vault. She reports no discharge. She did have a new partner in the last month. Denies abnormal vaginal bleeding, discharge, pelvic pain, problems with intercourse or other gynecologic concerns.    Gynecologic History No LMP recorded (lmp unknown). Patient is postmenopausal. Contraception: post menopausal status Last Pap: Needs pap ASAP Last mammogram: 11/2017. Results were: normal  The following portions of the patient's history were reviewed and updated as appropriate: allergies, current medications, past family history, past medical history, past social history, past surgical history and problem list.  Review of Systems Pertinent items are noted in HPI.   Objective:  BP (!) 141/73 (BP Location: Right Arm, Patient Position: Sitting, Cuff Size: Large)   Pulse 92   Ht 6\' 1"  (1.854 m)   Wt 235 lb 12.8 oz (107 kg)   LMP  (LMP Unknown) Comment: last  one years ago   BMI 31.11 kg/m  Gen: well appearing, NAD HEENT: no scleral icterus CV: RR Lung: Normal WOB Ext: warm well perfused  PELVIC: Normal appearing external genitalia; normal appearing vaginal mucosa and cervix.    Normal uterine size, no other palpable masses, no uterine or adnexal tenderness. Excoriations on upper thigh.  Some yellow/thick discharge from cervical os.   MUSCULOSKELETAL: Normal range of motion.    Assessment and Plan:   1. Vaginal discharge Wet prep negative for given sx yeast is most likely. Could also be atrophic vaginitis. If therpy ineffective plan for estrace cream.  - Additionally recommend use of crisco shortening for deep epidermal  hyrdation of external area only as there maybe an eczema component.  - POCT Wet Prep ([place]et [place]) - clotrimazole (GYNE-LOTRIMIN) 1 % vaginal cream; Place 1 Applicatorful vaginally at bedtime.  Dispense: 30 g; Refill: 2  2. Vaginal pruritus - clotrimazole (GYNE-LOTRIMIN) 1 % vaginal cream; Place 1 Applicatorful vaginally at bedtime.  Dispense: 30 g; Refill: 2   Please refer to After Visit Summary for other counseling recommendations.   Return in about 2 months (around 11/03/2018) for Yearly wellness exam/pap .  Federico FlakeKimberly Niles Jaysun Wessels, MD, MPH, ABFM Attending Physician Faculty Practice- [facility]

## 2018-09-03 NOTE — Addendum Note (Signed)
Addended by: Colen DarlingYOUNG, Keiara Sneeringer S on: 09/03/2018 04:07 PM   Modules accepted: Orders

## 2018-09-03 NOTE — Addendum Note (Signed)
Addended by: Colen DarlingYOUNG,  S on: 09/03/2018 03:57 PM   Modules accepted: Orders

## 2018-09-03 NOTE — Addendum Note (Signed)
Addended by: Geanie BerlinNEWTON, Emani Morad N on: 09/03/2018 03:16 PM   Modules accepted: Orders

## 2018-09-06 LAB — NUSWAB VAGINITIS PLUS (VG+)
CANDIDA ALBICANS, NAA: NEGATIVE
CANDIDA GLABRATA, NAA: NEGATIVE
Chlamydia trachomatis, NAA: NEGATIVE
Neisseria gonorrhoeae, NAA: NEGATIVE
TRICH VAG BY NAA: NEGATIVE

## 2018-09-10 ENCOUNTER — Ambulatory Visit (HOSPITAL_COMMUNITY)
Admission: RE | Admit: 2018-09-10 | Discharge: 2018-09-10 | Disposition: A | Discharge status: Home / Self Care | Payer: Medicaid Other | Source: Ambulatory Visit | Attending: Internal Medicine | Admitting: Internal Medicine

## 2018-09-10 ENCOUNTER — Encounter (HOSPITAL_COMMUNITY): Payer: Self-pay

## 2018-09-10 ENCOUNTER — Encounter (HOSPITAL_COMMUNITY)
Admission: RE | Disposition: A | Discharge status: Home / Self Care | Payer: Self-pay | Source: Ambulatory Visit | Attending: Internal Medicine

## 2018-09-10 ENCOUNTER — Other Ambulatory Visit: Payer: Self-pay

## 2018-09-10 DIAGNOSIS — I10 Essential (primary) hypertension: Secondary | ICD-10-CM | POA: Diagnosis not present

## 2018-09-10 DIAGNOSIS — K921 Melena: Secondary | ICD-10-CM | POA: Diagnosis present

## 2018-09-10 DIAGNOSIS — Z886 Allergy status to analgesic agent status: Secondary | ICD-10-CM | POA: Diagnosis not present

## 2018-09-10 DIAGNOSIS — K648 Other hemorrhoids: Secondary | ICD-10-CM | POA: Diagnosis not present

## 2018-09-10 DIAGNOSIS — K5900 Constipation, unspecified: Secondary | ICD-10-CM | POA: Diagnosis not present

## 2018-09-10 DIAGNOSIS — K625 Hemorrhage of anus and rectum: Secondary | ICD-10-CM | POA: Insufficient documentation

## 2018-09-10 DIAGNOSIS — Z79899 Other long term (current) drug therapy: Secondary | ICD-10-CM | POA: Diagnosis not present

## 2018-09-10 HISTORY — PX: COLONOSCOPY: SHX5424

## 2018-09-10 SURGERY — COLONOSCOPY
Anesthesia: Moderate Sedation

## 2018-09-10 MED ORDER — PSYLLIUM 58.6 % PO POWD: 1.0000 | Freq: Every day | ORAL | 12 refills | Status: DC

## 2018-09-10 MED ORDER — DEXAMETHASONE SODIUM PHOSPHATE 4 MG/ML IJ SOLN
4.0000 mg | Freq: Once | INTRAMUSCULAR | Status: AC
  Administered 2018-09-10: 4 mg via INTRAVENOUS

## 2018-09-10 MED ORDER — MEPERIDINE HCL 50 MG/ML IJ SOLN
INTRAMUSCULAR | Status: DC | PRN
  Administered 2018-09-10 (×3): 25 mg via INTRAVENOUS

## 2018-09-10 MED ORDER — MEPERIDINE HCL 50 MG/ML IJ SOLN
INTRAMUSCULAR | Status: AC
  Filled 2018-09-10: qty 1

## 2018-09-10 MED ORDER — SODIUM CHLORIDE 0.9 % IV SOLN
INTRAVENOUS | Status: DC
  Administered 2018-09-10: 09:00:00 via INTRAVENOUS

## 2018-09-10 MED ORDER — DOCUSATE SODIUM 100 MG PO CAPS: 200.0000 mg | ORAL_CAPSULE | Freq: Every day | ORAL | 0 refills | Status: DC

## 2018-09-10 MED ORDER — SODIUM CHLORIDE 0.9% FLUSH
INTRAVENOUS | Status: AC
  Filled 2018-09-10: qty 10

## 2018-09-10 MED ORDER — MIDAZOLAM HCL 5 MG/5ML IJ SOLN
INTRAMUSCULAR | Status: DC | PRN
  Administered 2018-09-10 (×5): 2 mg via INTRAVENOUS

## 2018-09-10 MED ORDER — STERILE WATER FOR IRRIGATION IR SOLN
Status: DC | PRN
  Administered 2018-09-10: 10:00:00

## 2018-09-10 MED ORDER — PROMETHAZINE HCL 25 MG/ML IJ SOLN
INTRAMUSCULAR | Status: AC
  Filled 2018-09-10: qty 1

## 2018-09-10 MED ORDER — MIDAZOLAM HCL 5 MG/5ML IJ SOLN
INTRAMUSCULAR | Status: AC
  Filled 2018-09-10: qty 10

## 2018-09-10 MED ORDER — DEXAMETHASONE SODIUM PHOSPHATE 4 MG/ML IJ SOLN
INTRAMUSCULAR | Status: AC
  Filled 2018-09-10: qty 1

## 2018-09-10 MED ORDER — PROMETHAZINE HCL 25 MG/ML IJ SOLN
INTRAMUSCULAR | Status: DC | PRN
  Administered 2018-09-10: 6.25 mg via INTRAVENOUS

## 2018-09-10 NOTE — Op Note (Addendum)
[facility] Patient Name: Janet Mcguire Procedure Date: 09/10/2018 9:05 AM MRN: [id number] Date of Birth: 05/20/1961 Attending MD: Lionel December , MD CSN: [id number] Age: 57 Admit Type: Outpatient Procedure:                Colonoscopy Indications:              Rectal bleeding Providers:                Lionel December, MD, Criselda Peaches. Patsy Lager, RN, Dyann Ruddle Referring MD:             Wilson Singer, MD Medicines:                Promethazine 6.25 mg IV, Meperidine 75 mg IV,                            Midazolam 10 mg IV Complications:            No immediate complications. Estimated Blood Loss:     Estimated blood loss: none. Procedure:                Pre-Anesthesia Assessment:                           - Prior to the procedure, a History and Physical                            was performed, and patient medications and                            allergies were reviewed. The patient's tolerance of                            previous anesthesia was also reviewed. The risks                            and benefits of the procedure and the sedation                            options and risks were discussed with the patient.                            All questions were answered, and informed consent                            was obtained. Prior Anticoagulants: The patient has                            taken no previous anticoagulant or antiplatelet                            agents. ASA Grade Assessment: I - A normal, healthy  patient. After reviewing the risks and benefits,                            the patient was deemed in satisfactory condition to                            undergo the procedure.                           After obtaining informed consent, the colonoscope                            was passed under direct vision. Throughout the                            procedure, the patient's blood pressure, pulse, and                             oxygen saturations were monitored continuously. The                            [id number]-[id number] ([id number]) scope was introduced through                            the anus and advanced to the the cecum, identified                            by appendiceal orifice and ileocecal valve. The                            colonoscopy was performed without difficulty. The                            patient tolerated the procedure well. The quality                            of the bowel preparation was excellent. Scope In: 9:43:30 AM Scope Out: 9:57:09 AM Scope Withdrawal Time: 0 hours 7 minutes 30 seconds  Total Procedure Duration: 0 hours 13 minutes 39 seconds  Findings:      The perianal and digital rectal examinations were normal.      The colon (entire examined portion) appeared normal.      Internal hemorrhoids were found during retroflexion. The hemorrhoids       were medium-sized. Impression:               - The entire examined colon is normal.                           - Internal hemorrhoids.                           - No specimens collected. Moderate Sedation:      Moderate (conscious) sedation was administered by the endoscopy nurse       and supervised by the endoscopist. The following parameters were  monitored: oxygen saturation, heart rate, blood pressure, CO2       capnography and response to care. Total physician intraservice time was       24 minutes. Recommendation:           - Patient has a contact number available for                            emergencies. The signs and symptoms of potential                            delayed complications were discussed with the                            patient. Return to normal activities tomorrow.                            Written discharge instructions were provided to the                            patient.                           - High fiber diet today.                           - Continue present  medications.                           - Colace 200 mg by mouth daily.                           - Use original regular Metamucil two tablespoon or                            4 to 6 g PO daily.                           - Repeat colonoscopy in 10 years for screening                            purposes. Procedure Code(s):        --- Professional ---                           [phone], Colonoscopy, flexible; diagnostic, including                            collection of specimen(s) by brushing or washing,                            when performed (separate procedure)                           99153, Moderate sedation; each additional 15  minutes intraservice time                           G0500, Moderate sedation services provided by the                            same physician or other qualified health care                            professional performing a gastrointestinal                            endoscopic service that sedation supports,                            requiring the presence of an independent trained                            observer to assist in the monitoring of the                            patient's level of consciousness and physiological                            status; initial 15 minutes of intra-service time;                            patient age 40 years or older (additional time may                            be reported with 16109, as appropriate) Diagnosis Code(s):        --- Professional ---                           K64.8, Other hemorrhoids                           K62.5, Hemorrhage of anus and rectum CPT copyright 2018 American Medical Association. All rights reserved. The codes documented in this report are preliminary and upon coder review may  be revised to meet current compliance requirements. Lionel December, MD Lionel December, MD 09/10/2018 10:05:40 AM This report has been signed electronically. Number of Addenda: 0

## 2018-09-10 NOTE — Discharge Instructions (Signed)
Resume usual medications as before. Metamucil 4 to 6 g by mouth daily at bedtime. Colace 200 mg daily at bedtime. High-fiber diet. No driving for 24 hours. Next screening exam in 10 years.   Colonoscopy, Adult, Care After This sheet gives you information about how to care for yourself after your procedure. Your doctor may also give you more specific instructions. If you have problems or questions, call your doctor. Follow these instructions at home: General instructions   For the first 24 hours after the procedure: ? Do not drive or use machinery. ? Do not sign important documents. ? Do not drink alcohol. ? Do your daily activities more slowly than normal. ? Eat foods that are soft and easy to digest. ? Rest often.  Take over-the-counter or prescription medicines only as told by your doctor.  It is up to you to get the results of your procedure. Ask your doctor, or the department performing the procedure, when your results will be ready. To help cramping and bloating:  Try walking around.  Put heat on your belly (abdomen) as told by your doctor. Use a heat source that your doctor recommends, such as a moist heat pack or a heating pad. ? Put a towel between your skin and the heat source. ? Leave the heat on for 20-30 minutes. ? Remove the heat if your skin turns bright red. This is especially important if you cannot feel pain, heat, or cold. You can get burned. Eating and drinking  Drink enough fluid to keep your pee (urine) clear or pale yellow.  Return to your normal diet as told by your doctor. Avoid heavy or fried foods that are hard to digest.  Avoid drinking alcohol for as long as told by your doctor. Contact a doctor if:  You have blood in your poop (stool) 2-3 days after the procedure. Get help right away if:  You have more than a small amount of blood in your poop.  You see large clumps of tissue (blood clots) in your poop.  Your belly is swollen.  You feel  sick to your stomach (nauseous).  You throw up (vomit).  You have a fever.  You have belly pain that gets worse, and medicine does not help your pain. This information is not intended to replace advice given to you by your health care provider. Make sure you discuss any questions you have with your health care provider. Document Released: 11/09/2010 Document Revised: 07/01/2016 Document Reviewed: 07/01/2016 Elsevier Interactive Patient Education  2017 Elsevier Inc.  Hemorrhoids Hemorrhoids are swollen veins in and around the rectum or anus. There are two types of hemorrhoids:  Internal hemorrhoids. These occur in the veins that are just inside the rectum. They may poke through to the outside and become irritated and painful.  External hemorrhoids. These occur in the veins that are outside of the anus and can be felt as a painful swelling or hard lump near the anus.  Most hemorrhoids do not cause serious problems, and they can be managed with home treatments such as diet and lifestyle changes. If home treatments do not help your symptoms, procedures can be done to shrink or remove the hemorrhoids. What are the causes? This condition is caused by increased pressure in the anal area. This pressure may result from various things, including:  Constipation.  Straining to have a bowel movement.  Diarrhea.  Pregnancy.  Obesity.  Sitting for long periods of time.  Heavy lifting or other activity that causes  you to strain.  Anal sex.  What are the signs or symptoms? Symptoms of this condition include:  Pain.  Anal itching or irritation.  Rectal bleeding.  Leakage of stool (feces).  Anal swelling.  One or more lumps around the anus.  How is this diagnosed? This condition can often be diagnosed through a visual exam. Other exams or tests may also be done, such as:  Examination of the rectal area with a gloved hand (digital rectal exam).  Examination of the anal canal  using a small tube (anoscope).  A blood test, if you have lost a significant amount of blood.  A test to look inside the colon (sigmoidoscopy or colonoscopy).  How is this treated? This condition can usually be treated at home. However, various procedures may be done if dietary changes, lifestyle changes, and other home treatments do not help your symptoms. These procedures can help make the hemorrhoids smaller or remove them completely. Some of these procedures involve surgery, and others do not. Common procedures include:  Rubber band ligation. Rubber bands are placed at the base of the hemorrhoids to cut off the blood supply to them.  Sclerotherapy. Medicine is injected into the hemorrhoids to shrink them.  Infrared coagulation. A type of light energy is used to get rid of the hemorrhoids.  Hemorrhoidectomy surgery. The hemorrhoids are surgically removed, and the veins that supply them are tied off.  Stapled hemorrhoidopexy surgery. A circular stapling device is used to remove the hemorrhoids and use staples to cut off the blood supply to them.  Follow these instructions at home: Eating and drinking  Eat foods that have a lot of fiber in them, such as whole grains, beans, nuts, fruits, and vegetables. Ask your health care provider about taking products that have added fiber (fiber supplements).  Drink enough fluid to keep your urine clear or pale yellow. Managing pain and swelling  Take warm sitz baths for 20 minutes, 3-4 times a day to ease pain and discomfort.  If directed, apply ice to the affected area. Using ice packs between sitz baths may be helpful. ? Put ice in a plastic bag. ? Place a towel between your skin and the bag. ? Leave the ice on for 20 minutes, 2-3 times a day. General instructions  Take over-the-counter and prescription medicines only as told by your health care provider.  Use medicated creams or suppositories as told.  Exercise regularly.  Go to the  bathroom when you have the urge to have a bowel movement. Do not wait.  Avoid straining to have bowel movements.  Keep the anal area dry and clean. Use wet toilet paper or moist towelettes after a bowel movement.  Do not sit on the toilet for long periods of time. This increases blood pooling and pain. Contact a health care provider if:  You have increasing pain and swelling that are not controlled by treatment or medicine.  You have uncontrolled bleeding.  You have difficulty having a bowel movement, or you are unable to have a bowel movement.  You have pain or inflammation outside the area of the hemorrhoids. This information is not intended to replace advice given to you by your health care provider. Make sure you discuss any questions you have with your health care provider. Document Released: 10/04/2000 Document Revised: 03/06/2016 Document Reviewed: 06/21/2015 Elsevier Interactive Patient Education  [employer]2018 Elsevier Inc.

## 2018-09-10 NOTE — H&P (Signed)
Janet Mcguire is an 57 y.o. female.   Chief Complaint: Patient is here for colonoscopy. HPI: Patient is a 57 year old Afro-American female who experienced few episodes of hematochezia few months ago.  She is also having rectal discomfort and has had constipation for the last few months.  She has not passed any blood in over a month.  She did have Hemoccult in May 2019 and was negative.  She does not remember any details today.  She denies abdominal pain.  She states she has good appetite. Family history is negative for CRC.  Past Medical History:  Diagnosis Date  . Allergy   . Anxiety   . Depression   . Hypertension   . Substance abuse (HCC)    "years ago"    Past Surgical History:  Procedure Laterality Date  . CHOLECYSTECTOMY    . ERCP N/A 01/12/2015   Procedure: ENDOSCOPIC RETROGRADE CHOLANGIOPANCREATOGRAPHY (ERCP) ;  Surgeon: Malissa HippoNajeeb U , MD;  Location: [facility];  Service: Endoscopy;  Laterality: N/A;  Balloon Extraction  . SPHINCTEROTOMY N/A 01/12/2015   Procedure: SPHINCTEROTOMY;  Surgeon: Malissa HippoNajeeb U , MD;  Location: [facility];  Service: Endoscopy;  Laterality: N/A;  . TUBAL LIGATION      Family History  Problem Relation Age of Onset  . Diabetes Mother   . Cancer Brother        throat  . COPD Sister   . Kidney disease Sister   . Diabetes Sister   . Diabetes Sister   . Kidney disease Sister    Social History:  reports that she has never smoked. She has never used smokeless tobacco. She reports that she drank alcohol. She reports that she has current or past drug history. Drug: Marijuana.  Allergies:  Allergies  Allergen Reactions  . Ibuprofen     Causes soreness to stomach, per pt.    Medications Prior to Admission  Medication Sig Dispense Refill  . clotrimazole (GYNE-LOTRIMIN) 1 % vaginal cream Place 1 Applicatorful vaginally at bedtime. 30 g 2  . hydrochlorothiazide (HYDRODIURIL) 25 MG tablet Take 25 mg by mouth daily.      No results found for this or any  previous visit (from the past 48 hour(s)). No results found.  ROS  Blood pressure 129/80, pulse 94, temperature 98.6 F (37 C), temperature source Oral, resp. rate 17, SpO2 100 %. Physical Exam  Constitutional: She appears well-developed and well-nourished.  HENT:  She has fullness over her right side of face to the right of her nose where she has a scar from prior surgery.  Eyes: Conjunctivae are normal. No scleral icterus.  Neck: No thyromegaly present.  Cardiovascular: Normal rate, regular rhythm and normal heart sounds.  No murmur heard. Respiratory: Effort normal and breath sounds normal.  GI:  Abdomen is full.  She has lower midline scar.  It is soft and nontender with organomegaly or masses.  Musculoskeletal: She exhibits no edema.  Lymphadenopathy:    She has no cervical adenopathy.  Neurological: She is alert.  Skin: Skin is warm and dry.     Assessment/Plan Rectal bleeding. Diagnostic colonoscopy.  Lionel DecemberNajeeb , MD 09/10/2018, 9:26 AM

## 2018-09-11 ENCOUNTER — Other Ambulatory Visit: Payer: Self-pay

## 2018-09-11 ENCOUNTER — Encounter (HOSPITAL_COMMUNITY): Payer: Self-pay | Admitting: Emergency Medicine

## 2018-09-11 ENCOUNTER — Emergency Department (HOSPITAL_COMMUNITY)
Admission: EM | Admit: 2018-09-11 | Discharge: 2018-09-11 | Disposition: A | Discharge status: Home / Self Care | Payer: Medicaid Other | Attending: Emergency Medicine | Admitting: Emergency Medicine

## 2018-09-11 DIAGNOSIS — F419 Anxiety disorder, unspecified: Secondary | ICD-10-CM | POA: Insufficient documentation

## 2018-09-11 DIAGNOSIS — F329 Major depressive disorder, single episode, unspecified: Secondary | ICD-10-CM | POA: Diagnosis not present

## 2018-09-11 DIAGNOSIS — I10 Essential (primary) hypertension: Secondary | ICD-10-CM | POA: Insufficient documentation

## 2018-09-11 DIAGNOSIS — Z9049 Acquired absence of other specified parts of digestive tract: Secondary | ICD-10-CM | POA: Insufficient documentation

## 2018-09-11 DIAGNOSIS — K6289 Other specified diseases of anus and rectum: Secondary | ICD-10-CM

## 2018-09-11 DIAGNOSIS — R21 Rash and other nonspecific skin eruption: Secondary | ICD-10-CM | POA: Diagnosis present

## 2018-09-11 MED ORDER — DIPHENHYDRAMINE HCL 25 MG PO TABS: 25.0000 mg | ORAL_TABLET | Freq: Four times a day (QID) | ORAL | 0 refills | Status: DC

## 2018-09-11 MED ORDER — DIPHENHYDRAMINE HCL 50 MG/ML IJ SOLN
25.0000 mg | Freq: Once | INTRAMUSCULAR | Status: AC
  Administered 2018-09-11: 25 mg via INTRAMUSCULAR
  Filled 2018-09-11: qty 1

## 2018-09-11 MED ORDER — LIDOCAINE-HYDROCORTISONE ACE 3-0.5 % RE CREA: 1.0000 | TOPICAL_CREAM | Freq: Two times a day (BID) | RECTAL | 0 refills | Status: AC

## 2018-09-11 NOTE — ED Provider Notes (Signed)
[facility] Emergency Department Provider Note MRN:  [id number]  Arrival date & time: 09/11/18     Chief Complaint   Rash   History of Present Illness   Janet Mcguire is a 57 y.o. year-old female with a history of hypertension presenting to the ED with chief complaint of rash.  Patient complaining of itchy rash to her chest and back.  Patient had a colonoscopy yesterday.  Was given medicines during the colonoscopy but no other new medications or exposures.  No shortness of breath, no fever, no oral lesions or sores, no sensation of throat closure, no chest pain, shortness of breath.  Patient also complaining of vaginal and/or rectal irritation recently.  Denies pain, no discharge, no vaginal bleeding.  Review of Systems  A complete 10 system review of systems was obtained and all systems are negative except as noted in the HPI and PMH.   Patient's Health History    Past Medical History:  Diagnosis Date  . Allergy   . Anxiety   . Depression   . Hypertension   . Substance abuse (HCC)    "years ago"    Past Surgical History:  Procedure Laterality Date  . CHOLECYSTECTOMY    . ERCP N/A 01/12/2015   Procedure: ENDOSCOPIC RETROGRADE CHOLANGIOPANCREATOGRAPHY (ERCP) ;  Surgeon: Malissa Hippo, MD;  Location: [facility];  Service: Endoscopy;  Laterality: N/A;  Balloon Extraction  . SPHINCTEROTOMY N/A 01/12/2015   Procedure: SPHINCTEROTOMY;  Surgeon: Malissa Hippo, MD;  Location: [facility];  Service: Endoscopy;  Laterality: N/A;  . TUBAL LIGATION      Family History  Problem Relation Age of Onset  . Diabetes Mother   . Cancer Brother        throat  . COPD Sister   . Kidney disease Sister   . Diabetes Sister   . Diabetes Sister   . Kidney disease Sister     Social History   Socioeconomic History  . Marital status: Single    Spouse name: Not on file  . Number of children: 1  . Years of education: 8  . Highest education level: Not on file  Occupational History    . Occupation: disabled    Comment: "slow learner"  Social Needs  . Financial resource strain: Not on file  . Food insecurity:    Worry: Not on file    Inability: Not on file  . Transportation needs:    Medical: Not on file    Non-medical: Not on file  Tobacco Use  . Smoking status: Never Smoker  . Smokeless tobacco: Never Used  Substance and Sexual Activity  . Alcohol use: Not Currently  . Drug use: Yes    Types: Marijuana    Comment: last use Tuesday   . Sexual activity: Not Currently    Birth control/protection: Surgical    Comment: tubal  Lifestyle  . Physical activity:    Days per week: Not on file    Minutes per session: Not on file  . Stress: Not on file  Relationships  . Social connections:    Talks on phone: Not on file    Gets together: Not on file    Attends religious service: Not on file    Active member of club or organization: Not on file    Attends meetings of clubs or organizations: Not on file    Relationship status: Not on file  . Intimate partner violence:    Fear of current or ex  partner: Not on file    Emotionally abused: Not on file    Physically abused: Not on file    Forced sexual activity: Not on file  Other Topics Concern  . Not on file  Social History Narrative   Lives alone   Manages own household   "slow learner"   Walks for exercise   Does not drive   Does not read well   Youngest of 13 children [place]     Physical Exam  Vital Signs and Nursing Notes reviewed Vitals:   09/11/18 0801  BP: (!) 147/55  Pulse: 78  Resp: 18  Temp: 98.5 F (36.9 C)  SpO2: 100%    CONSTITUTIONAL: Well-appearing, NAD NEURO:  Alert and oriented x 3, no focal deficits EYES:  eyes equal and reactive ENT/NECK:  no LAD, no JVD CARDIO: Regular rate, well-perfused, normal S1 and S2 PULM:  CTAB no wheezing or rhonchi GI/GU:  normal bowel sounds, non-distended, non-tender; normal-appearing external genitalia, no external hemorrhoids,  mild tenderness to palpation of the anus and distal rectum MSK/SPINE:  No gross deformities, no edema SKIN: Excoriated, very mild scattered papular rash to the chest and thoracic back PSYCH:  Appropriate speech and behavior  Diagnostic and Interventional Summary    Labs Reviewed - No data to display  No orders to display    Medications  diphenhydrAMINE (BENADRYL) injection 25 mg (has no administration in time range)     Procedures Critical Care  ED Course and Medical Decision Making  I have reviewed the triage vital signs and the nursing notes.  Pertinent labs & imaging results that were available during my care of the patient were reviewed by me and considered in my medical decision making (see below for details).  Possible drug reaction in this 57 year old female with recent exposure to antiemetics during colonoscopy yesterday.  No evidence to suggest anaphylaxis or significant allergic reaction.  Vital signs stable.  Pending pelvic exam given irritation in this area, will discharge afterwards with Benadryl creams.  9:08 AM Genital rectal exam largely unremarkable, patient able to localize her discomfort more to the rectum with no symptoms related to her vulva or vagina.  Normal rectal tone, no gross blood, no palpable masses or obstructions or areas of fluctuance.  Patient explains she has had chronic rectal irritation for nearly a year.  This irritation was present during her CT scan back in May, was present during her colonoscopy yesterday.  Unclear etiology, provided with rectal cream for symptom control, will follow up with PCP.  After the discussed management above, the patient was determined to be safe for discharge.  The patient was in agreement with this plan and all questions regarding their care were answered.  ED return precautions were discussed and the patient will return to the ED with any significant worsening of condition.  Elmer SowMichael M. Pilar PlateBero, MD [facility] [email or web address]  Final Clinical Impressions(s) / ED Diagnoses     ICD-10-CM   1. Rash R21   2. Rectal irritation K62.89     ED Discharge Orders         Ordered    diphenhydrAMINE (BENADRYL) 25 MG tablet  Every 6 hours     09/11/18 0906    lidocaine-hydrocortisone (ANAMANTEL HC) 3-0.5 % CREA  2 times daily     09/11/18 0906             Sabas SousBero, Ceazia Harb M, MD 09/11/18 [phone]

## 2018-09-11 NOTE — ED Triage Notes (Signed)
Pt c/o itchy rash to chest, between breasts, and on back x 3 days. Denies new medications, detergents, lotions, etc.

## 2018-09-11 NOTE — Discharge Instructions (Addendum)
You were evaluated in the Emergency Department and after careful evaluation, we did not find any emergent condition requiring admission or further testing in the hospital.  Use the Benadryl tablets for your rash to help with the itching.  You can use the cream provided for your rectal irritation.  Please return to the Emergency Department if you experience any worsening of your condition.  We encourage you to follow up with a primary care provider.  Thank you for allowing us to be a part of your care.

## 2018-09-14 ENCOUNTER — Emergency Department (HOSPITAL_COMMUNITY): Payer: Medicaid Other

## 2018-09-14 ENCOUNTER — Emergency Department (HOSPITAL_COMMUNITY)
Admission: EM | Admit: 2018-09-14 | Discharge: 2018-09-15 | Disposition: A | Discharge status: Home / Self Care | Payer: Medicaid Other | Attending: Emergency Medicine | Admitting: Emergency Medicine

## 2018-09-14 ENCOUNTER — Encounter (HOSPITAL_COMMUNITY): Payer: Self-pay | Admitting: Emergency Medicine

## 2018-09-14 ENCOUNTER — Other Ambulatory Visit: Payer: Self-pay

## 2018-09-14 DIAGNOSIS — R1084 Generalized abdominal pain: Secondary | ICD-10-CM | POA: Insufficient documentation

## 2018-09-14 DIAGNOSIS — R112 Nausea with vomiting, unspecified: Secondary | ICD-10-CM | POA: Diagnosis not present

## 2018-09-14 DIAGNOSIS — I10 Essential (primary) hypertension: Secondary | ICD-10-CM | POA: Insufficient documentation

## 2018-09-14 DIAGNOSIS — Z79899 Other long term (current) drug therapy: Secondary | ICD-10-CM | POA: Diagnosis not present

## 2018-09-14 LAB — COMPREHENSIVE METABOLIC PANEL
ALT: 45 U/L — ABNORMAL HIGH (ref 0–44)
AST: 32 U/L (ref 15–41)
Albumin: 4.4 g/dL (ref 3.5–5.0)
Alkaline Phosphatase: 100 U/L (ref 38–126)
Anion gap: 11 (ref 5–15)
BUN: 12 mg/dL (ref 6–20)
CHLORIDE: 103 mmol/L (ref 98–111)
CO2: 27 mmol/L (ref 22–32)
Calcium: 9.9 mg/dL (ref 8.9–10.3)
Creatinine, Ser: 0.89 mg/dL (ref 0.44–1.00)
Glucose, Bld: 114 mg/dL — ABNORMAL HIGH (ref 70–99)
POTASSIUM: 3.1 mmol/L — AB (ref 3.5–5.1)
SODIUM: 141 mmol/L (ref 135–145)
Total Bilirubin: 1 mg/dL (ref 0.3–1.2)
Total Protein: 9.3 g/dL — ABNORMAL HIGH (ref 6.5–8.1)

## 2018-09-14 LAB — URINALYSIS, ROUTINE W REFLEX MICROSCOPIC
BACTERIA UA: NONE SEEN
BILIRUBIN URINE: NEGATIVE
Glucose, UA: NEGATIVE mg/dL
Ketones, ur: 20 mg/dL — AB
LEUKOCYTES UA: NEGATIVE
NITRITE: NEGATIVE
Specific Gravity, Urine: >1.046 — ABNORMAL HIGH (ref 1.005–1.030)
pH: 6 (ref 5.0–8.0)

## 2018-09-14 LAB — CBC
HEMATOCRIT: 43 % (ref 36.0–46.0)
HEMOGLOBIN: 13.4 g/dL (ref 12.0–15.0)
MCH: 27.8 pg (ref 26.0–34.0)
MCHC: 31.2 g/dL (ref 30.0–36.0)
MCV: 89.2 fL (ref 80.0–100.0)
NRBC: 0 % (ref 0.0–0.2)
Platelets: 360 10*3/uL (ref 150–400)
RBC: 4.82 MIL/uL (ref 3.87–5.11)
RDW: 14.8 % (ref 11.5–15.5)
WBC: 8.8 10*3/uL (ref 4.0–10.5)

## 2018-09-14 LAB — LIPASE, BLOOD: LIPASE: 26 U/L (ref 11–51)

## 2018-09-14 MED ORDER — ONDANSETRON HCL 4 MG/2ML IJ SOLN
4.0000 mg | INTRAMUSCULAR | Status: DC | PRN
  Administered 2018-09-14: 4 mg via INTRAVENOUS
  Filled 2018-09-14 (×2): qty 2

## 2018-09-14 MED ORDER — FAMOTIDINE IN NACL 20-0.9 MG/50ML-% IV SOLN
20.0000 mg | Freq: Once | INTRAVENOUS | Status: AC
  Administered 2018-09-14: 20 mg via INTRAVENOUS
  Filled 2018-09-14: qty 50

## 2018-09-14 MED ORDER — IOPAMIDOL (ISOVUE-300) INJECTION 61%
100.0000 mL | Freq: Once | INTRAVENOUS | Status: AC | PRN
  Administered 2018-09-14: 100 mL via INTRAVENOUS

## 2018-09-14 MED ORDER — ONDANSETRON 4 MG PO TBDP: 4.0000 mg | ORAL_TABLET | Freq: Three times a day (TID) | ORAL | 0 refills | Status: DC | PRN

## 2018-09-14 MED ORDER — POTASSIUM CHLORIDE CRYS ER 20 MEQ PO TBCR
40.0000 meq | EXTENDED_RELEASE_TABLET | Freq: Once | ORAL | Status: AC
  Administered 2018-09-14: 40 meq via ORAL
  Filled 2018-09-14: qty 2

## 2018-09-14 MED ORDER — SODIUM CHLORIDE 0.9 % IV BOLUS
1000.0000 mL | Freq: Once | INTRAVENOUS | Status: AC
  Administered 2018-09-14: 1000 mL via INTRAVENOUS

## 2018-09-14 NOTE — ED Notes (Signed)
Went to discharge patient and patient not found in room; IV still documented so [facility] notified and going to check to see if pt still has her IV

## 2018-09-14 NOTE — ED Provider Notes (Signed)
[facility] EMERGENCY DEPARTMENT Provider Note   CSN: [id number] Arrival date & time: 09/14/18  1334     History   Chief Complaint Chief Complaint  Patient presents with  . Abdominal Pain    HPI Janet Mcguire is a 57 y.o. female.   Abdominal Pain      Pt was seen at 1655.  Per pt, c/o gradual onset and persistence of constant generalized abd "pain" for the past 4 days.  Has been associated with multiple intermittent episodes of N/V. States her symptoms began the day she had a colonoscopy but she "didn't tell anyone" nor call her GI MD.  Denies diarrhea, no fevers, no back pain, no rash, no CP/SOB, no black or blood in stools or emesis.       Past Medical History:  Diagnosis Date  . Allergy   . Anxiety   . Depression   . Hypertension   . Substance abuse (HCC)    "years ago"    Patient Active Problem List   Diagnosis Date Noted  . Rectal bleeding 07/08/2018  . Vitamin D deficiency 09/17/2016  . Literacy level of illiterate 09/16/2016  . Mental impairment 09/16/2016  . Colonoscopy refused 09/16/2016  . Elevated liver enzymes 01/11/2015  . Essential hypertension 01/11/2015  . Depression 01/11/2015  . History of cholecystectomy 01/11/2015    Past Surgical History:  Procedure Laterality Date  . CHOLECYSTECTOMY    . ERCP N/A 01/12/2015   Procedure: ENDOSCOPIC RETROGRADE CHOLANGIOPANCREATOGRAPHY (ERCP) ;  Surgeon: Malissa Hippo, MD;  Location: [facility];  Service: Endoscopy;  Laterality: N/A;  Balloon Extraction  . SPHINCTEROTOMY N/A 01/12/2015   Procedure: SPHINCTEROTOMY;  Surgeon: Malissa Hippo, MD;  Location: [facility];  Service: Endoscopy;  Laterality: N/A;  . TUBAL LIGATION       OB History    Gravida  3   Para  1   Term  1   Preterm      AB  2   Living  1     SAB  2   TAB  0   Ectopic      Multiple      Live Births  1            Home Medications    Prior to Admission medications   Medication Sig Start Date End Date Taking?  Authorizing Provider  clotrimazole (GYNE-LOTRIMIN) 1 % vaginal cream Place 1 Applicatorful vaginally at bedtime. Patient taking differently: Place 1 Applicatorful vaginally at bedtime as needed (for irritation).  09/03/18  Yes Federico Flake, MD  diphenhydrAMINE (BENADRYL) 25 MG tablet Take 1 tablet (25 mg total) by mouth every 6 (six) hours. Patient taking differently: Take 25-50 mg by mouth every 6 (six) hours.  09/11/18  Yes Sabas Sous, MD  docusate sodium (COLACE) 100 MG capsule Take 2 capsules (200 mg total) by mouth daily. 09/10/18  Yes Rehman, Joline Maxcy, MD  hydrochlorothiazide (HYDRODIURIL) 25 MG tablet Take 25 mg by mouth daily.   Yes [provider]  psyllium (METAMUCIL SMOOTH TEXTURE) 58.6 % powder Take 1 packet by mouth daily. 09/10/18  Yes Rehman, Joline Maxcy, MD  lidocaine-hydrocortisone (ANAMANTEL HC) 3-0.5 % CREA Place 1 Applicatorful rectally 2 (two) times daily for 7 days. Patient not taking: Reported on 09/14/2018 09/11/18 09/18/18  Sabas Sous, MD    Family History Family History  Problem Relation Age of Onset  . Diabetes Mother   . Cancer Brother  throat  . COPD Sister   . Kidney disease Sister   . Diabetes Sister   . Diabetes Sister   . Kidney disease Sister     Social History Social History   Tobacco Use  . Smoking status: Never Smoker  . Smokeless tobacco: Never Used  Substance Use Topics  . Alcohol use: Not Currently  . Drug use: Yes    Types: Marijuana    Comment: last use Tuesday      Allergies   Ibuprofen   Review of Systems Review of Systems  Gastrointestinal: Positive for abdominal pain.  ROS: Statement: All systems negative except as marked or noted in the HPI; Constitutional: Negative for fever and chills. ; ; Eyes: Negative for eye pain, redness and discharge. ; ; ENMT: Negative for ear pain, hoarseness, nasal congestion, sinus pressure and sore throat. ; ; Cardiovascular: Negative for chest pain, palpitations,  diaphoresis, dyspnea and peripheral edema. ; ; Respiratory: Negative for cough, wheezing and stridor. ; ; Gastrointestinal: +abd pain, N/V. Negative for diarrhea, blood in stool, hematemesis, jaundice and rectal bleeding. . ; ; Genitourinary: Negative for dysuria, flank pain and hematuria. ; ; Musculoskeletal: Negative for back pain and neck pain. Negative for swelling and trauma.; ; Skin: Negative for pruritus, rash, abrasions, blisters, bruising and skin lesion.; ; Neuro: Negative for headache, lightheadedness and neck stiffness. Negative for weakness, altered level of consciousness, altered mental status, extremity weakness, paresthesias, involuntary movement, seizure and syncope.       Physical Exam Updated Vital Signs BP (!) 147/77 (BP Location: Right Arm)   Pulse 95   Temp 99.3 F (37.4 C) (Oral)   Resp 16   Ht 6' (1.829 m)   Wt 94.3 kg   LMP  (LMP Unknown) Comment: last  one years ago   SpO2 100%   BMI 28.21 kg/m   Physical Exam 1700: Physical examination:  Nursing notes reviewed; Vital signs and O2 SAT reviewed;  Constitutional: Well developed, Well nourished, Well hydrated, In no acute distress; Head:  Normocephalic, atraumatic; Eyes: EOMI, PERRL, No scleral icterus; ENMT: Mouth and pharynx normal, Mucous membranes moist; Neck: Supple, Full range of motion, No lymphadenopathy; Cardiovascular: Regular rate and rhythm, No gallop; Respiratory: Breath sounds clear & equal bilaterally, No wheezes.  Speaking full sentences with ease, Normal respiratory effort/excursion; Chest: Nontender, Movement normal; Abdomen: Soft, +mild diffuse tenderness to palp. No rebound or guarding. Nondistended, Normal bowel sounds; Genitourinary: No CVA tenderness; Extremities: Peripheral pulses normal, No tenderness, No edema, No calf edema or asymmetry.; Neuro: AA&Ox3, Major CN grossly intact.  Speech clear. No gross focal motor or sensory deficits in extremities.; Skin: Color normal, Warm, Dry.   ED  Treatments / Results  Labs (all labs ordered are listed, but only abnormal results are displayed)   EKG None  Radiology   Procedures Procedures (including critical care time)  Medications Ordered in ED Medications  sodium chloride 0.9 % bolus 1,000 mL (1,000 mLs Intravenous New Bag/Given 09/14/18 1950)  ondansetron (ZOFRAN) injection 4 mg (4 mg Intravenous Given 09/14/18 1845)  famotidine (PEPCID) IVPB 20 mg premix (20 mg Intravenous New Bag/Given 09/14/18 1952)  iopamidol (ISOVUE-300) 61 % injection 100 mL (100 mLs Intravenous Contrast Given 09/14/18 1850)     Initial Impression / Assessment and Plan / ED Course  I have reviewed the triage vital signs and the nursing notes.  Pertinent labs & imaging results that were available during my care of the patient were reviewed by me and considered in my  medical decision making (see chart for details).  MDM Reviewed: previous chart, nursing note and vitals Reviewed previous: labs Interpretation: labs, x-ray and CT scan   Results for orders placed or performed during the hospital encounter of 09/14/18  Lipase, blood  Result Value Ref Range   Lipase 26 11 - 51 U/L  Comprehensive metabolic panel  Result Value Ref Range   Sodium 141 135 - 145 mmol/L   Potassium 3.1 (L) 3.5 - 5.1 mmol/L   Chloride 103 98 - 111 mmol/L   CO2 27 22 - 32 mmol/L   Glucose, Bld 114 (H) 70 - 99 mg/dL   BUN 12 6 - 20 mg/dL   Creatinine, Ser 1.61 0.44 - 1.00 mg/dL   Calcium 9.9 8.9 - 09.6 mg/dL   Total Protein 9.3 (H) 6.5 - 8.1 g/dL   Albumin 4.4 3.5 - 5.0 g/dL   AST 32 15 - 41 U/L   ALT 45 (H) 0 - 44 U/L   Alkaline Phosphatase 100 38 - 126 U/L   Total Bilirubin 1.0 0.3 - 1.2 mg/dL   GFR calc non Af Amer >60 >60 mL/min   GFR calc Af Amer >60 >60 mL/min   Anion gap 11 5 - 15  CBC  Result Value Ref Range   WBC 8.8 4.0 - 10.5 K/uL   RBC 4.82 3.87 - 5.11 MIL/uL   Hemoglobin 13.4 12.0 - 15.0 g/dL   HCT 04.5 40.9 - 81.1 %   MCV 89.2 80.0 - 100.0  fL   MCH 27.8 26.0 - 34.0 pg   MCHC 31.2 30.0 - 36.0 g/dL   RDW 91.4 78.2 - 95.6 %   Platelets 360 150 - 400 K/uL   nRBC 0.0 0.0 - 0.2 %  Urinalysis, Routine w reflex microscopic  Result Value Ref Range   Color, Urine YELLOW YELLOW   APPearance CLEAR CLEAR   Specific Gravity, Urine >1.046 (H) 1.005 - 1.030   pH 6.0 5.0 - 8.0   Glucose, UA NEGATIVE NEGATIVE mg/dL   Hgb urine dipstick SMALL (A) NEGATIVE   Bilirubin Urine NEGATIVE NEGATIVE   Ketones, ur 20 (A) NEGATIVE mg/dL   Protein, ur >=213 (A) NEGATIVE mg/dL   Nitrite NEGATIVE NEGATIVE   Leukocytes, UA NEGATIVE NEGATIVE   RBC / HPF 6-10 0 - 5 RBC/hpf   WBC, UA 0-5 0 - 5 WBC/hpf   Bacteria, UA NONE SEEN NONE SEEN   Squamous Epithelial / LPF 0-5 0 - 5   Mucus PRESENT    Dg Chest 2 View Result Date: 09/14/2018 CLINICAL DATA:  Nausea and vomiting with abdominal pain. Colonoscopy on Thursday. EXAM: CHEST - 2 VIEW COMPARISON:  01/31/2017 radiographs FINDINGS: No findings free intraperitoneal gas beneath the hemidiaphragms. The lungs appear clear.  Cardiac and mediastinal contours normal. No pleural effusion identified. Thoracic spondylosis, attributable for the densities projecting over the thoracic spine on the lateral projection. IMPRESSION: 1.  No active cardiopulmonary disease is radiographically apparent. 2. No free intraperitoneal gas beneath the hemidiaphragms. 3. Thoracic spondylosis. Electronically Signed   By: Gaylyn Rong M.D.   On: 09/14/2018 19:33   Ct Abdomen Pelvis W Contrast Result Date: 09/14/2018 CLINICAL DATA:  Left-sided abdominal pain and nausea with vomiting for 4 days after colonoscopy. EXAM: CT ABDOMEN AND PELVIS WITH CONTRAST TECHNIQUE: Multidetector CT imaging of the abdomen and pelvis was performed using the standard protocol following bolus administration of intravenous contrast. CONTRAST:  ISOVUE-300 IOPAMIDOL (ISOVUE-300) INJECTION 61% COMPARISON:  03/04/2018 FINDINGS: Lower Chest: No  acute  findings. Hepatobiliary: No hepatic masses identified. Prior cholecystectomy. No evidence of biliary obstruction. Pancreas:  No mass or inflammatory changes. Spleen: Within normal limits in size and appearance. Adrenals/Urinary Tract: No masses identified. No evidence of hydronephrosis. Unremarkable unopacified urinary bladder. Stomach/Bowel: Tiny hiatal hernia, decreased in size since previous study. No evidence of obstruction, inflammatory process or abnormal fluid collections. No evidence of free intraperitoneal air. Normal appendix visualized. Vascular/Lymphatic: No pathologically enlarged lymph nodes. No abdominal aortic aneurysm. Reproductive:  No mass or other significant abnormality. Other:  None. Musculoskeletal:  No suspicious bone lesions identified. IMPRESSION: No acute findings. Decreased size of tiny hiatal hernia. Electronically Signed   By: Myles RosenthalJohn  Stahl M.D.   On: 09/14/2018 19:28    2215:  Potassium repleted PO. Pt has tol PO well while in the ED without N/V.  No stooling while in the ED.  Abd benign, VSS. Feels better and wants to go home now. Tx symptomatically at this time. Dx and testing d/w pt.  Questions answered.  Verb understanding, agreeable to d/c home with outpt f/u.    Final Clinical Impressions(s) / ED Diagnoses   Final diagnoses:  None    ED Discharge Orders    None       Samuel JesterMcManus, Hilmar Moldovan, DO 09/17/18 1642

## 2018-09-14 NOTE — Discharge Instructions (Addendum)
Take the prescription as directed.  Increase your fluid intake (ie:  Gatoraide) for the next few days.  Eat a bland diet and advance to your regular diet slowly as you can tolerate it.  Call your regular medical doctor tomorrow to schedule a follow up appointment this week.  Call your GI doctor tomorrow to schedule a follow up appointment within the next week. Return to the Emergency Department immediately sooner if worsening.

## 2018-09-14 NOTE — ED Triage Notes (Signed)
Pt c/o nausea and vomiting with abdominal pain since colonoscopy on Thursday.

## 2018-09-15 NOTE — ED Notes (Signed)
[employer] went to pt's residence and noone at home; pt not found

## 2018-09-16 ENCOUNTER — Encounter (HOSPITAL_COMMUNITY): Payer: Self-pay | Admitting: Internal Medicine

## 2018-09-16 LAB — URINE CULTURE

## 2018-10-08 ENCOUNTER — Encounter (HOSPITAL_COMMUNITY): Payer: Self-pay

## 2018-10-08 ENCOUNTER — Observation Stay (HOSPITAL_COMMUNITY)
Admission: EM | Admit: 2018-10-08 | Discharge: 2018-10-10 | Disposition: A | Discharge status: Home / Self Care | Payer: Medicaid Other | Attending: Internal Medicine | Admitting: Internal Medicine

## 2018-10-08 ENCOUNTER — Other Ambulatory Visit: Payer: Self-pay

## 2018-10-08 DIAGNOSIS — R1012 Left upper quadrant pain: Secondary | ICD-10-CM

## 2018-10-08 DIAGNOSIS — E876 Hypokalemia: Secondary | ICD-10-CM | POA: Diagnosis not present

## 2018-10-08 DIAGNOSIS — F12188 Cannabis abuse with other cannabis-induced disorder: Secondary | ICD-10-CM | POA: Insufficient documentation

## 2018-10-08 DIAGNOSIS — I10 Essential (primary) hypertension: Secondary | ICD-10-CM | POA: Diagnosis not present

## 2018-10-08 DIAGNOSIS — G8929 Other chronic pain: Secondary | ICD-10-CM | POA: Diagnosis present

## 2018-10-08 DIAGNOSIS — R1013 Epigastric pain: Secondary | ICD-10-CM

## 2018-10-08 DIAGNOSIS — Z79899 Other long term (current) drug therapy: Secondary | ICD-10-CM | POA: Insufficient documentation

## 2018-10-08 DIAGNOSIS — F79 Unspecified intellectual disabilities: Secondary | ICD-10-CM

## 2018-10-08 DIAGNOSIS — R112 Nausea with vomiting, unspecified: Secondary | ICD-10-CM

## 2018-10-08 MED ORDER — SODIUM CHLORIDE 0.9 % IV BOLUS
1000.0000 mL | Freq: Once | INTRAVENOUS | Status: AC
  Administered 2018-10-09: 1000 mL via INTRAVENOUS

## 2018-10-08 NOTE — ED Triage Notes (Addendum)
Pt reports onset of upper abd pain last night with vomiting, no diarrhea.   Pt states she last ate at 2 pm today.  Last BM yesterday

## 2018-10-09 ENCOUNTER — Encounter (HOSPITAL_COMMUNITY): Payer: Self-pay

## 2018-10-09 DIAGNOSIS — R1013 Epigastric pain: Secondary | ICD-10-CM

## 2018-10-09 DIAGNOSIS — E876 Hypokalemia: Secondary | ICD-10-CM | POA: Diagnosis not present

## 2018-10-09 DIAGNOSIS — I10 Essential (primary) hypertension: Secondary | ICD-10-CM | POA: Diagnosis not present

## 2018-10-09 DIAGNOSIS — G8929 Other chronic pain: Secondary | ICD-10-CM | POA: Diagnosis present

## 2018-10-09 LAB — CBC
HCT: 33.2 % — ABNORMAL LOW (ref 36.0–46.0)
Hemoglobin: 10.4 g/dL — ABNORMAL LOW (ref 12.0–15.0)
MCH: 28.1 pg (ref 26.0–34.0)
MCHC: 31.3 g/dL (ref 30.0–36.0)
MCV: 89.7 fL (ref 80.0–100.0)
NRBC: 0 % (ref 0.0–0.2)
Platelets: 326 10*3/uL (ref 150–400)
RBC: 3.7 MIL/uL — ABNORMAL LOW (ref 3.87–5.11)
RDW: 14.5 % (ref 11.5–15.5)
WBC: 9.9 10*3/uL (ref 4.0–10.5)

## 2018-10-09 LAB — URINALYSIS, ROUTINE W REFLEX MICROSCOPIC
Bacteria, UA: NONE SEEN
Bilirubin Urine: NEGATIVE
Glucose, UA: NEGATIVE mg/dL
Ketones, ur: NEGATIVE mg/dL
LEUKOCYTES UA: NEGATIVE
Nitrite: NEGATIVE
Protein, ur: 100 mg/dL — AB
Specific Gravity, Urine: 1.023 (ref 1.005–1.030)
pH: 5 (ref 5.0–8.0)

## 2018-10-09 LAB — COMPREHENSIVE METABOLIC PANEL
ALT: 37 U/L (ref 0–44)
ANION GAP: 10 (ref 5–15)
AST: 27 U/L (ref 15–41)
Albumin: 3.9 g/dL (ref 3.5–5.0)
Alkaline Phosphatase: 87 U/L (ref 38–126)
BILIRUBIN TOTAL: 0.6 mg/dL (ref 0.3–1.2)
BUN: 13 mg/dL (ref 6–20)
CO2: 25 mmol/L (ref 22–32)
Calcium: 9.2 mg/dL (ref 8.9–10.3)
Chloride: 103 mmol/L (ref 98–111)
Creatinine, Ser: 0.8 mg/dL (ref 0.44–1.00)
Glucose, Bld: 132 mg/dL — ABNORMAL HIGH (ref 70–99)
POTASSIUM: 2.4 mmol/L — AB (ref 3.5–5.1)
Sodium: 138 mmol/L (ref 135–145)
TOTAL PROTEIN: 8.1 g/dL (ref 6.5–8.1)

## 2018-10-09 LAB — CBC WITH DIFFERENTIAL/PLATELET
Abs Immature Granulocytes: 0.04 10*3/uL (ref 0.00–0.07)
BASOS ABS: 0.1 10*3/uL (ref 0.0–0.1)
BASOS PCT: 1 %
EOS ABS: 0.3 10*3/uL (ref 0.0–0.5)
EOS PCT: 2 %
HCT: 36.2 % (ref 36.0–46.0)
Hemoglobin: 11.7 g/dL — ABNORMAL LOW (ref 12.0–15.0)
IMMATURE GRANULOCYTES: 0 %
Lymphocytes Relative: 30 %
Lymphs Abs: 3.3 10*3/uL (ref 0.7–4.0)
MCH: 28.9 pg (ref 26.0–34.0)
MCHC: 32.3 g/dL (ref 30.0–36.0)
MCV: 89.4 fL (ref 80.0–100.0)
Monocytes Absolute: 1 10*3/uL (ref 0.1–1.0)
Monocytes Relative: 9 %
NEUTROS PCT: 58 %
NRBC: 0 % (ref 0.0–0.2)
Neutro Abs: 6.4 10*3/uL (ref 1.7–7.7)
PLATELETS: 360 10*3/uL (ref 150–400)
RBC: 4.05 MIL/uL (ref 3.87–5.11)
RDW: 14.2 % (ref 11.5–15.5)
WBC: 11 10*3/uL — ABNORMAL HIGH (ref 4.0–10.5)

## 2018-10-09 LAB — RAPID URINE DRUG SCREEN, HOSP PERFORMED
Amphetamines: NOT DETECTED
Barbiturates: NOT DETECTED
Benzodiazepines: NOT DETECTED
Cocaine: NOT DETECTED
OPIATES: NOT DETECTED
TETRAHYDROCANNABINOL: POSITIVE — AB

## 2018-10-09 LAB — BASIC METABOLIC PANEL
Anion gap: 5 (ref 5–15)
BUN: 9 mg/dL (ref 6–20)
CO2: 24 mmol/L (ref 22–32)
Calcium: 8.4 mg/dL — ABNORMAL LOW (ref 8.9–10.3)
Chloride: 112 mmol/L — ABNORMAL HIGH (ref 98–111)
Creatinine, Ser: 0.71 mg/dL (ref 0.44–1.00)
GFR calc Af Amer: >60 mL/min (ref >60)
GFR calc non Af Amer: >60 mL/min (ref >60)
Glucose, Bld: 114 mg/dL — ABNORMAL HIGH (ref 70–99)
Potassium: 3.7 mmol/L (ref 3.5–5.1)
Sodium: 141 mmol/L (ref 135–145)

## 2018-10-09 LAB — ETHANOL: Alcohol, Ethyl (B): <10 mg/dL (ref <10)

## 2018-10-09 LAB — MAGNESIUM
Magnesium: 2.1 mg/dL (ref 1.7–2.4)
Magnesium: 2 mg/dL (ref 1.7–2.4)

## 2018-10-09 LAB — LIPASE, BLOOD: LIPASE: 29 U/L (ref 11–51)

## 2018-10-09 MED ORDER — ENSURE ENLIVE PO LIQD
237.0000 mL | Freq: Two times a day (BID) | ORAL | Status: DC
  Administered 2018-10-09 (×2): 237 mL via ORAL

## 2018-10-09 MED ORDER — POTASSIUM CHLORIDE CRYS ER 20 MEQ PO TBCR
EXTENDED_RELEASE_TABLET | ORAL | Status: AC
  Filled 2018-10-09: qty 2

## 2018-10-09 MED ORDER — ALUM & MAG HYDROXIDE-SIMETH 200-200-20 MG/5ML PO SUSP: 30.0000 mL | ORAL | Status: DC | PRN

## 2018-10-09 MED ORDER — POTASSIUM CHLORIDE 10 MEQ/100ML IV SOLN
10.0000 meq | INTRAVENOUS | Status: AC
  Administered 2018-10-09 (×4): 10 meq via INTRAVENOUS
  Filled 2018-10-09 (×4): qty 100

## 2018-10-09 MED ORDER — POTASSIUM CHLORIDE IN NACL 40-0.9 MEQ/L-% IV SOLN
INTRAVENOUS | Status: DC
  Administered 2018-10-09: 75 mL/h via INTRAVENOUS

## 2018-10-09 MED ORDER — ONDANSETRON HCL 4 MG/2ML IJ SOLN
4.0000 mg | Freq: Once | INTRAMUSCULAR | Status: AC
  Administered 2018-10-09: 4 mg via INTRAVENOUS
  Filled 2018-10-09: qty 2

## 2018-10-09 MED ORDER — FAMOTIDINE IN NACL 20-0.9 MG/50ML-% IV SOLN
20.0000 mg | Freq: Once | INTRAVENOUS | Status: AC
  Administered 2018-10-09: 20 mg via INTRAVENOUS
  Filled 2018-10-09: qty 50

## 2018-10-09 MED ORDER — POTASSIUM CHLORIDE CRYS ER 20 MEQ PO TBCR: 40.0000 meq | EXTENDED_RELEASE_TABLET | Freq: Four times a day (QID) | ORAL | Status: DC | PRN

## 2018-10-09 MED ORDER — ONDANSETRON HCL 4 MG/2ML IJ SOLN
4.0000 mg | Freq: Four times a day (QID) | INTRAMUSCULAR | Status: DC | PRN
  Administered 2018-10-09 – 2018-10-10 (×2): 4 mg via INTRAVENOUS
  Filled 2018-10-09 (×2): qty 2

## 2018-10-09 MED ORDER — LACTATED RINGERS IV SOLN
INTRAVENOUS | Status: AC
  Administered 2018-10-09: 10:00:00 via INTRAVENOUS

## 2018-10-09 MED ORDER — ONDANSETRON HCL 4 MG PO TABS
4.0000 mg | ORAL_TABLET | Freq: Four times a day (QID) | ORAL | Status: DC | PRN
  Administered 2018-10-09: 4 mg via ORAL
  Filled 2018-10-09: qty 1

## 2018-10-09 MED ORDER — DICYCLOMINE HCL 10 MG/ML IM SOLN
20.0000 mg | Freq: Once | INTRAMUSCULAR | Status: AC
  Administered 2018-10-09: 20 mg via INTRAMUSCULAR
  Filled 2018-10-09: qty 2

## 2018-10-09 MED ORDER — KETOROLAC TROMETHAMINE 15 MG/ML IJ SOLN
15.0000 mg | Freq: Four times a day (QID) | INTRAMUSCULAR | Status: DC | PRN
  Administered 2018-10-09 – 2018-10-10 (×3): 15 mg via INTRAVENOUS
  Filled 2018-10-09 (×3): qty 1

## 2018-10-09 MED ORDER — PANTOPRAZOLE SODIUM 40 MG PO TBEC
40.0000 mg | DELAYED_RELEASE_TABLET | Freq: Two times a day (BID) | ORAL | Status: DC
  Administered 2018-10-09 – 2018-10-10 (×4): 40 mg via ORAL
  Filled 2018-10-09 (×4): qty 1

## 2018-10-09 MED ORDER — POTASSIUM CHLORIDE CRYS ER 20 MEQ PO TBCR
40.0000 meq | EXTENDED_RELEASE_TABLET | ORAL | Status: DC
  Administered 2018-10-09 (×2): 40 meq via ORAL
  Filled 2018-10-09 (×2): qty 2

## 2018-10-09 MED ORDER — POTASSIUM CHLORIDE CRYS ER 20 MEQ PO TBCR
40.0000 meq | EXTENDED_RELEASE_TABLET | Freq: Once | ORAL | Status: AC
  Administered 2018-10-09: 40 meq via ORAL

## 2018-10-09 NOTE — Progress Notes (Signed)
Initial Nutrition Assessment  DOCUMENTATION CODES:     INTERVENTION:  Ensure Enlive po BID, each supplement provides 350 kcal and 20 grams of protein   Heart Healthy diet   NUTRITION DIAGNOSIS:   Unintentional weight loss related to (unknown etiology), likely related to patient eating pattern small meals- no breakfast and abdominal pain as evidenced by percent weight loss (6% x 1 month) per hospital records.   GOAL: Pt to meet >/= 90% of their estimated nutrition needs     MONITOR:  Supplement acceptance, PO intake, Weight trends, Labs   REASON FOR ASSESSMENT:   Malnutrition Screening Tool    ASSESSMENT: Patient is a 57 yo female from home with a hx of HIV, substance abuse, GERD, Sickle cell, Osteoporosis and Intellectual disability. She presents with complaint of abdominal pain and found to be hypokalemic.   Patient eats small meals/portions. When asked about intake of foods - corn green beans fruit, steak are mentioned. Beverages are water and juice mostly. She is not a breakfast eater but likes oral nutrition supplements. Ensure Enlive 100% consumed and encouraged pt to consider continuing at home for breakfast daily.   Net weight loss noted- the past month down 6% which is significant. Patient denies changes in appetite, eating regimen or activity level. Moderate fat loss to orbital area and moderate temporalis muscle depletion.   Medications reviewed and include:  Protonix, K-dur, Toradol  Labs: BMP Latest Ref Rng & Units 10/09/2018 10/09/2018 09/14/2018  Glucose 70 - 99 mg/dL 161(W114(H) 960(A132(H) 540(J114(H)  BUN 6 - 20 mg/dL 9 13 12   Creatinine 0.44 - 1.00 mg/dL 8.110.71 9.140.80 7.820.89  Sodium 135 - 145 mmol/L 141 138 141  Potassium 3.5 - 5.1 mmol/L 3.7 2.4(LL) 3.1(L)  Chloride 98 - 111 mmol/L 112(H) 103 103  CO2 22 - 32 mmol/L 24 25 27   Calcium 8.9 - 10.3 mg/dL 9.5(A8.4(L) 9.2 9.9     Diet Order:   Diet Order            Diet Heart Room service appropriate? Yes; Fluid consistency: Thin   Diet effective now              EDUCATION: opportunity somewhat limited due to cognition   Education needs have been addressed(encouraged adding nutrition supplment at breakfast daily) Skin:  Skin Assessment: Reviewed RN Assessment  Last BM:  12/17   Height:   Ht Readings from Last 1 Encounters:  10/09/18 6\' 1"  (1.854 m)    Weight:   Wt Readings from Last 1 Encounters:  10/09/18 99.8 kg    Ideal Body Weight:  75 kg  BMI:  Body mass index is 29.03 kg/m.  Estimated Nutritional Needs:   Kcal:  1700-1800 (17-18 kcal/kg/bw)  Protein:  95-105  (1.3-1.4 gr/kg/ibw)  Fluid:  1.7-1.8 liters daily   Royann ShiversLynn Latori Beggs MS,RD,CSG,LDN Office: [phone] Pager: [phone]

## 2018-10-09 NOTE — Progress Notes (Signed)
Per HPI:  Janet Mcguire is a 57 y.o. female with medical history significant of hypertension, anxiety, intellectual disability comes in with abdominal pain.  Patient has had frequent emergency room visits for various complaints.  Many have been due to epigastric abdominal pain.  It sounds like patient does not follow-up with primary care physician I think there may be some intellectual incapabilities here.  She reports she lives by herself.  She thinks that she may have been diagnosed with gastritis in the past but does not really know.  She does not really think she is ever had a EGD in the past.  She does occasionally vomit but does not have blood.  She denies any fevers or coughing.  She eats and drinks not as well as she should.  She really cannot tell me if eating relieves or makes her pain worse.  She does not take any over-the-counter medications except Tylenol.  Patient during her work-up here is found to have a potassium level of 2.4 and therefore referred for admission for her hypokalemia.  She has been seen and examined this morning and continues to have abdominal pain as well as some ongoing nausea and vomiting.  Her potassium level has improved to 3.7 and therefore, I will switch her IV fluid to LR and continue to monitor.  I suspect much of this may be related to her home use of HCTZ.  She is noted to be positive for [facility] in her urine which may be contributing to her symptoms.  We will try to discharge in the next 24 to 48 hours once symptomatically improved.

## 2018-10-09 NOTE — ED Provider Notes (Signed)
[facility] EMERGENCY DEPARTMENT Provider Note   CSN: [id number] Arrival date & time: 10/08/18  2334  Time seen 12:30 AM   History   Chief Complaint Chief Complaint  Patient presents with  . Abdominal Pain    HPI Janet Mcguire is a 57 y.o. female.  HPI patient states she was laying down about 7 PM and had acute onset of what appears to be diffuse abdominal pain that she describes as burning.  She denies having burning fluid in her throat or that her vomitus is burning.  She has nausea and has vomited twice.  She denies any diarrhea or fever.  She states she is never had this before although she has had several ED visits for abdominal pain.  She denies any food that she ate that could have started this.  Nothing she does makes it feel worse, nothing she does makes it feel better.  PCP ?  [facility]   Past Medical History:  Diagnosis Date  . Allergy   . Anxiety   . Depression   . Hypertension   . Substance abuse (HCC)    "years ago"    Patient Active Problem List   Diagnosis Date Noted  . Hypokalemia 10/09/2018  . Abdominal pain, chronic, epigastric 10/09/2018  . Rectal bleeding 07/08/2018  . Vitamin D deficiency 09/17/2016  . Literacy level of illiterate 09/16/2016  . Mental impairment 09/16/2016  . Colonoscopy refused 09/16/2016  . Elevated liver enzymes 01/11/2015  . Essential hypertension 01/11/2015  . Depression 01/11/2015  . History of cholecystectomy 01/11/2015    Past Surgical History:  Procedure Laterality Date  . CHOLECYSTECTOMY    . COLONOSCOPY N/A 09/10/2018   Procedure: COLONOSCOPY;  Surgeon: Malissa Hippo, MD;  Location: [facility] ENDO [facility]ITE;  Service: Endoscopy;  Laterality: N/A;  2:00  . ERCP N/A 01/12/2015   Procedure: ENDOSCOPIC RETROGRADE CHOLANGIOPANCREATOGRAPHY (ERCP) ;  Surgeon: Malissa Hippo, MD;  Location: [facility] ORS;  Service: Endoscopy;  Laterality: N/A;  Balloon Extraction  . SPHINCTEROTOMY N/A 01/12/2015   Procedure: SPHINCTEROTOMY;  Surgeon:  Malissa Hippo, MD;  Location: [facility] ORS;  Service: Endoscopy;  Laterality: N/A;  . TUBAL LIGATION       OB History    Gravida  3   Para  1   Term  1   Preterm      AB  2   Living  1     SAB  2   TAB  0   Ectopic      Multiple      Live Births  1            Home Medications    Prior to Admission medications   Medication Sig Start Date End Date Taking? Authorizing Provider  clotrimazole (GYNE-LOTRIMIN) 1 % vaginal cream Place 1 Applicatorful vaginally at bedtime. Patient taking differently: Place 1 Applicatorful vaginally at bedtime as needed (for irritation).  09/03/18   Federico Flake, MD  diphenhydrAMINE (BENADRYL) 25 MG tablet Take 1 tablet (25 mg total) by mouth every 6 (six) hours. Patient taking differently: Take 25-50 mg by mouth every 6 (six) hours.  09/11/18   Sabas Sous, MD  docusate sodium (COLACE) 100 MG capsule Take 2 capsules (200 mg total) by mouth daily. 09/10/18   Rehman, Joline Maxcy, MD  hydrochlorothiazide (HYDRODIURIL) 25 MG tablet Take 25 mg by mouth daily.    [provider]  ondansetron (ZOFRAN ODT) 4 MG disintegrating tablet Take 1 tablet (4 mg total)  by mouth every 8 (eight) hours as needed for nausea or vomiting. 09/14/18   Samuel JesterMcManus, Kathleen, DO  psyllium (METAMUCIL SMOOTH TEXTURE) 58.6 % powder Take 1 packet by mouth daily. 09/10/18   Malissa Hippoehman, Najeeb U, MD    Family History Family History  Problem Relation Age of Onset  . Diabetes Mother   . Cancer Brother        throat  . COPD Sister   . Kidney disease Sister   . Diabetes Sister   . Diabetes Sister   . Kidney disease Sister     Social History Social History   Tobacco Use  . Smoking status: Never Smoker  . Smokeless tobacco: Never Used  Substance Use Topics  . Alcohol use: Not Currently  . Drug use: Yes    Types: Marijuana    Comment: last use Tuesday   On disability for learning disability Patient denies any street drugs.   Allergies    Ibuprofen   Review of Systems Review of Systems  All other systems reviewed and are negative.    Physical Exam Updated Vital Signs BP 127/81   Pulse (!) 108   Temp 98.5 F (36.9 C) (Oral)   Resp 18   Ht 6\' 1"  (1.854 m)   Wt 99.8 kg   LMP  (LMP Unknown) Comment: last  one years ago   SpO2 100%   BMI 29.03 kg/m   Physical Exam Vitals signs and nursing note reviewed.  Constitutional:      General: She is not in acute distress.    Appearance: Normal appearance. She is well-developed. She is not ill-appearing or toxic-appearing.  HENT:     Head: Normocephalic and atraumatic.     Comments: Patient has a surgical scar on the lateral aspect of her nose with some swelling laterally to that.  Patient states she had surgery for "excess tissue".  She does not know if it is getting bigger or not despite saying she sees it in the mirror every day.    Right Ear: External ear normal.     Left Ear: External ear normal.     Nose: Nose normal. No mucosal edema or rhinorrhea.     Mouth/Throat:     Dentition: No dental abscesses.     Pharynx: No uvula swelling.  Eyes:     Conjunctiva/sclera: Conjunctivae normal.     Pupils: Pupils are equal, round, and reactive to light.  Neck:     Musculoskeletal: Full passive range of motion without pain, normal range of motion and neck supple.  Cardiovascular:     Rate and Rhythm: Normal rate and regular rhythm.     Heart sounds: Normal heart sounds. No murmur. No friction rub. No gallop.   Pulmonary:     Effort: Pulmonary effort is normal. No respiratory distress.     Breath sounds: Normal breath sounds. No wheezing, rhonchi or rales.  Chest:     Chest wall: No tenderness or crepitus.  Abdominal:     General: Bowel sounds are normal. There is no distension.     Palpations: Abdomen is soft.     Tenderness: There is abdominal tenderness. There is no guarding or rebound.       Comments: Patient has diffuse tenderness above her umbilicus but  states it is most tender in her left upper quadrant.  Musculoskeletal: Normal range of motion.        General: No tenderness.     Comments: Moves all extremities well.   Skin:  General: Skin is warm and dry.     Coloration: Skin is not pale.     Findings: No erythema or rash.  Neurological:     Mental Status: She is alert and oriented to person, place, and time.     Cranial Nerves: No cranial nerve deficit.  Psychiatric:        Mood and Affect: Mood is not anxious.        Speech: Speech normal.        Behavior: Behavior normal.      ED Treatments / Results  Labs (all labs ordered are listed, but only abnormal results are displayed) Results for orders placed or performed during the hospital encounter of 10/08/18  Comprehensive metabolic panel  Result Value Ref Range   Sodium 138 135 - 145 mmol/L   Potassium 2.4 (LL) 3.5 - 5.1 mmol/L   Chloride 103 98 - 111 mmol/L   CO2 25 22 - 32 mmol/L   Glucose, Bld 132 (H) 70 - 99 mg/dL   BUN 13 6 - 20 mg/dL   Creatinine, Ser 2.130.80 0.44 - 1.00 mg/dL   Calcium 9.2 8.9 - 08.610.3 mg/dL   Total Protein 8.1 6.5 - 8.1 g/dL   Albumin 3.9 3.5 - 5.0 g/dL   AST 27 15 - 41 U/L   ALT 37 0 - 44 U/L   Alkaline Phosphatase 87 38 - 126 U/L   Total Bilirubin 0.6 0.3 - 1.2 mg/dL   GFR calc non Af Amer >60 >60 mL/min   GFR calc Af Amer >60 >60 mL/min   Anion gap 10 5 - 15  Lipase, blood  Result Value Ref Range   Lipase 29 11 - 51 U/L  CBC with Differential  Result Value Ref Range   WBC 11.0 (H) 4.0 - 10.5 K/uL   RBC 4.05 3.87 - 5.11 MIL/uL   Hemoglobin 11.7 (L) 12.0 - 15.0 g/dL   HCT 57.836.2 46.936.0 - 62.946.0 %   MCV 89.4 80.0 - 100.0 fL   MCH 28.9 26.0 - 34.0 pg   MCHC 32.3 30.0 - 36.0 g/dL   RDW 52.814.2 41.311.5 - 24.415.5 %   Platelets 360 150 - 400 K/uL   nRBC 0.0 0.0 - 0.2 %   Neutrophils Relative % 58 %   Neutro Abs 6.4 1.7 - 7.7 K/uL   Lymphocytes Relative 30 %   Lymphs Abs 3.3 0.7 - 4.0 K/uL   Monocytes Relative 9 %   Monocytes Absolute 1.0 0.1 - 1.0  K/uL   Eosinophils Relative 2 %   Eosinophils Absolute 0.3 0.0 - 0.5 K/uL   Basophils Relative 1 %   Basophils Absolute 0.1 0.0 - 0.1 K/uL   Immature Granulocytes 0 %   Abs Immature Granulocytes 0.04 0.00 - 0.07 K/uL  Ethanol  Result Value Ref Range   Alcohol, Ethyl (B) <10 <10 mg/dL  Urine rapid drug screen (hosp performed)  Result Value Ref Range   Opiates NONE DETECTED NONE DETECTED   Cocaine NONE DETECTED NONE DETECTED   Benzodiazepines NONE DETECTED NONE DETECTED   Amphetamines NONE DETECTED NONE DETECTED   Tetrahydrocannabinol POSITIVE (A) NONE DETECTED   Barbiturates NONE DETECTED NONE DETECTED  Urinalysis, Routine w reflex microscopic  Result Value Ref Range   Color, Urine YELLOW YELLOW   APPearance HAZY (A) CLEAR   Specific Gravity, Urine 1.023 1.005 - 1.030   pH 5.0 5.0 - 8.0   Glucose, UA NEGATIVE NEGATIVE mg/dL   Hgb urine dipstick MODERATE (A) NEGATIVE  Bilirubin Urine NEGATIVE NEGATIVE   Ketones, ur NEGATIVE NEGATIVE mg/dL   Protein, ur 086 (A) NEGATIVE mg/dL   Nitrite NEGATIVE NEGATIVE   Leukocytes, UA NEGATIVE NEGATIVE   RBC / HPF 0-5 0 - 5 RBC/hpf   WBC, UA 0-5 0 - 5 WBC/hpf   Bacteria, UA NONE SEEN NONE SEEN   Squamous Epithelial / LPF 0-5 0 - 5   Mucus PRESENT   Magnesium  Result Value Ref Range   Magnesium 2.1 1.7 - 2.4 mg/dL    Laboratory interpretation all normal except severe hypokalemia   EKG EKG Interpretation  Date/Time:  Friday October 09 2018 01:12:36 EST Ventricular Rate:  102 PR Interval:    QRS Duration: 117 QT Interval:  357 QTC Calculation: 465 R Axis:   -14 Text Interpretation:  Sinus tachycardia Probable left atrial enlargement Nonspecific intraventricular conduction delay Artifact in lead(s) I II III aVR aVL V1 V2 Confirmed by Devoria Albe ([id number]) on 10/09/2018 1:14:44 AM   Radiology No results found.   Ct Abdomen Pelvis W Contrast  Result Date: 09/14/2018 CLINICAL DATA:  Left-sided abdominal pain and nausea with  vomiting for 4 days after colonoscopy.  IMPRESSION: No acute findings. Decreased size of tiny hiatal hernia. Electronically Signed   By: Myles Rosenthal M.D.   On: 09/14/2018 19:28    Procedures .Critical Care Performed by: Devoria Albe, MD Authorized by: Devoria Albe, MD   Critical care provider statement:    Critical care time (minutes):  31   Critical care was necessary to treat or prevent imminent or life-threatening deterioration of the following conditions:  Cardiac failure and endocrine crisis   Critical care was time spent personally by me on the following activities:  Discussions with consultants, examination of patient, obtaining history from patient or surrogate, ordering and review of laboratory studies, pulse oximetry, re-evaluation of patient's condition and review of old charts   (including critical care time)  Medications Ordered in ED Medications  famotidine (PEPCID) IVPB 20 mg premix (0 mg Intravenous Stopped 10/09/18 0149)  ondansetron (ZOFRAN) injection 4 mg (4 mg Intravenous Given 10/09/18 0105)  dicyclomine (BENTYL) injection 20 mg (20 mg Intramuscular Given 10/09/18 0105)  potassium chloride SA (K-DUR,KLOR-CON) CR tablet 40 mEq (40 mEq Oral Given 10/09/18 0105)  potassium chloride 10 mEq in 100 mL IVPB (10 mEq Intravenous New Bag/Given 10/09/18 0501)     Initial Impression / Assessment and Plan / ED Course  I have reviewed the triage vital signs and the nursing notes.  Pertinent labs & imaging results that were available during my care of the patient were reviewed by me and considered in my medical decision making (see chart for details).     Patient was given IV fluids, IV nausea medication, IV Pepcid since her pain is upper abdomen.  She also was given Bentyl IM.  Patient's potassium was called from the lab being low at 2.4.  EKG was done.  She was given both oral and IV potassium.  She is now on a monitor.    1:23 AM patient states she is about the same.  We  discussed her potassium being very low most likely from her hydrochlorothiazide for her blood pressure.  Her potassium is low enough she should be admitted and monitored until it is corrected.  I suspect some of her abdominal pain may be related to her low potassium.  Patient is agreeable to admission.  01:52 AM Dr Onalee Hua, hospitalist, will admit.   When I review patient's chart  she was just seen for abdominal pain in November and her CT scan was unrevealing.  She also had a CT of her abdomen/pelvis in Mar 04, 2018, January 11, 2015, and Feb 26, 2012 and several state left upper quadrant pain like she has today.  There was never any acute reason to explain her pain.  Final Clinical Impressions(s) / ED Diagnoses   Final diagnoses:  LUQ abdominal pain  Hypokalemia  Non-intractable vomiting with nausea, unspecified vomiting type    Plan admission  Devoria Albe, MD, Concha Pyo, MD 10/09/18 [phone]

## 2018-10-09 NOTE — Clinical Social Work Note (Signed)
Message left for patient's sister, Ms. Luciana AxeRankin, in regards to patient.  LCSW will explore family's ability to assist patient in PCP appointment maintenance due to patient's IDD diagnosis.  Number listed on the chart for patient's niece was not accepting calls.   Dartanyan Deasis, Juleen ChinaHeather D, LCSW

## 2018-10-09 NOTE — H&P (Signed)
History and Physical    Janet Peachina Loth UJW:[id number]:[id number] DOB: [birth year] DOA: 10/08/2018  PCP: Patient, No Pcp Per  Patient coming from: Home  Chief Complaint: Abdominal pain  HPI: Janet Mcguire is a 57 y.o. female with medical history significant of hypertension, anxiety, intellectual disability comes in with abdominal pain.  Patient has had frequent emergency room visits for various complaints.  Many have been due to epigastric abdominal pain.  It sounds like patient does not follow-up with primary care physician I think there may be some intellectual incapabilities here.  She reports she lives by herself.  She thinks that she may have been diagnosed with gastritis in the past but does not really know.  She does not really think she is ever had a EGD in the past.  She does occasionally vomit but does not have blood.  She denies any fevers or coughing.  She eats and drinks not as well as she should.  She really cannot tell me if eating relieves or makes her pain worse.  She does not take any over-the-counter medications except Tylenol.  Patient during her work-up here is found to have a potassium level of 2.4 and therefore referred for admission for her hypokalemia.  Review of Systems: As per HPI otherwise 10 point review of systems negative.   Past Medical History:  Diagnosis Date  . Allergy   . Anxiety   . Depression   . Hypertension   . Substance abuse (HCC)    "years ago"    Past Surgical History:  Procedure Laterality Date  . CHOLECYSTECTOMY    . COLONOSCOPY N/A 09/10/2018   Procedure: COLONOSCOPY;  Surgeon: Malissa Hippoehman, Najeeb U, MD;  Location: [facility];  Service: Endoscopy;  Laterality: N/A;  2:00  . ERCP N/A 01/12/2015   Procedure: ENDOSCOPIC RETROGRADE CHOLANGIOPANCREATOGRAPHY (ERCP) ;  Surgeon: Malissa HippoNajeeb U Rehman, MD;  Location: [facility] ORS;  Service: Endoscopy;  Laterality: N/A;  Balloon Extraction  . SPHINCTEROTOMY N/A 01/12/2015   Procedure: SPHINCTEROTOMY;  Surgeon: Malissa HippoNajeeb U Rehman, MD;   Location: [facility] ORS;  Service: Endoscopy;  Laterality: N/A;  . TUBAL LIGATION       reports that she has never smoked. She has never used smokeless tobacco. She reports previous alcohol use. She reports current drug use. Drug: Marijuana.  Allergies  Allergen Reactions  . Ibuprofen     Causes soreness to stomach, per pt.    Family History  Problem Relation Age of Onset  . Diabetes Mother   . Cancer Brother        throat  . COPD Sister   . Kidney disease Sister   . Diabetes Sister   . Diabetes Sister   . Kidney disease Sister     Prior to Admission medications   Medication Sig Start Date End Date Taking? Authorizing Provider  clotrimazole (GYNE-LOTRIMIN) 1 % vaginal cream Place 1 Applicatorful vaginally at bedtime. Patient taking differently: Place 1 Applicatorful vaginally at bedtime as needed (for irritation).  09/03/18   Federico FlakeNewton, Kimberly Niles, MD  diphenhydrAMINE (BENADRYL) 25 MG tablet Take 1 tablet (25 mg total) by mouth every 6 (six) hours. Patient taking differently: Take 25-50 mg by mouth every 6 (six) hours.  09/11/18   Sabas SousBero, Michael M, MD  docusate sodium (COLACE) 100 MG capsule Take 2 capsules (200 mg total) by mouth daily. 09/10/18   Rehman, Joline MaxcyNajeeb U, MD  hydrochlorothiazide (HYDRODIURIL) 25 MG tablet Take 25 mg by mouth daily.    [provider]  ondansetron (ZOFRAN ODT)  4 MG disintegrating tablet Take 1 tablet (4 mg total) by mouth every 8 (eight) hours as needed for nausea or vomiting. 09/14/18   Samuel JesterMcManus, Kathleen, DO  psyllium (METAMUCIL SMOOTH TEXTURE) 58.6 % powder Take 1 packet by mouth daily. 09/10/18   Malissa Hippoehman, Najeeb U, MD    Physical Exam: Vitals:   10/09/18 0200 10/09/18 0201 10/09/18 0259 10/09/18 0259  BP: 140/78   136/72  Pulse: 97   96  Resp: (!) 25 (!) 21  18  Temp:    98.4 F (36.9 C)  TempSrc:    Oral  SpO2: 100%   100%  Weight:   99.8 kg   Height:   6\' 1"  (1.854 m)       Constitutional: NAD, calm, comfortable Vitals:   10/09/18  0200 10/09/18 0201 10/09/18 0259 10/09/18 0259  BP: 140/78   136/72  Pulse: 97   96  Resp: (!) 25 (!) 21  18  Temp:    98.4 F (36.9 C)  TempSrc:    Oral  SpO2: 100%   100%  Weight:   99.8 kg   Height:   6\' 1"  (1.854 m)    Eyes: PERRL, lids and conjunctivae normal ENMT: Mucous membranes are moist. Posterior pharynx clear of any exudate or lesions.Normal dentition.  Neck: normal, supple, no masses, no thyromegaly Respiratory: clear to auscultation bilaterally, no wheezing, no crackles. Normal respiratory effort. No accessory muscle use.  Cardiovascular: Regular rate and rhythm, no murmurs / rubs / gallops. No extremity edema. 2+ pedal pulses. No carotid bruits.  Abdomen: no tenderness, no masses palpated. No hepatosplenomegaly. Bowel sounds positive.  Musculoskeletal: no clubbing / cyanosis. No joint deformity upper and lower extremities. Good ROM, no contractures. Normal muscle tone.  Skin: no rashes, lesions, ulcers. No induration Neurologic: CN 2-12 grossly intact. Sensation intact, DTR normal. Strength 5/5 in all 4.  Psychiatric: Normal judgment and insight. Alert and oriented x 3. Normal mood.    Labs on Admission: I have personally reviewed following labs and imaging studies  CBC: Recent Labs  Lab 10/09/18 0018  WBC 11.0*  NEUTROABS 6.4  HGB 11.7*  HCT 36.2  MCV 89.4  PLT 360   Basic Metabolic Panel: Recent Labs  Lab 10/09/18 0018  NA 138  K 2.4*  CL 103  CO2 25  GLUCOSE 132*  BUN 13  CREATININE 0.80  CALCIUM 9.2  MG 2.1   GFR: Estimated Creatinine Clearance: 106.9 mL/min (by C-G formula based on SCr of 0.8 mg/dL). Liver Function Tests: Recent Labs  Lab 10/09/18 0018  AST 27  ALT 37  ALKPHOS 87  BILITOT 0.6  PROT 8.1  ALBUMIN 3.9   Recent Labs  Lab 10/09/18 0018  LIPASE 29   No results for input(s): AMMONIA in the last 168 hours. Coagulation Profile: No results for input(s): INR, PROTIME in the last 168 hours. Cardiac Enzymes: No results  for input(s): CKTOTAL, CKMB, CKMBINDEX, TROPONINI in the last 168 hours. BNP (last 3 results) No results for input(s): PROBNP in the last 8760 hours. HbA1C: No results for input(s): HGBA1C in the last 72 hours. CBG: No results for input(s): GLUCAP in the last 168 hours. Lipid Profile: No results for input(s): CHOL, HDL, LDLCALC, TRIG, CHOLHDL, LDLDIRECT in the last 72 hours. Thyroid Function Tests: No results for input(s): TSH, T4TOTAL, FREET4, T3FREE, THYROIDAB in the last 72 hours. Anemia Panel: No results for input(s): VITAMINB12, FOLATE, FERRITIN, TIBC, IRON, RETICCTPCT in the last 72 hours. Urine analysis:  Component Value Date/Time   COLORURINE YELLOW 10/08/2018 2356   APPEARANCEUR HAZY (A) 10/08/2018 2356   LABSPEC 1.023 10/08/2018 2356   PHURINE 5.0 10/08/2018 2356   GLUCOSEU NEGATIVE 10/08/2018 2356   HGBUR MODERATE (A) 10/08/2018 2356   BILIRUBINUR NEGATIVE 10/08/2018 2356   BILIRUBINUR small 06/27/2017 1256   KETONESUR NEGATIVE 10/08/2018 2356   PROTEINUR 100 (A) 10/08/2018 2356   UROBILINOGEN 1.0 06/27/2017 1256   UROBILINOGEN 1.0 08/17/2015 0933   NITRITE NEGATIVE 10/08/2018 2356   LEUKOCYTESUR NEGATIVE 10/08/2018 2356   Sepsis Labs: !!!!!!!!!!!!!!!!!!!!!!!!!!!!!!!!!!!!!!!!!!!! @LABRCNTIP (procalcitonin:4,lacticidven:4) )No results found for this or any previous visit (from the past 240 hour(s)).   Radiological Exams on Admission: No results found.  Old chart reviewed Case discussed with Dr. Lynelle Doctor in the ED  Assessment/Plan 57 year old female with abdominal pain incidentally found to have hypokalemia Principal Problem:   Hypokalemia-magnesium level is normal.  Placed on KCl 40 mg p.o. every 4 hours x3.  Getting KCl 10 mEq IV x4.  Repeat potassium level in the morning. Observe overnight on telemetry monitoring.  Active Problems:   Essential hypertension-stable   Mental impairment-noted may be impairing her follow-up.  Change social work    Abdominal  pain, chronic, epigastric-start empirically on a PPI twice a day abdominal exam is benign     DVT prophylaxis: SCDs Code Status: Full Family Communication: None Disposition Plan: Later today after potassium repleted Consults called: Social work Admission status: Observation   Oronde Hallenbeck A MD [facility]  If 7PM-7AM, please contact night-coverage [email or web address] Password [email or web address]  10/09/2018, 3:12 AM

## 2018-10-09 NOTE — ED Notes (Signed)
Date and time results received: 10/09/18 1:01 AM  (use smartphrase ".now" to insert current time)  Test: k+ Critical Value: 2.4  Name of Provider Notified: knapp  Orders Received? Or Actions Taken?: see orders

## 2018-10-10 DIAGNOSIS — E876 Hypokalemia: Secondary | ICD-10-CM | POA: Diagnosis not present

## 2018-10-10 LAB — BASIC METABOLIC PANEL
Anion gap: 5 (ref 5–15)
BUN: 8 mg/dL (ref 6–20)
CALCIUM: 9 mg/dL (ref 8.9–10.3)
CO2: 25 mmol/L (ref 22–32)
Chloride: 113 mmol/L — ABNORMAL HIGH (ref 98–111)
Creatinine, Ser: 0.74 mg/dL (ref 0.44–1.00)
GFR calc Af Amer: >60 mL/min (ref >60)
GFR calc non Af Amer: >60 mL/min (ref >60)
Glucose, Bld: 107 mg/dL — ABNORMAL HIGH (ref 70–99)
Potassium: 3.8 mmol/L (ref 3.5–5.1)
SODIUM: 143 mmol/L (ref 135–145)

## 2018-10-10 MED ORDER — POLYETHYLENE GLYCOL 3350 17 G PO PACK
17.0000 g | PACK | Freq: Every day | ORAL | Status: DC
  Administered 2018-10-10: 17 g via ORAL
  Filled 2018-10-10: qty 1

## 2018-10-10 NOTE — Discharge Summary (Signed)
Physician Discharge Summary  Janet Mcguire ZOX:[id number] DOB: 1960/10/26 DOA: 10/08/2018  PCP: Patient, No Pcp Per  Admit date: 10/08/2018  Discharge date: 10/10/2018  Admitted From:Home  Disposition:  Home  Recommendations for Outpatient Follow-up:  1. Follow up with PCP in 1-2 weeks; patient states that she has appointment with her physician in [place] on January 7 2. Will need BP check at next visit along with repeat BMP as HCTZ has been removed due to potential for hypokalemia 3. Discussed avoidance of THC  Home Health: None  Equipment/Devices: None  Discharge Condition: Stable  CODE STATUS: Full  Diet recommendation: Heart Healthy  Brief/Interim Summary: Per HPI:  Janet Fermoina Herbinis a 57 y.o.femalewith medical history significant ofhypertension, anxiety, intellectual disability comes in with abdominal pain. Patient has had frequent emergency room visits for various complaints. Many have been due to epigastric abdominal pain. It sounds like patient does not follow-up with primary care physician I think there may be some intellectual incapabilities here. She reports she lives by herself. She thinks that she may have been diagnosed with gastritis in the past but does not really know. She does not really think she is ever had a EGD in the past. She does occasionally vomit but does not have blood. She denies any fevers or coughing. She eats and drinks not as well as she should. She really cannot tell me if eating relieves or makes her pain worse. She does not take any over-the-counter medications except Tylenol. Patient during her work-up here is found to have a potassium level of 2.4 and therefore referred for admission for her hypokalemia.  Her potassium has normalized after repletion and is 3.8 on day of discharge.  She does not require any supplementation, however her HCTZ has been discontinued as this appears to be the culprit along with some ongoing nausea and  vomiting related to cannabis hyperemesis.  She has been counseled on stopping use of cannabis and will hold off on use of HCTZ as her blood pressures appear to be soft anyhow.  She will need a repeat blood pressure check as well as BMP at her next office visit which is apparently scheduled for January 7 per the patient.  Clinical social work was consulted to assist patient with PCP arrangements, however social workers could not get in touch with other family members.  No other acute events have been noted throughout the course of her admission.  Discharge Diagnoses:  Principal Problem:   Hypokalemia Active Problems:   Essential hypertension   Mental impairment   Abdominal pain, chronic, epigastric  Principal discharge diagnosis: Hypokalemia with HCTZ use and cannabis hyperemesis syndrome.  Discharge Instructions  Discharge Instructions    Diet - low sodium heart healthy   Complete by:  As directed    Increase activity slowly   Complete by:  As directed      Allergies as of 10/10/2018      Reactions   Ibuprofen    Causes soreness to stomach, per pt.      Medication List    STOP taking these medications   hydrochlorothiazide 25 MG tablet Commonly known as:  HYDRODIURIL     TAKE these medications   clotrimazole 1 % vaginal cream Commonly known as:  GYNE-LOTRIMIN Place 1 Applicatorful vaginally at bedtime.   diphenhydrAMINE 25 MG tablet Commonly known as:  BENADRYL Take 1 tablet (25 mg total) by mouth every 6 (six) hours. What changed:  how much to take   docusate sodium 100 MG capsule  Commonly known as:  COLACE Take 2 capsules (200 mg total) by mouth daily.   NP THYROID 30 MG tablet Generic drug:  thyroid Take 1 tablet by mouth daily.   ondansetron 4 MG disintegrating tablet Commonly known as:  ZOFRAN ODT Take 1 tablet (4 mg total) by mouth every 8 (eight) hours as needed for nausea or vomiting.   psyllium 58.6 % powder Commonly known as:  METAMUCIL SMOOTH  TEXTURE Take 1 packet by mouth daily.      Follow-up Information    pcp Follow up in 2 week(s).   Why:  Appointment in [place] on Jan 7th per patient.         Allergies  Allergen Reactions  . Ibuprofen     Causes soreness to stomach, per pt.    Consultations:  Social work   Procedures/Studies: Dg Chest 2 View  Result Date: 09/14/2018 CLINICAL DATA:  Nausea and vomiting with abdominal pain. Colonoscopy on Thursday. EXAM: CHEST - 2 VIEW COMPARISON:  01/31/2017 radiographs FINDINGS: No findings free intraperitoneal gas beneath the hemidiaphragms. The lungs appear clear.  Cardiac and mediastinal contours normal. No pleural effusion identified. Thoracic spondylosis, attributable for the densities projecting over the thoracic spine on the lateral projection. IMPRESSION: 1.  No active cardiopulmonary disease is radiographically apparent. 2. No free intraperitoneal gas beneath the hemidiaphragms. 3. Thoracic spondylosis. Electronically Signed   By: Gaylyn RongWalter  Liebkemann M.D.   On: 09/14/2018 19:33   Ct Abdomen Pelvis W Contrast  Result Date: 09/14/2018 CLINICAL DATA:  Left-sided abdominal pain and nausea with vomiting for 4 days after colonoscopy. EXAM: CT ABDOMEN AND PELVIS WITH CONTRAST TECHNIQUE: Multidetector CT imaging of the abdomen and pelvis was performed using the standard protocol following bolus administration of intravenous contrast. CONTRAST:  100mL ISOVUE-300 IOPAMIDOL (ISOVUE-300) INJECTION 61% COMPARISON:  03/04/2018 FINDINGS: Lower Chest: No acute findings. Hepatobiliary: No hepatic masses identified. Prior cholecystectomy. No evidence of biliary obstruction. Pancreas:  No mass or inflammatory changes. Spleen: Within normal limits in size and appearance. Adrenals/Urinary Tract: No masses identified. No evidence of hydronephrosis. Unremarkable unopacified urinary bladder. Stomach/Bowel: Tiny hiatal hernia, decreased in size since previous study. No evidence of obstruction,  inflammatory process or abnormal fluid collections. No evidence of free intraperitoneal air. Normal appendix visualized. Vascular/Lymphatic: No pathologically enlarged lymph nodes. No abdominal aortic aneurysm. Reproductive:  No mass or other significant abnormality. Other:  None. Musculoskeletal:  No suspicious bone lesions identified. IMPRESSION: No acute findings. Decreased size of tiny hiatal hernia. Electronically Signed   By: Myles RosenthalJohn  Stahl M.D.   On: 09/14/2018 19:28     Discharge Exam: Vitals:   10/09/18 2137 10/10/18 0536  BP: (!) 96/43 90/70  Pulse: 78 87  Resp: 18 18  Temp: 98.4 F (36.9 C) 98.1 F (36.7 C)  SpO2: 100% 100%   Vitals:   10/09/18 0641 10/09/18 1313 10/09/18 2137 10/10/18 0536  BP: 127/81 115/75 (!) 96/43 90/70  Pulse: (!) 108 94 78 87  Resp: 18 18 18 18   Temp: 98.5 F (36.9 C) 98.1 F (36.7 C) 98.4 F (36.9 C) 98.1 F (36.7 C)  TempSrc: Oral  Oral Oral  SpO2: 100% 100% 100% 100%  Weight:      Height:        General: Pt is alert, awake, not in acute distress Cardiovascular: RRR, S1/S2 +, no rubs, no gallops Respiratory: CTA bilaterally, no wheezing, no rhonchi Abdominal: Soft, NT, ND, bowel sounds + Extremities: no edema, no cyanosis    The results of  significant diagnostics from this hospitalization (including imaging, microbiology, ancillary and laboratory) are listed below for reference.     Microbiology: No results found for this or any previous visit (from the past 240 hour(s)).   Labs: BNP (last 3 results) No results for input(s): BNP in the last 8760 hours. Basic Metabolic Panel: Recent Labs  Lab 10/09/18 0018 10/09/18 0512 10/10/18 0625  NA 138 141 143  K 2.4* 3.7 3.8  CL 103 112* 113*  CO2 25 24 25   GLUCOSE 132* 114* 107*  BUN 13 9 8   CREATININE 0.80 0.71 0.74  CALCIUM 9.2 8.4* 9.0  MG 2.1 2.0  --    Liver Function Tests: Recent Labs  Lab 10/09/18 0018  AST 27  ALT 37  ALKPHOS 87  BILITOT 0.6  PROT 8.1  ALBUMIN  3.9   Recent Labs  Lab 10/09/18 0018  LIPASE 29   No results for input(s): AMMONIA in the last 168 hours. CBC: Recent Labs  Lab 10/09/18 0018 10/09/18 0512  WBC 11.0* 9.9  NEUTROABS 6.4  --   HGB 11.7* 10.4*  HCT 36.2 33.2*  MCV 89.4 89.7  PLT 360 326   Cardiac Enzymes: No results for input(s): CKTOTAL, CKMB, CKMBINDEX, TROPONINI in the last 168 hours. BNP: Invalid input(s): POCBNP CBG: No results for input(s): GLUCAP in the last 168 hours. D-Dimer No results for input(s): DDIMER in the last 72 hours. Hgb A1c No results for input(s): HGBA1C in the last 72 hours. Lipid Profile No results for input(s): CHOL, HDL, LDLCALC, TRIG, CHOLHDL, LDLDIRECT in the last 72 hours. Thyroid function studies No results for input(s): TSH, T4TOTAL, T3FREE, THYROIDAB in the last 72 hours.  Invalid input(s): FREET3 Anemia work up No results for input(s): VITAMINB12, FOLATE, FERRITIN, TIBC, IRON, RETICCTPCT in the last 72 hours. Urinalysis    Component Value Date/Time   COLORURINE YELLOW 10/08/2018 2356   APPEARANCEUR HAZY (A) 10/08/2018 2356   LABSPEC 1.023 10/08/2018 2356   PHURINE 5.0 10/08/2018 2356   GLUCOSEU NEGATIVE 10/08/2018 2356   HGBUR MODERATE (A) 10/08/2018 2356   BILIRUBINUR NEGATIVE 10/08/2018 2356   BILIRUBINUR small 06/27/2017 1256   KETONESUR NEGATIVE 10/08/2018 2356   PROTEINUR 100 (A) 10/08/2018 2356   UROBILINOGEN 1.0 06/27/2017 1256   UROBILINOGEN 1.0 08/17/2015 0933   NITRITE NEGATIVE 10/08/2018 2356   LEUKOCYTESUR NEGATIVE 10/08/2018 2356   Sepsis Labs Invalid input(s): PROCALCITONIN,  WBC,  LACTICIDVEN Microbiology No results found for this or any previous visit (from the past 240 hour(s)).   Time coordinating discharge: 35 minutes  SIGNED:   Erick Blinks, DO [facility] 10/10/2018, 9:43 AM Pager [phone]  If 7PM-7AM, please contact night-coverage [email or web address] Password [email or web address]

## 2018-10-10 NOTE — Progress Notes (Signed)
Patient's IV catheter removed and intact. Pt's IV site clean dry and intact. Discharge instructions including medications and follow up appointments were reviewed and discussed with patient. All questions were answered and no further questions at this time.. Pt in stable condition and in no acute distress at time of discharge. Pt will be escorted by nurse tech.  

## 2018-10-28 ENCOUNTER — Other Ambulatory Visit (HOSPITAL_COMMUNITY): Payer: Self-pay | Admitting: Family Medicine

## 2018-10-28 DIAGNOSIS — Z1231 Encounter for screening mammogram for malignant neoplasm of breast: Secondary | ICD-10-CM

## 2018-11-02 ENCOUNTER — Other Ambulatory Visit: Payer: Medicaid Other | Admitting: Family Medicine

## 2018-11-11 ENCOUNTER — Other Ambulatory Visit: Payer: Medicaid Other | Admitting: Women's Health

## 2018-12-03 ENCOUNTER — Ambulatory Visit (HOSPITAL_COMMUNITY): Payer: Self-pay

## 2018-12-13 ENCOUNTER — Encounter (HOSPITAL_COMMUNITY): Payer: Self-pay | Admitting: *Deleted

## 2018-12-13 ENCOUNTER — Other Ambulatory Visit: Payer: Self-pay

## 2018-12-13 ENCOUNTER — Emergency Department (HOSPITAL_COMMUNITY)
Admission: EM | Admit: 2018-12-13 | Discharge: 2018-12-13 | Disposition: A | Discharge status: Home / Self Care | Payer: Medicaid Other | Attending: Emergency Medicine | Admitting: Emergency Medicine

## 2018-12-13 DIAGNOSIS — Z79899 Other long term (current) drug therapy: Secondary | ICD-10-CM | POA: Diagnosis not present

## 2018-12-13 DIAGNOSIS — R21 Rash and other nonspecific skin eruption: Secondary | ICD-10-CM | POA: Insufficient documentation

## 2018-12-13 HISTORY — DX: Other specified diseases of anus and rectum: K62.89

## 2018-12-13 MED ORDER — HYDROXYZINE HCL 25 MG PO TABS: 25.0000 mg | ORAL_TABLET | Freq: Four times a day (QID) | ORAL | 0 refills | Status: DC | PRN

## 2018-12-13 NOTE — ED Triage Notes (Signed)
Pt c/o rash to chest area that started a few days ago,

## 2018-12-13 NOTE — ED Provider Notes (Signed)
[facility] EMERGENCY DEPARTMENT Provider Note   CSN: [id number] Arrival date & time: 12/13/18  8563    History   Chief Complaint Chief Complaint  Patient presents with  . Rash    HPI Janet Mcguire is a 58 y.o. female.      Rash  Pt was seen at 0710. Per pt, c/o gradual onset and persistence of constant "rash" to mid-chest that began a few days ago. Pt states she has been scratching it. Pt has not taken any medications to treat her symptoms. Denies injury, no fevers, no redness, no bruising, no drainage, no other areas of rash.    Past Medical History:  Diagnosis Date  . Allergy   . Anxiety   . Depression   . Hypertension   . Rectal irritation    chronic  . Substance abuse (HCC)    "years ago"    Patient Active Problem List   Diagnosis Date Noted  . Hypokalemia 10/09/2018  . Abdominal pain, chronic, epigastric 10/09/2018  . Rectal bleeding 07/08/2018  . Vitamin D deficiency 09/17/2016  . Literacy level of illiterate 09/16/2016  . Mental impairment 09/16/2016  . Colonoscopy refused 09/16/2016  . Elevated liver enzymes 01/11/2015  . Essential hypertension 01/11/2015  . Depression 01/11/2015  . History of cholecystectomy 01/11/2015    Past Surgical History:  Procedure Laterality Date  . CHOLECYSTECTOMY    . COLONOSCOPY N/A 09/10/2018   Procedure: COLONOSCOPY;  Surgeon: Malissa Hippo, MD;  Location: [facility] ENDO SUITE;  Service: Endoscopy;  Laterality: N/A;  2:00  . ERCP N/A 01/12/2015   Procedure: ENDOSCOPIC RETROGRADE CHOLANGIOPANCREATOGRAPHY (ERCP) ;  Surgeon: Malissa Hippo, MD;  Location: [facility] ORS;  Service: Endoscopy;  Laterality: N/A;  Balloon Extraction  . SPHINCTEROTOMY N/A 01/12/2015   Procedure: SPHINCTEROTOMY;  Surgeon: Malissa Hippo, MD;  Location: [facility] ORS;  Service: Endoscopy;  Laterality: N/A;  . TUBAL LIGATION       OB History    Gravida  3   Para  1   Term  1   Preterm      AB  2   Living  1     SAB  2   TAB  0   Ectopic      Multiple      Live Births  1            Home Medications    Prior to Admission medications   Medication Sig Start Date End Date Taking? Authorizing Provider  clotrimazole (GYNE-LOTRIMIN) 1 % vaginal cream Place 1 Applicatorful vaginally at bedtime. Patient not taking: Reported on 10/09/2018 09/03/18   Federico Flake, MD  diphenhydrAMINE (BENADRYL) 25 MG tablet Take 1 tablet (25 mg total) by mouth every 6 (six) hours. Patient taking differently: Take 25-50 mg by mouth every 6 (six) hours.  09/11/18   Sabas Sous, MD  docusate sodium (COLACE) 100 MG capsule Take 2 capsules (200 mg total) by mouth daily. 09/10/18   Rehman, Joline Maxcy, MD  ondansetron (ZOFRAN ODT) 4 MG disintegrating tablet Take 1 tablet (4 mg total) by mouth every 8 (eight) hours as needed for nausea or vomiting. Patient not taking: Reported on 10/09/2018 09/14/18   Samuel Jester, DO  psyllium (METAMUCIL SMOOTH TEXTURE) 58.6 % powder Take 1 packet by mouth daily. Patient not taking: Reported on 10/09/2018 09/10/18   Malissa Hippo, MD  thyroid (NP THYROID) 30 MG tablet Take 1 tablet by mouth daily. 09/21/18   [provider]  Family History Family History  Problem Relation Age of Onset  . Diabetes Mother   . Cancer Brother        throat  . COPD Sister   . Kidney disease Sister   . Diabetes Sister   . Diabetes Sister   . Kidney disease Sister     Social History Social History   Tobacco Use  . Smoking status: Never Smoker  . Smokeless tobacco: Never Used  Substance Use Topics  . Alcohol use: Not Currently  . Drug use: Yes    Types: Marijuana    Comment: last use Tuesday      Allergies   Ibuprofen   Review of Systems Review of Systems  Skin: Positive for rash.  ROS: Statement: All systems negative except as marked or noted in the HPI; Constitutional: Negative for fever and chills. ; ; Eyes: Negative for eye pain, redness and discharge. ; ; ENMT: Negative for ear pain,  hoarseness, nasal congestion, sinus pressure and sore throat. ; ; Cardiovascular: Negative for chest pain, palpitations, diaphoresis, dyspnea and peripheral edema. ; ; Respiratory: Negative for cough, wheezing and stridor. ; ; Gastrointestinal: Negative for nausea, vomiting, diarrhea, abdominal pain, blood in stool, hematemesis, jaundice and rectal bleeding. . ; ; Genitourinary: Negative for dysuria, flank pain and hematuria. ; ; Musculoskeletal: Negative for back pain and neck pain. Negative for swelling and trauma.; ; Skin: +rash. Negative for abrasions, blisters, bruising and skin lesion.; ; Neuro: Negative for headache, lightheadedness and neck stiffness. Negative for weakness, altered level of consciousness, altered mental status, extremity weakness, paresthesias, involuntary movement, seizure and syncope.        Physical Exam Updated Vital Signs BP 132/72   Pulse 70   Temp 98.1 F (36.7 C) (Oral)   Resp 18   Ht  (1.854 m)   Wt 107 kg   LMP  (LMP Unknown) Comment: last  one years ago   SpO2 100%   BMI 31.14 kg/m   Physical Exam 0715: Physical examination:  Nursing notes reviewed; Vital signs and O2 SAT reviewed;  Constitutional: Well developed, Well nourished, Well hydrated, In no acute distress; Head:  Normocephalic, atraumatic; Eyes: EOMI, PERRL, No scleral icterus; ENMT: Mouth and pharynx normal, Mucous membranes moist; Neck: Supple, Full range of motion, No lymphadenopathy; Cardiovascular: Regular rate and rhythm, No gallop; Respiratory: Breath sounds clear & equal bilaterally, No wheezes.  Speaking full sentences with ease, Normal respiratory effort/excursion; Chest: Nontender, Movement normal. +small patch of dry flaking skin mid-sternal area that pt is scratching at during exam. No open wounds, no drainage, no erythema, no ecchymosis, no pustules/blisters, no hives.; Abdomen: Soft, Nontender, Nondistended, Normal bowel sounds; Genitourinary: No CVA tenderness; Extremities:  Peripheral pulses normal, No tenderness, No edema, No calf edema or asymmetry.; Neuro: AA&Ox3, Major CN grossly intact.  Speech clear. No gross focal motor or sensory deficits in extremities.; Skin: Color normal, Warm, Dry.   ED Treatments / Results  Labs (all labs ordered are listed, but only abnormal results are displayed)   EKG None  Radiology   Procedures Procedures (including critical care time)  Medications Ordered in ED Medications - No data to display   Initial Impression / Assessment and Plan / ED Course  I have reviewed the triage vital signs and the nursing notes.  Pertinent labs & imaging results that were available during my care of the patient were reviewed by me and considered in my medical decision making (see chart for details).  MDM Reviewed: previous chart, nursing note and vitals    0715:  Tx symptomatically for what appears to be dry/flaking skin. Does not appear cellulitic, allergic (hives), etc. Dx d/w pt.  Questions answered.  Verb understanding, agreeable to d/c home with outpt f/u.   Final Clinical Impressions(s) / ED Diagnoses   Final diagnoses:  None    ED Discharge Orders    None       Samuel Jester, DO 12/18/18 4081

## 2018-12-13 NOTE — Discharge Instructions (Addendum)
Take the prescription as directed.  Wash the area gently with soap and water, and pat dry, at least twice a day, and cover with a clean/dry dressing.  Change the dressing whenever it becomes wet or soiled after washing the area with soap and water and patting dry. Apply over the counter moisturizer, as directed on packaging, as needed for dry skin. Call your regular medical doctor tomorrow to schedule a follow up appointment this week.  Return to the Emergency Department immediately if worsening.

## 2018-12-17 ENCOUNTER — Ambulatory Visit (HOSPITAL_COMMUNITY): Payer: Self-pay

## 2018-12-28 ENCOUNTER — Emergency Department (HOSPITAL_COMMUNITY): Payer: Medicaid Other

## 2018-12-28 ENCOUNTER — Other Ambulatory Visit: Payer: Self-pay

## 2018-12-28 ENCOUNTER — Encounter (HOSPITAL_COMMUNITY): Payer: Self-pay | Admitting: Emergency Medicine

## 2018-12-28 ENCOUNTER — Observation Stay (HOSPITAL_COMMUNITY)
Admission: EM | Admit: 2018-12-28 | Discharge: 2018-12-29 | Disposition: A | Discharge status: Home / Self Care | Payer: Medicaid Other | Attending: Internal Medicine | Admitting: Internal Medicine

## 2018-12-28 DIAGNOSIS — E079 Disorder of thyroid, unspecified: Secondary | ICD-10-CM

## 2018-12-28 DIAGNOSIS — R0600 Dyspnea, unspecified: Secondary | ICD-10-CM | POA: Diagnosis not present

## 2018-12-28 DIAGNOSIS — R0602 Shortness of breath: Secondary | ICD-10-CM | POA: Diagnosis present

## 2018-12-28 DIAGNOSIS — E039 Hypothyroidism, unspecified: Secondary | ICD-10-CM | POA: Insufficient documentation

## 2018-12-28 DIAGNOSIS — K219 Gastro-esophageal reflux disease without esophagitis: Secondary | ICD-10-CM | POA: Insufficient documentation

## 2018-12-28 DIAGNOSIS — I10 Essential (primary) hypertension: Secondary | ICD-10-CM | POA: Diagnosis not present

## 2018-12-28 DIAGNOSIS — R0609 Other forms of dyspnea: Principal | ICD-10-CM | POA: Insufficient documentation

## 2018-12-28 DIAGNOSIS — E876 Hypokalemia: Secondary | ICD-10-CM | POA: Diagnosis not present

## 2018-12-28 DIAGNOSIS — Z79899 Other long term (current) drug therapy: Secondary | ICD-10-CM | POA: Insufficient documentation

## 2018-12-28 DIAGNOSIS — F329 Major depressive disorder, single episode, unspecified: Secondary | ICD-10-CM | POA: Insufficient documentation

## 2018-12-28 HISTORY — DX: Gastro-esophageal reflux disease without esophagitis: K21.9

## 2018-12-28 LAB — D-DIMER, QUANTITATIVE: D-Dimer, Quant: 0.64 ug/mL-FEU — ABNORMAL HIGH (ref 0.00–0.50)

## 2018-12-28 LAB — COMPREHENSIVE METABOLIC PANEL
ALBUMIN: 3.8 g/dL (ref 3.5–5.0)
ALT: 18 U/L (ref 0–44)
AST: 17 U/L (ref 15–41)
Alkaline Phosphatase: 87 U/L (ref 38–126)
Anion gap: 9 (ref 5–15)
BUN: 11 mg/dL (ref 6–20)
CO2: 23 mmol/L (ref 22–32)
Calcium: 8.7 mg/dL — ABNORMAL LOW (ref 8.9–10.3)
Chloride: 107 mmol/L (ref 98–111)
Creatinine, Ser: 0.84 mg/dL (ref 0.44–1.00)
GFR calc Af Amer: >60 mL/min (ref >60)
Glucose, Bld: 110 mg/dL — ABNORMAL HIGH (ref 70–99)
Potassium: 3.2 mmol/L — ABNORMAL LOW (ref 3.5–5.1)
Sodium: 139 mmol/L (ref 135–145)
Total Bilirubin: 0.5 mg/dL (ref 0.3–1.2)
Total Protein: 7.3 g/dL (ref 6.5–8.1)

## 2018-12-28 LAB — CBC WITH DIFFERENTIAL/PLATELET
ABS IMMATURE GRANULOCYTES: 0.03 10*3/uL (ref 0.00–0.07)
Basophils Absolute: 0.1 10*3/uL (ref 0.0–0.1)
Basophils Relative: 1 %
Eosinophils Absolute: 0.3 10*3/uL (ref 0.0–0.5)
Eosinophils Relative: 3 %
HCT: 35.6 % — ABNORMAL LOW (ref 36.0–46.0)
Hemoglobin: 10.9 g/dL — ABNORMAL LOW (ref 12.0–15.0)
Immature Granulocytes: 0 %
LYMPHS ABS: 3.9 10*3/uL (ref 0.7–4.0)
Lymphocytes Relative: 41 %
MCH: 27.6 pg (ref 26.0–34.0)
MCHC: 30.6 g/dL (ref 30.0–36.0)
MCV: 90.1 fL (ref 80.0–100.0)
Monocytes Absolute: 0.7 10*3/uL (ref 0.1–1.0)
Monocytes Relative: 8 %
NEUTROS ABS: 4.5 10*3/uL (ref 1.7–7.7)
Neutrophils Relative %: 47 %
Platelets: 326 10*3/uL (ref 150–400)
RBC: 3.95 MIL/uL (ref 3.87–5.11)
RDW: 15.4 % (ref 11.5–15.5)
WBC: 9.6 10*3/uL (ref 4.0–10.5)
nRBC: 0 % (ref 0.0–0.2)

## 2018-12-28 LAB — TROPONIN I: Troponin I: 0.09 ng/mL (ref <0.03)

## 2018-12-28 MED ORDER — LORAZEPAM 1 MG PO TABS
1.0000 mg | ORAL_TABLET | Freq: Once | ORAL | Status: AC
  Administered 2018-12-28: 1 mg via ORAL
  Filled 2018-12-28: qty 1

## 2018-12-28 MED ORDER — ASPIRIN 325 MG PO TABS
325.0000 mg | ORAL_TABLET | Freq: Once | ORAL | Status: AC
  Administered 2018-12-29: 325 mg via ORAL
  Filled 2018-12-28: qty 1

## 2018-12-28 MED ORDER — IOHEXOL 350 MG/ML SOLN
100.0000 mL | Freq: Once | INTRAVENOUS | Status: AC | PRN
  Administered 2018-12-28: 100 mL via INTRAVENOUS

## 2018-12-28 MED ORDER — POTASSIUM CHLORIDE CRYS ER 20 MEQ PO TBCR
40.0000 meq | EXTENDED_RELEASE_TABLET | Freq: Once | ORAL | Status: AC
  Administered 2018-12-28: 40 meq via ORAL
  Filled 2018-12-28: qty 2

## 2018-12-28 NOTE — ED Provider Notes (Signed)
[facility] EMERGENCY DEPARTMENT Provider Note   CSN: [id number] Arrival date & time: 12/28/18  1540    History   Chief Complaint Chief Complaint  Patient presents with  . Shortness of Breath    HPI Janet Mcguire is a 58 y.o. female.     Patient states that she became short of breath when she got up to do the dishes.  And she is remained short of breath no chest pain no sweating  The history is provided by the patient. No language interpreter was used.  Shortness of Breath  Severity:  Moderate Onset quality:  Sudden Timing:  Constant Progression:  Worsening Chronicity:  New Context: activity   Relieved by:  Nothing Worsened by:  Nothing Ineffective treatments:  None tried Associated symptoms: no abdominal pain, no chest pain, no cough, no headaches and no rash     Past Medical History:  Diagnosis Date  . Allergy   . Anxiety   . Depression   . Hypertension   . Rectal irritation    chronic  . Substance abuse (HCC)    "years ago"    Patient Active Problem List   Diagnosis Date Noted  . Dyspnea 12/28/2018  . Hypokalemia 10/09/2018  . Abdominal pain, chronic, epigastric 10/09/2018  . Rectal bleeding 07/08/2018  . Vitamin D deficiency 09/17/2016  . Literacy level of illiterate 09/16/2016  . Mental impairment 09/16/2016  . Colonoscopy refused 09/16/2016  . Elevated liver enzymes 01/11/2015  . Essential hypertension 01/11/2015  . Depression 01/11/2015  . History of cholecystectomy 01/11/2015    Past Surgical History:  Procedure Laterality Date  . CHOLECYSTECTOMY    . COLONOSCOPY N/A 09/10/2018   Procedure: COLONOSCOPY;  Surgeon: Malissa Hippo, MD;  Location: [facility];  Service: Endoscopy;  Laterality: N/A;  2:00  . ERCP N/A 01/12/2015   Procedure: ENDOSCOPIC RETROGRADE CHOLANGIOPANCREATOGRAPHY (ERCP) ;  Surgeon: Malissa Hippo, MD;  Location: [facility] ORS;  Service: Endoscopy;  Laterality: N/A;  Balloon Extraction  . SPHINCTEROTOMY N/A 01/12/2015   Procedure: SPHINCTEROTOMY;  Surgeon: Malissa Hippo, MD;  Location: [facility] ORS;  Service: Endoscopy;  Laterality: N/A;  . TUBAL LIGATION       OB History    Gravida  3   Para  1   Term  1   Preterm      AB  2   Living  1     SAB  2   TAB  0   Ectopic      Multiple      Live Births  1            Home Medications    Prior to Admission medications   Medication Sig Start Date End Date Taking? Authorizing Provider  amLODipine (NORVASC) 10 MG tablet Take 10 mg by mouth daily.  12/01/18  Yes [provider]  citalopram (CELEXA) 10 MG tablet Take 20 mg by mouth daily.  12/01/18  Yes [provider]  diphenhydrAMINE (BENADRYL) 25 MG tablet Take 1 tablet (25 mg total) by mouth every 6 (six) hours. Patient taking differently: Take 25-50 mg by mouth every 6 (six) hours.  09/11/18  Yes Sabas Sous, MD  Estradiol 10 MCG TABS vaginal tablet Place 1 tablet vaginally 2 (two) times a week.  12/03/18  Yes [provider]  thyroid (NP THYROID) 30 MG tablet Take 1 tablet by mouth daily. 09/21/18  Yes [provider]  Vitamin D, Ergocalciferol, (DRISDOL) 1.25 MG (50000 UT) CAPS capsule  Take 50,000 Units by mouth every 7 (seven) days.    Yes [provider]  hydrOXYzine (ATARAX/VISTARIL) 25 MG tablet Take 1 tablet (25 mg total) by mouth every 6 (six) hours as needed for itching. Patient not taking: Reported on 12/28/2018 12/13/18   Samuel Jester, DO    Family History Family History  Problem Relation Age of Onset  . Diabetes Mother   . Cancer Brother        throat  . COPD Sister   . Kidney disease Sister   . Diabetes Sister   . Diabetes Sister   . Kidney disease Sister     Social History Social History   Tobacco Use  . Smoking status: Never Smoker  . Smokeless tobacco: Never Used  Substance Use Topics  . Alcohol use: Not Currently  . Drug use: Yes    Types: Marijuana    Comment: last use Tuesday      Allergies    Ibuprofen   Review of Systems Review of Systems  Constitutional: Negative for appetite change and fatigue.  HENT: Negative for congestion, ear discharge and sinus pressure.   Eyes: Negative for discharge.  Respiratory: Positive for shortness of breath. Negative for cough.   Cardiovascular: Negative for chest pain.  Gastrointestinal: Negative for abdominal pain and diarrhea.  Genitourinary: Negative for frequency and hematuria.  Musculoskeletal: Negative for back pain.  Skin: Negative for rash.  Neurological: Negative for seizures and headaches.  Psychiatric/Behavioral: Negative for hallucinations.     Physical Exam Updated Vital Signs BP (!) 142/92 (BP Location: Right Arm)   Pulse 72   Temp 97.8 F (36.6 C)   Resp 20   Ht  (1.854 m)   Wt 107 kg   LMP  (LMP Unknown) Comment: last  one years ago   SpO2 99%   BMI 31.14 kg/m   Physical Exam Vitals signs and nursing note reviewed.  Constitutional:      Appearance: She is well-developed.  HENT:     Head: Normocephalic.     Nose: Nose normal.  Eyes:     General: No scleral icterus.    Conjunctiva/sclera: Conjunctivae normal.  Neck:     Musculoskeletal: Neck supple.     Thyroid: No thyromegaly.  Cardiovascular:     Rate and Rhythm: Normal rate and regular rhythm.     Heart sounds: No murmur. No friction rub. No gallop.   Pulmonary:     Breath sounds: No stridor. No wheezing or rales.  Chest:     Chest wall: No tenderness.  Abdominal:     General: There is no distension.     Tenderness: There is no abdominal tenderness. There is no rebound.  Musculoskeletal: Normal range of motion.  Lymphadenopathy:     Cervical: No cervical adenopathy.  Skin:    Findings: No erythema or rash.  Neurological:     Mental Status: She is oriented to person, place, and time.     Motor: No abnormal muscle tone.     Coordination: Coordination normal.  Psychiatric:        Behavior: Behavior normal.      ED Treatments /  Results  Labs (all labs ordered are listed, but only abnormal results are displayed) Labs Reviewed  CBC WITH DIFFERENTIAL/PLATELET - Abnormal; Notable for the following components:      Result Value   Hemoglobin 10.9 (*)    HCT 35.6 (*)    All other components within normal limits  COMPREHENSIVE METABOLIC PANEL -  Abnormal; Notable for the following components:   Potassium 3.2 (*)    Glucose, Bld 110 (*)    Calcium 8.7 (*)    All other components within normal limits  D-DIMER, QUANTITATIVE (NOT AT [facility]) - Abnormal; Notable for the following components:   D-Dimer, Quant 0.64 (*)    All other components within normal limits  TROPONIN I - Abnormal; Notable for the following components:   Troponin I 0.09 (*)    All other components within normal limits    EKG EKG Interpretation  Date/Time:  Monday December 28 2018 19:20:20 EDT Ventricular Rate:  100 PR Interval:  148 QRS Duration: 88 QT Interval:  364 QTC Calculation: 469 R Axis:   7 Text Interpretation:  Normal sinus rhythm Normal ECG Confirmed by [name] [phone]) on 12/28/2018 8:06:10 PM Also confirmed by [name] [phone])  on 12/28/2018 8:56:50 PM   Radiology Dg Chest 2 View  Result Date: 12/28/2018 CLINICAL DATA:  Shortness of breath.  Cough. EXAM: CHEST - 2 VIEW COMPARISON:  September 14, 2018 FINDINGS: The heart size and mediastinal contours are within normal limits. Both lungs are clear. The visualized skeletal structures are unremarkable. IMPRESSION: No active cardiopulmonary disease. Electronically Signed   By: Gerome Sam III M.D   On: 12/28/2018 20:11   Ct Angio Chest Pe W And/or Wo Contrast  Result Date: 12/28/2018 CLINICAL DATA:  Shortness of breath.  Elevated D-dimer. EXAM: CT ANGIOGRAPHY CHEST WITH CONTRAST TECHNIQUE: Multidetector CT imaging of the chest was performed using the standard protocol during bolus administration of intravenous contrast. Multiplanar CT image reconstructions and MIPs were obtained to  evaluate the vascular anatomy. CONTRAST:  OMNIPAQUE IOHEXOL 350 MG/ML SOLN COMPARISON:  Radiography same day FINDINGS: Cardiovascular: Pulmonary arterial opacification is moderate to good. There are no pulmonary emboli. Heart size is normal. The aorta is normal. No coronary artery calcification is seen. Mediastinum/Nodes: No mediastinal or hilar mass or lymphadenopathy. There is a small hiatal hernia. Lungs/Pleura: The lungs are clear.  No pleural fluid. Upper Abdomen: Small hiatal hernia as noted above. Otherwise no significant finding. Previous cholecystectomy. Musculoskeletal: Ordinary mild degenerative changes. Review of the MIP images confirms the above findings. IMPRESSION: No pulmonary emboli or other acute chest pathology identified. Small hiatal hernia. Electronically Signed   By: Paulina Fusi M.D.   On: 12/28/2018 21:46    Procedures Procedures (including critical care time)  Medications Ordered in ED Medications  aspirin tablet 325 mg (has no administration in time range)  LORazepam (ATIVAN) tablet 1 mg (1 mg Oral Given 12/28/18 2123)  potassium chloride SA (K-DUR,KLOR-CON) CR tablet 40 mEq (40 mEq Oral Given 12/28/18 2123)  iohexol (OMNIPAQUE) 350 MG/ML injection 100 mL (100 mLs Intravenous Contrast Given 12/28/18 2131)     Initial Impression / Assessment and Plan / ED Course  I have reviewed the triage vital signs and the nursing notes.  Pertinent labs & imaging results that were available during my care of the patient were reviewed by me and considered in my medical decision making (see chart for details).       D-dimer elevated.  CT Angie of the chest negative.  Troponin elevated with no acute changes on EKG.  Patient symptoms improved with Ativan.  She will be admitted to medicine for observation for elevated troponin and shortness of breath  Final Clinical Impressions(s) / ED Diagnoses   Final diagnoses:  SOB (shortness of breath)    ED Discharge Orders    None  [name], MD 12/28/18 2216

## 2018-12-28 NOTE — ED Notes (Signed)
Patient transported to CT 

## 2018-12-28 NOTE — H&P (Signed)
[facility] H&P    Patient Demographics:    Janet Mcguire, is a 58 y.o. female  MRN: [id number]  DOB - 17-May-1961  Admit Date - 12/28/2018  Referring MD/NP/PA: Dr. Estell Harpin  Outpatient Primary MD for the patient is Joylene Draft, NP  Patient coming from: Home  Chief complaint-shortness of breath   HPI:    Janet Mcguire  is a 58 y.o. female, with history of hypertension, anxiety, depression came to hospital with worsening shortness of breath of 1 day duration.  Patient says that she became short of breath when she got up to do dishes.  She denies chest pain.  No sweating. Denies nausea vomiting or diarrhea. She does have hypertension and found to have elevated blood pressure in the ED Denies previous history of stroke or seizures. No history of cancer. Denies abdominal pain, no dysuria.    Review of systems:    In addition to the HPI above,   All other systems reviewed and are negative.    Past History of the following :    Past Medical History:  Diagnosis Date  . Allergy   . Anxiety   . Depression   . Hypertension   . Rectal irritation    chronic  . Substance abuse (HCC)    "years ago"      Past Surgical History:  Procedure Laterality Date  . CHOLECYSTECTOMY    . COLONOSCOPY N/A 09/10/2018   Procedure: COLONOSCOPY;  Surgeon: Malissa Hippo, MD;  Location: [facility];  Service: Endoscopy;  Laterality: N/A;  2:00  . ERCP N/A 01/12/2015   Procedure: ENDOSCOPIC RETROGRADE CHOLANGIOPANCREATOGRAPHY (ERCP) ;  Surgeon: Malissa Hippo, MD;  Location: [facility] ORS;  Service: Endoscopy;  Laterality: N/A;  Balloon Extraction  . SPHINCTEROTOMY N/A 01/12/2015   Procedure: SPHINCTEROTOMY;  Surgeon: Malissa Hippo, MD;  Location: [facility] ORS;  Service: Endoscopy;  Laterality: N/A;  . TUBAL LIGATION        Social History:      Social History   Tobacco Use  . Smoking status: Never Smoker  . Smokeless  tobacco: Never Used  Substance Use Topics  . Alcohol use: Not Currently       Family History :     Family History  Problem Relation Age of Onset  . Diabetes Mother   . Cancer Brother        throat  . COPD Sister   . Kidney disease Sister   . Diabetes Sister   . Diabetes Sister   . Kidney disease Sister       Home Medications:   Prior to Admission medications   Medication Sig Start Date End Date Taking? Authorizing Provider  amLODipine (NORVASC) 10 MG tablet Take 10 mg by mouth daily.  12/01/18  Yes [provider]  citalopram (CELEXA) 10 MG tablet Take 20 mg by mouth daily.  12/01/18  Yes [provider]  diphenhydrAMINE (BENADRYL) 25 MG tablet Take 1 tablet (25 mg total) by mouth every 6 (six) hours. Patient taking differently: Take 25-50 mg  by mouth every 6 (six) hours.  09/11/18  Yes Sabas Sous, MD  Estradiol 10 MCG TABS vaginal tablet Place 1 tablet vaginally 2 (two) times a week.  12/03/18  Yes [provider]  thyroid (NP THYROID) 30 MG tablet Take 1 tablet by mouth daily. 09/21/18  Yes [provider]  Vitamin D, Ergocalciferol, (DRISDOL) 1.25 MG (50000 UT) CAPS capsule Take 50,000 Units by mouth every 7 (seven) days.    Yes [provider]  hydrOXYzine (ATARAX/VISTARIL) 25 MG tablet Take 1 tablet (25 mg total) by mouth every 6 (six) hours as needed for itching. Patient not taking: Reported on 12/28/2018 12/13/18   Samuel Jester, DO     Allergies:     Allergies  Allergen Reactions  . Ibuprofen     Causes soreness to stomach, per pt.     Physical Exam:   Vitals  Blood pressure (!) 142/92, pulse 72, temperature 97.8 F (36.6 C), resp. rate 20, height  (1.854 m), weight 107 kg, SpO2 99 %.  1.  General: Appears in no acute distress  2. Psychiatric: Alert, oriented x3, intact insight and judgment  3. Neurologic: Cranial nerve II through XII grossly intact, no focal deficit noted  4. HEENMT:    Atraumatic normocephalic, extraocular muscles are intact, oral mucosa is moist  5. Respiratory : Clear to auscultation bilaterally  6. Cardiovascular : S1-S2, regular, no murmur rubs or gallops  7. Gastrointestinal:  Abdomen is soft, nontender, no organomegaly    Data Review:    CBC Recent Labs  Lab 12/28/18 2002  WBC 9.6  HGB 10.9*  HCT 35.6*  PLT 326  MCV 90.1  MCH 27.6  MCHC 30.6  RDW 15.4  LYMPHSABS 3.9  MONOABS 0.7  EOSABS 0.3  BASOSABS 0.1   ------------------------------------------------------------------------------------------------------------------  Results for orders placed or performed during the hospital encounter of 12/28/18 (from the past 48 hour(s))  CBC with Differential     Status: Abnormal   Collection Time: 12/28/18  8:02 PM  Result Value Ref Range   WBC 9.6 4.0 - 10.5 K/uL   RBC 3.95 3.87 - 5.11 MIL/uL   Hemoglobin 10.9 (L) 12.0 - 15.0 g/dL   HCT 16.1 (L) 09.6 - 04.5 %   MCV 90.1 80.0 - 100.0 fL   MCH 27.6 26.0 - 34.0 pg   MCHC 30.6 30.0 - 36.0 g/dL   RDW 40.9 81.1 - 91.4 %   Platelets 326 150 - 400 K/uL   nRBC 0.0 0.0 - 0.2 %   Neutrophils Relative % 47 %   Neutro Abs 4.5 1.7 - 7.7 K/uL   Lymphocytes Relative 41 %   Lymphs Abs 3.9 0.7 - 4.0 K/uL   Monocytes Relative 8 %   Monocytes Absolute 0.7 0.1 - 1.0 K/uL   Eosinophils Relative 3 %   Eosinophils Absolute 0.3 0.0 - 0.5 K/uL   Basophils Relative 1 %   Basophils Absolute 0.1 0.0 - 0.1 K/uL   Immature Granulocytes 0 %   Abs Immature Granulocytes 0.03 0.00 - 0.07 K/uL    Comment: Performed at [facility], [address], [place], [place] [address]  Comprehensive metabolic panel     Status: Abnormal   Collection Time: 12/28/18  8:02 PM  Result Value Ref Range   Sodium 139 135 - 145 mmol/L   Potassium 3.2 (L) 3.5 - 5.1 mmol/L   Chloride 107 98 - 111 mmol/L   CO2 23 22 - 32 mmol/L   Glucose, Bld  110 (H) 70 - 99 mg/dL   BUN 11 6 - 20 mg/dL   Creatinine, Ser 0.980.84 0.44 - 1.00  mg/dL   Calcium 8.7 (L) 8.9 - 10.3 mg/dL   Total Protein 7.3 6.5 - 8.1 g/dL   Albumin 3.8 3.5 - 5.0 g/dL   AST 17 15 - 41 U/L   ALT 18 0 - 44 U/L   Alkaline Phosphatase 87 38 - 126 U/L   Total Bilirubin 0.5 0.3 - 1.2 mg/dL   GFR calc non Af Amer >60 >60 mL/min   GFR calc Af Amer >60 >60 mL/min   Anion gap 9 5 - 15    Comment: Performed at [facility], [address] [place], [place] [address]  D-dimer, quantitative (not at [facility])     Status: Abnormal   Collection Time: 12/28/18  8:02 PM  Result Value Ref Range   D-Dimer, Quant 0.64 (H) 0.00 - 0.50 ug/mL-FEU    Comment: (NOTE) At the manufacturer cut-off of 0.50 ug/mL FEU, this assay has been documented to exclude PE with a sensitivity and negative predictive value of 97 to 99%.  At this time, this assay has not been approved by the FDA to exclude DVT/VTE. Results should be correlated with clinical presentation. Performed at [facility], [address] [place], [place] [address]   Troponin I -     Status: Abnormal   Collection Time: 12/28/18  8:02 PM  Result Value Ref Range   Troponin I 0.09 (HH) <0.03 ng/mL    Comment: CRITICAL RESULT CALLED TO, READ BACK BY AND VERIFIED WITH: DOSS,M ON 12/28/18 AT 2050 BY LOY,C Performed at [facility], [address] [place], [place] [address]     Chemistries  Recent Labs  Lab 12/28/18 2002  NA 139  K 3.2*  CL 107  CO2 23  GLUCOSE 110*  BUN 11  CREATININE 0.84  CALCIUM 8.7*  AST 17  ALT 18  ALKPHOS 87  BILITOT 0.5   ------------------------------------------------------------------------------------------------------------------  ------------------------------------------------------------------------------------------------------------------ GFR: Estimated Creatinine Clearance: 105.1 mL/min (by C-G formula based on SCr of 0.84 mg/dL). Liver Function Tests: Recent Labs  Lab 12/28/18 2002  AST 17  ALT 18  ALKPHOS 87  BILITOT 0.5  PROT 7.3  ALBUMIN 3.8   No results  for input(s): LIPASE, AMYLASE in the last 168 hours. No results for input(s): AMMONIA in the last 168 hours. Coagulation Profile: No results for input(s): INR, PROTIME in the last 168 hours. Cardiac Enzymes: Recent Labs  Lab 12/28/18 2002  TROPONINI 0.09*    --------------------------------------------------------------------------------------------------------------- Urine analysis:    Component Value Date/Time   COLORURINE YELLOW 10/08/2018 2356   APPEARANCEUR HAZY (A) 10/08/2018 2356   LABSPEC 1.023 10/08/2018 2356   PHURINE 5.0 10/08/2018 2356   GLUCOSEU NEGATIVE 10/08/2018 2356   HGBUR MODERATE (A) 10/08/2018 2356   BILIRUBINUR NEGATIVE 10/08/2018 2356   BILIRUBINUR small 06/27/2017 1256   KETONESUR NEGATIVE 10/08/2018 2356   PROTEINUR 100 (A) 10/08/2018 2356   UROBILINOGEN 1.0 06/27/2017 1256   UROBILINOGEN 1.0 08/17/2015 0933   NITRITE NEGATIVE 10/08/2018 2356   LEUKOCYTESUR NEGATIVE 10/08/2018 2356      Imaging Results:    Dg Chest 2 View  Result Date: 12/28/2018 CLINICAL DATA:  Shortness of breath.  Cough. EXAM: CHEST - 2 VIEW COMPARISON:  September 14, 2018 FINDINGS: The heart size and mediastinal contours are within normal limits. Both lungs are clear. The visualized skeletal structures are unremarkable. IMPRESSION: No active cardiopulmonary disease. Electronically Signed   By:  Gerome Sam III M.D   On: 12/28/2018 20:11   Ct Angio Chest Pe W And/or Wo Contrast  Result Date: 12/28/2018 CLINICAL DATA:  Shortness of breath.  Elevated D-dimer. EXAM: CT ANGIOGRAPHY CHEST WITH CONTRAST TECHNIQUE: Multidetector CT imaging of the chest was performed using the standard protocol during bolus administration of intravenous contrast. Multiplanar CT image reconstructions and MIPs were obtained to evaluate the vascular anatomy. CONTRAST:  OMNIPAQUE IOHEXOL 350 MG/ML SOLN COMPARISON:  Radiography same day FINDINGS: Cardiovascular: Pulmonary arterial opacification is moderate  to good. There are no pulmonary emboli. Heart size is normal. The aorta is normal. No coronary artery calcification is seen. Mediastinum/Nodes: No mediastinal or hilar mass or lymphadenopathy. There is a small hiatal hernia. Lungs/Pleura: The lungs are clear.  No pleural fluid. Upper Abdomen: Small hiatal hernia as noted above. Otherwise no significant finding. Previous cholecystectomy. Musculoskeletal: Ordinary mild degenerative changes. Review of the MIP images confirms the above findings. IMPRESSION: No pulmonary emboli or other acute chest pathology identified. Small hiatal hernia. Electronically Signed   By: Paulina Fusi M.D.   On: 12/28/2018 21:46    My personal review of EKG: Rhythm NSR   Assessment & Plan:    Active Problems:   Hypokalemia   Dyspnea   1. Dyspnea on exertion-unclear etiology, patient does have elevated troponin.  Will cycle troponin every 6 hours x3, EKG is unremarkable.  Will obtain echocardiogram in a.m.  2. Hypokalemia-potassium is 3.2, replace potassium and check BMP in a.m.  3. Uncontrolled hypertension-blood pressure is elevated, continue home dose of amlodipine 10 mg daily, will add metoprolol 25 mg p.o. twice daily.  4. Hypothyroidism-patient is on NP thyroid at home.,  Will continue with this medication.  5. Depression-continue Celexa 10 mg p.o. daily   DVT Prophylaxis-   Lovenox   AM Labs Ordered, also please review Full Orders  Family Communication: Admission, patients condition and plan of care including tests being ordered have been discussed with the patient  who indicate understanding and agree with the plan and Code Status.  Code Status: Full code  Admission status: Observation: Based on patients clinical presentation and evaluation of above clinical data, I have made determination that patient needs less than 2 midnight stay in the hospital.  Time spent in minutes : 60 minutes   Meredeth Ide M.D on 12/28/2018 at 11:09 PM

## 2018-12-28 NOTE — ED Triage Notes (Signed)
Pt states she was washing her dishes "awhile ago" and became short of breath.

## 2018-12-29 ENCOUNTER — Observation Stay (HOSPITAL_BASED_OUTPATIENT_CLINIC_OR_DEPARTMENT_OTHER): Payer: Medicaid Other

## 2018-12-29 ENCOUNTER — Encounter (HOSPITAL_COMMUNITY): Payer: Self-pay | Admitting: Internal Medicine

## 2018-12-29 ENCOUNTER — Other Ambulatory Visit: Payer: Self-pay

## 2018-12-29 DIAGNOSIS — K219 Gastro-esophageal reflux disease without esophagitis: Secondary | ICD-10-CM

## 2018-12-29 DIAGNOSIS — I361 Nonrheumatic tricuspid (valve) insufficiency: Secondary | ICD-10-CM | POA: Diagnosis not present

## 2018-12-29 DIAGNOSIS — I340 Nonrheumatic mitral (valve) insufficiency: Secondary | ICD-10-CM

## 2018-12-29 DIAGNOSIS — R0602 Shortness of breath: Secondary | ICD-10-CM | POA: Diagnosis not present

## 2018-12-29 DIAGNOSIS — E079 Disorder of thyroid, unspecified: Secondary | ICD-10-CM

## 2018-12-29 DIAGNOSIS — I10 Essential (primary) hypertension: Secondary | ICD-10-CM

## 2018-12-29 DIAGNOSIS — E876 Hypokalemia: Secondary | ICD-10-CM | POA: Diagnosis not present

## 2018-12-29 LAB — ECHOCARDIOGRAM COMPLETE
Height: 73 in
Weight: 3691.2 oz

## 2018-12-29 LAB — COMPREHENSIVE METABOLIC PANEL
ALT: 15 U/L (ref 0–44)
AST: 13 U/L — ABNORMAL LOW (ref 15–41)
Albumin: 3.2 g/dL — ABNORMAL LOW (ref 3.5–5.0)
Alkaline Phosphatase: 78 U/L (ref 38–126)
Anion gap: 5 (ref 5–15)
BUN: 9 mg/dL (ref 6–20)
CALCIUM: 8.7 mg/dL — AB (ref 8.9–10.3)
CO2: 23 mmol/L (ref 22–32)
Chloride: 114 mmol/L — ABNORMAL HIGH (ref 98–111)
Creatinine, Ser: 0.72 mg/dL (ref 0.44–1.00)
GFR calc Af Amer: >60 mL/min (ref >60)
GFR calc non Af Amer: >60 mL/min (ref >60)
Glucose, Bld: 97 mg/dL (ref 70–99)
Potassium: 3.9 mmol/L (ref 3.5–5.1)
Sodium: 142 mmol/L (ref 135–145)
Total Bilirubin: 0.2 mg/dL — ABNORMAL LOW (ref 0.3–1.2)
Total Protein: 6.4 g/dL — ABNORMAL LOW (ref 6.5–8.1)

## 2018-12-29 LAB — TROPONIN I
Troponin I: 0.08 ng/mL (ref <0.03)
Troponin I: 0.07 ng/mL (ref <0.03)
Troponin I: 0.07 ng/mL (ref <0.03)

## 2018-12-29 LAB — CBC
HEMATOCRIT: 33.4 % — AB (ref 36.0–46.0)
Hemoglobin: 10.3 g/dL — ABNORMAL LOW (ref 12.0–15.0)
MCH: 27.7 pg (ref 26.0–34.0)
MCHC: 30.8 g/dL (ref 30.0–36.0)
MCV: 89.8 fL (ref 80.0–100.0)
Platelets: 305 10*3/uL (ref 150–400)
RBC: 3.72 MIL/uL — ABNORMAL LOW (ref 3.87–5.11)
RDW: 15.6 % — ABNORMAL HIGH (ref 11.5–15.5)
WBC: 9.2 10*3/uL (ref 4.0–10.5)
nRBC: 0 % (ref 0.0–0.2)

## 2018-12-29 MED ORDER — DIPHENHYDRAMINE HCL 25 MG PO TABS: 25.0000 mg | ORAL_TABLET | Freq: Four times a day (QID) | ORAL | Status: DC | PRN

## 2018-12-29 MED ORDER — METOPROLOL TARTRATE 25 MG PO TABS
25.0000 mg | ORAL_TABLET | Freq: Two times a day (BID) | ORAL | Status: DC
  Administered 2018-12-29: 25 mg via ORAL
  Filled 2018-12-29: qty 1

## 2018-12-29 MED ORDER — POTASSIUM CHLORIDE CRYS ER 20 MEQ PO TBCR
40.0000 meq | EXTENDED_RELEASE_TABLET | ORAL | Status: AC
  Administered 2018-12-29: 40 meq via ORAL
  Filled 2018-12-29: qty 2

## 2018-12-29 MED ORDER — PANTOPRAZOLE SODIUM 20 MG PO TBEC: 20.0000 mg | DELAYED_RELEASE_TABLET | Freq: Every day | ORAL | 1 refills | Status: DC

## 2018-12-29 MED ORDER — THYROID 30 MG PO TABS
30.0000 mg | ORAL_TABLET | Freq: Every day | ORAL | Status: DC
  Administered 2018-12-29: 30 mg via ORAL
  Filled 2018-12-29 (×2): qty 1

## 2018-12-29 MED ORDER — ASPIRIN EC 81 MG PO TBEC: 81.0000 mg | DELAYED_RELEASE_TABLET | Freq: Every day | ORAL | 2 refills | Status: AC

## 2018-12-29 MED ORDER — METOPROLOL TARTRATE 25 MG PO TABS: 25.0000 mg | ORAL_TABLET | Freq: Two times a day (BID) | ORAL | 1 refills | Status: DC

## 2018-12-29 MED ORDER — ACETAMINOPHEN 650 MG RE SUPP: 650.0000 mg | Freq: Four times a day (QID) | RECTAL | Status: DC | PRN

## 2018-12-29 MED ORDER — SODIUM CHLORIDE 0.9 % IV SOLN: 250.0000 mL | INTRAVENOUS | Status: DC | PRN

## 2018-12-29 MED ORDER — AMLODIPINE BESYLATE 5 MG PO TABS
10.0000 mg | ORAL_TABLET | Freq: Every day | ORAL | Status: DC
  Administered 2018-12-29: 10 mg via ORAL
  Filled 2018-12-29: qty 2

## 2018-12-29 MED ORDER — SODIUM CHLORIDE 0.9% FLUSH: 3.0000 mL | INTRAVENOUS | Status: DC | PRN

## 2018-12-29 MED ORDER — CITALOPRAM HYDROBROMIDE 20 MG PO TABS
20.0000 mg | ORAL_TABLET | Freq: Every day | ORAL | Status: DC
  Administered 2018-12-29: 20 mg via ORAL
  Filled 2018-12-29: qty 1

## 2018-12-29 MED ORDER — ACETAMINOPHEN 325 MG PO TABS: 650.0000 mg | ORAL_TABLET | Freq: Four times a day (QID) | ORAL | Status: DC | PRN

## 2018-12-29 MED ORDER — SODIUM CHLORIDE 0.9% FLUSH
3.0000 mL | Freq: Two times a day (BID) | INTRAVENOUS | Status: DC
  Administered 2018-12-29 (×2): 3 mL via INTRAVENOUS

## 2018-12-29 MED ORDER — ENOXAPARIN SODIUM 60 MG/0.6ML ~~LOC~~ SOLN
0.5000 mg/kg | SUBCUTANEOUS | Status: DC
  Administered 2018-12-29: 50 mg via SUBCUTANEOUS
  Filled 2018-12-29: qty 0.6

## 2018-12-29 NOTE — Discharge Summary (Signed)
Physician Discharge Summary  Glorious Peachina Birdsall NWG:[id number] DOB: August 21, 1961 DOA: 12/28/2018  PCP: No primary care provider on file.  Admit date: 12/28/2018 Discharge date: 12/29/2018  Time spent: 30 minutes  Recommendations for Outpatient Follow-up:  Repeat basic metabolic panel to follow electrolytes and renal function. Reassess blood pressure and further adjust antihypertensive regimen as needed.   Discharge Diagnoses:  Active Problems:   Benign essential HTN   Hypokalemia   SOB (shortness of breath)   Thyroid disease   Gastroesophageal reflux disease without esophagitis   Discharge Condition: Stable and improved.  Patient discharged home with instructions to follow-up with PCP as previously scheduled and with cardiology service in 2 weeks for outpatient stratification evaluation.  Diet recommendation: Heart healthy diet  Filed Weights   12/28/18 1913 12/29/18 0000  Weight: 107 kg 104.6 kg    History of present illness:  As per H&P written by Dr. Sharl MaLama on 12/28/2018 58 y.o. female, with history of hypertension, anxiety, depression came to hospital with worsening shortness of breath of 1 day duration.  Patient says that she became short of breath when she got up to do dishes.  She denies chest pain.  No sweating. Denies nausea vomiting or diarrhea. She does have hypertension and found to have elevated blood pressure in the ED Denies previous history of stroke or seizures. No history of cancer. Denies abdominal pain, no dysuria.  Hospital Course:  1-dyspnea on exertion: -Resolved by time of discharge -Patient denies chest pain -Flat elevation of troponin appreciated during hospitalization -No abnormalities on EKG, telemetry and with reassurance 2D echo. -Patient advised to follow heart healthy diet -Blood pressure medications were adjusted and patient started on baby aspirin on daily basis. -She will follow-up with cardiology service as an outpatient for further  stratification.  2-hypokalemia -Repleted and within normal limits at discharge -Advised to maintain adequate hydration and nutrition.  3-essential hypertension: Uncontrolled on admission -Patient was continue on her daily amlodipine 10 mg daily and started on metoprolol 25 mg twice a day. -Heart healthy diet has been encouraged. -Outpatient follow-up of her blood pressure is recommended with further adjustment to antihypertensive regimen as needed.  4-hypothyroidism -Continue thyroid -Repeat thyroid panel as an outpatient and adjust medication as needed.  5-depression -No suicidal ideation or hallucination -Continue Celexa  6-GERD -Discharged on PPI.  Procedures:  2D echo: Preserved ejection fraction, no wall motion normalities, no significant valvular defects.  Consultations:  Cardiology service was curbside, Dr. Purvis SheffieldKoneswaran  (recommended outpatient follow-up for further stratification).  Discharge Exam: Vitals:   12/28/18 2344 12/29/18 0000  BP: 111/70 124/72  Pulse: 69 73  Resp: 17 20  Temp:  98.4 F (36.9 C)  SpO2: 99% 99%    General: Afebrile, no nausea, no vomiting.  Patient reports no further shortness of breath or chest pain.  Wanting to go home. Cardiovascular: S1 and S2, no rubs, no gallops, no murmurs on exam. Respiratory: Good air movement bilaterally, no wheezing, no crackles. Abdomen: Soft, nontender, nondistended, positive bowel sounds Extremities: No edema, no cyanosis, no clubbing.  Discharge Instructions   Discharge Instructions    Diet - low sodium heart healthy   Complete by:  As directed    Discharge instructions   Complete by:  As directed    Take medications as prescribed Follow heart healthy diet Follow-up with primary care doctor as already scheduled. Maintain adequate hydration. Follow-up with cardiology service for further work-up/stratification as an outpatient (office will set up appointment for you).  Allergies as of  12/29/2018      Reactions   Ibuprofen    Causes soreness to stomach, per pt.      Medication List    STOP taking these medications   hydrOXYzine 25 MG tablet Commonly known as:  ATARAX/VISTARIL     TAKE these medications   amLODipine 10 MG tablet Commonly known as:  NORVASC Take 10 mg by mouth daily.   aspirin EC 81 MG tablet Take 1 tablet (81 mg total) by mouth daily.   citalopram 10 MG tablet Commonly known as:  CELEXA Take 20 mg by mouth daily.   diphenhydrAMINE 25 MG tablet Commonly known as:  BENADRYL Take 1 tablet (25 mg total) by mouth every 6 (six) hours as needed for itching or allergies. What changed:    when to take this  reasons to take this   Estradiol 10 MCG Tabs vaginal tablet Place 1 tablet vaginally 2 (two) times a week.   metoprolol tartrate 25 MG tablet Commonly known as:  LOPRESSOR Take 1 tablet (25 mg total) by mouth 2 (two) times daily.   NP Thyroid 30 MG tablet Generic drug:  thyroid Take 1 tablet by mouth daily.   pantoprazole 20 MG tablet Commonly known as:  Protonix Take 1 tablet (20 mg total) by mouth daily.   Vitamin D (Ergocalciferol) 1.25 MG (50000 UT) Caps capsule Commonly known as:  DRISDOL Take 50,000 Units by mouth every 7 (seven) days.      Allergies  Allergen Reactions  . Ibuprofen     Causes soreness to stomach, per pt.   Follow-up Information    Antoine Poche, MD On 01/19/2019.   Specialty:  Cardiology Why:  at 8:40 am Contact information: [address] [place] [address] [phone]           The results of significant diagnostics from this hospitalization (including imaging, microbiology, ancillary and laboratory) are listed below for reference.    Significant Diagnostic Studies: Dg Chest 2 View  Result Date: 12/28/2018 CLINICAL DATA:  Shortness of breath.  Cough. EXAM: CHEST - 2 VIEW COMPARISON:  September 14, 2018 FINDINGS: The heart size and mediastinal contours are within normal limits.  Both lungs are clear. The visualized skeletal structures are unremarkable. IMPRESSION: No active cardiopulmonary disease. Electronically Signed   By: Gerome Sam III M.D   On: 12/28/2018 20:11   Ct Angio Chest Pe W And/or Wo Contrast  Result Date: 12/28/2018 CLINICAL DATA:  Shortness of breath.  Elevated D-dimer. EXAM: CT ANGIOGRAPHY CHEST WITH CONTRAST TECHNIQUE: Multidetector CT imaging of the chest was performed using the standard protocol during bolus administration of intravenous contrast. Multiplanar CT image reconstructions and MIPs were obtained to evaluate the vascular anatomy. CONTRAST:  OMNIPAQUE IOHEXOL 350 MG/ML SOLN COMPARISON:  Radiography same day FINDINGS: Cardiovascular: Pulmonary arterial opacification is moderate to good. There are no pulmonary emboli. Heart size is normal. The aorta is normal. No coronary artery calcification is seen. Mediastinum/Nodes: No mediastinal or hilar mass or lymphadenopathy. There is a small hiatal hernia. Lungs/Pleura: The lungs are clear.  No pleural fluid. Upper Abdomen: Small hiatal hernia as noted above. Otherwise no significant finding. Previous cholecystectomy. Musculoskeletal: Ordinary mild degenerative changes. Review of the MIP images confirms the above findings. IMPRESSION: No pulmonary emboli or other acute chest pathology identified. Small hiatal hernia. Electronically Signed   By: Paulina Fusi M.D.   On: 12/28/2018 21:46   Labs: Basic Metabolic Panel: Recent Labs  Lab 12/28/18  2002 12/29/18 0709  NA 139 142  K 3.2* 3.9  CL 107 114*  CO2 23 23  GLUCOSE 110* 97  BUN 11 9  CREATININE 0.84 0.72  CALCIUM 8.7* 8.7*   Liver Function Tests: Recent Labs  Lab 12/28/18 2002 12/29/18 0709  AST 17 13*  ALT 18 15  ALKPHOS 87 78  BILITOT 0.5 0.2*  PROT 7.3 6.4*  ALBUMIN 3.8 3.2*   CBC: Recent Labs  Lab 12/28/18 2002 12/29/18 0709  WBC 9.6 9.2  NEUTROABS 4.5  --   HGB 10.9* 10.3*  HCT 35.6* 33.4*  MCV 90.1 89.8  PLT 326  305   Cardiac Enzymes: Recent Labs  Lab 12/28/18 2002 12/29/18 0114 12/29/18 0709  TROPONINI 0.09* 0.08* 0.07*    Signed:  Vassie Loll MD.  [facility] 12/29/2018, 2:11 PM

## 2018-12-29 NOTE — Progress Notes (Signed)
DC instructions given with med changes, prescriptions, activity level, diet, and FU appointment. All questions answered.  IV discontinued. Tele discontinued. Pt ambulated to dc area w/ care tech. No distress noted.

## 2018-12-29 NOTE — Progress Notes (Signed)
*  PRELIMINARY RESULTS* Echocardiogram 2D Echocardiogram has been performed.  Janet Mcguire 12/29/2018, 12:24 PM

## 2018-12-29 NOTE — Progress Notes (Signed)
Received to room 338 from ER via stretcher. Assisted to bed and positioned for comfort. Oriented to room, bed and unit. In no acute respiratory distress and denies pain.

## 2019-01-19 ENCOUNTER — Ambulatory Visit: Payer: Self-pay | Admitting: Cardiology

## 2019-01-21 ENCOUNTER — Ambulatory Visit (HOSPITAL_COMMUNITY): Payer: Self-pay | Admitting: Psychiatry

## 2019-02-03 ENCOUNTER — Telehealth: Payer: Self-pay | Admitting: *Deleted

## 2019-02-03 NOTE — Telephone Encounter (Signed)
Virtual Visit Pre-Appointment Phone Call  Steps For Call:  1. Confirm consent - "In the setting of the current Covid19 crisis, you are scheduled for a (phone or video) visit with your provider on (date) at (time).  Just as we do with many in-office visits, in order for you to participate in this visit, we must obtain consent.  If you'd like, I can send this to your mychart (if signed up) or email for you to review.  Otherwise, I can obtain your verbal consent now.  All virtual visits are billed to your insurance company just like a normal visit would be.  By agreeing to a virtual visit, we'd like you to understand that the technology does not allow for your provider to perform an examination, and thus may limit your provider's ability to fully assess your condition.  Finally, though the technology is pretty good, we cannot assure that it will always work on either your or our end, and in the setting of a video visit, we may have to convert it to a phone-only visit.  In either situation, we cannot ensure that we have a secure connection.  Are you willing to proceed?" STAFF: Did the patient verbally acknowledge consent to telehealth visit? Document YES/NO here:yes   2. Confirm the BEST phone number to call the day of the visit by including in appointment notes  3. Give patient instructions for WebEx/MyChart download to smartphone as below or Doximity/Doxy.me if video visit (depending on what platform provider is using)  4. Advise patient to be prepared with their blood pressure, heart rate, weight, any heart rhythm information, their current medicines, and a piece of paper and pen handy for any instructions they may receive the day of their visit  5. Inform patient they will receive a phone call 15 minutes prior to their appointment time (may be from unknown caller ID) so they should be prepared to answer  6. Confirm that appointment type is correct in Epic appointment notes (VIDEO vs PHONE)      TELEPHONE CALL NOTE  Janet Mcguire has been deemed a candidate for a follow-up tele-health visit to limit community exposure during the Covid-19 pandemic. I spoke with the patient via phone to ensure availability of phone/video source, confirm preferred email & phone number, and discuss instructions and expectations.  I reminded Janet Peachina Amison to be prepared with any vital sign and/or heart rhythm information that could potentially be obtained via home monitoring, at the time of her visit. I reminded Janet Peachina Hackley to expect a phone call at the time of her visit if her visit.  Elesa MassedINNIX, Kadi Hession, LPN 4/09/81194/15/2020 1:479:25 AM   INSTRUCTIONS FOR DOWNLOADING THE WEBEX APP TO SMARTPHONE  - If Apple, ask patient to go to [employer] Store and type in WebEx in the search bar. Download [employer]ex Meetings, the blue/green circle. If Android, go to [employer] Play Store and type in [employer]Ex in the search bar. The app is free but as with any other app downloads, their phone may require them to verify saved payment information or Apple/Android password.  - The patient does NOT have to create an account. - On the day of the visit, the assist will walk the patient through joining the meeting with the meeting number/password.  INSTRUCTIONS FOR DOWNLOADING THE MYCHART APP TO SMARTPHONE  - The patient must first make sure to have activated MyChart and know their login information - If Apple, go to [employer] Store and type in MyChart in the  search bar and download the app. If Android, ask patient to go to [employer] and type in [place] in the search bar and download the app. The app is free but as with any other app downloads, their phone may require them to verify saved payment information or Apple/Android password.  - The patient will need to then log into the app with their MyChart username and password, and select [facility] as their healthcare provider to link the account. When it is time for your visit, go to the MyChart app, find  appointments, and click Begin Video Visit. Be sure to Select Allow for your device to access the Microphone and Camera for your visit. You will then be connected, and your provider will be with you shortly.  **If they have any issues connecting, or need assistance please contact MyChart service desk [phone])**  **If using a computer, in order to ensure the best quality for their visit they will need to use either of the following Internet Browsers: [employer], or Google Chrome**  IF USING DOXIMITY or DOXY.ME - The patient will receive a link just prior to their visit, either by text or email (to be determined day of appointment depending on if it's doxy.me or Doximity).     FULL LENGTH CONSENT FOR TELE-HEALTH VISIT   I hereby voluntarily request, consent and authorize [facility] and its employed or contracted physicians, physician assistants, nurse practitioners or other licensed health care professionals (the Practitioner), to provide me with telemedicine health care services (the "Services") as deemed necessary by the treating Practitioner. I acknowledge and consent to receive the Services by the Practitioner via telemedicine. I understand that the telemedicine visit will involve communicating with the Practitioner through live audiovisual communication technology and the disclosure of certain medical information by electronic transmission. I acknowledge that I have been given the opportunity to request an in-person assessment or other available alternative prior to the telemedicine visit and am voluntarily participating in the telemedicine visit.  I understand that I have the right to withhold or withdraw my consent to the use of telemedicine in the course of my care at any time, without affecting my right to future care or treatment, and that the Practitioner or I may terminate the telemedicine visit at any time. I understand that I have the right to inspect all  information obtained and/or recorded in the course of the telemedicine visit and may receive copies of available information for a reasonable fee.  I understand that some of the potential risks of receiving the Services via telemedicine include:  Marland Kitchen Delay or interruption in medical evaluation due to technological equipment failure or disruption; . Information transmitted may not be sufficient (e.g. poor resolution of images) to allow for appropriate medical decision making by the Practitioner; and/or  . In rare instances, security protocols could fail, causing a breach of personal health information.  Furthermore, I acknowledge that it is my responsibility to provide information about my medical history, conditions and care that is complete and accurate to the best of my ability. I acknowledge that Practitioner's advice, recommendations, and/or decision may be based on factors not within their control, such as incomplete or inaccurate data provided by me or distortions of diagnostic images or specimens that may result from electronic transmissions. I understand that the practice of medicine is not an exact science and that Practitioner makes no warranties or guarantees regarding treatment outcomes. I acknowledge that I will receive a copy of this consent  concurrently upon execution via email to the email address I last provided but may also request a printed copy by calling the office of [place] [facility].    I understand that my insurance will be billed for this visit.   I have read or had this consent read to me. . I understand the contents of this consent, which adequately explains the benefits and risks of the Services being provided via telemedicine.  . I have been provided ample opportunity to ask questions regarding this consent and the Services and have had my questions answered to my satisfaction. . I give my informed consent for the services to be provided through the use of telemedicine in my  medical care  By participating in this telemedicine visit I agree to the above.

## 2019-02-10 ENCOUNTER — Telehealth (INDEPENDENT_AMBULATORY_CARE_PROVIDER_SITE_OTHER): Payer: Medicaid Other | Admitting: Cardiology

## 2019-02-10 ENCOUNTER — Ambulatory Visit (HOSPITAL_COMMUNITY): Payer: Self-pay

## 2019-02-10 ENCOUNTER — Encounter: Payer: Self-pay | Admitting: Cardiology

## 2019-02-10 ENCOUNTER — Ambulatory Visit: Payer: Self-pay | Admitting: Cardiology

## 2019-02-10 VITALS — Ht 73.0 in | Wt 235.0 lb

## 2019-02-10 DIAGNOSIS — R0602 Shortness of breath: Secondary | ICD-10-CM | POA: Diagnosis not present

## 2019-02-10 MED ORDER — METOPROLOL TARTRATE 25 MG PO TABS: 25.0000 mg | ORAL_TABLET | Freq: Two times a day (BID) | ORAL | 3 refills | Status: DC

## 2019-02-10 NOTE — Progress Notes (Signed)
Virtual Visit via Telephone Note   This visit type was conducted due to national recommendations for restrictions regarding the COVID-19 Pandemic (e.g. social distancing) in an effort to limit this patient's exposure and mitigate transmission in our community.  Due to her co-morbid illnesses, this patient is at least at moderate risk for complications without adequate follow up.  This format is felt to be most appropriate for this patient at this time.  The patient did not have access to video technology/had technical difficulties with video requiring transitioning to audio format only (telephone).  All issues noted in this document were discussed and addressed.  No physical exam could be performed with this format.  Please refer to the patient's chart for her  consent to telehealth for [facility].   Evaluation Performed:  Follow-up visit  Date:  02/10/2019   ID:  Janet Mcguire, Janet Mcguire Apr 17, 1961, MRN [id number]  Patient Location: Home Provider Location: Home  PCP:  System, Provider Not In  Cardiologist:  Dina Rich, MD  Electrophysiologist:  None   Chief Complaint:  SOB  History of Present Illness:    Janet Mcguire is a 58 y.o. female seen as new patient for the following medical problems  1. SOB - admitted 12/2018 with SOB - mild trop elevation to 0.09, overall flat. EKG SR without ischemic changes - CXR no acute process. CT PE no PE - Echo LVEF 60-65%, normal diastolic function, normal RV.     - SOB started while washing dishes. Sudden SOB. Mild tightness midchest. +wheezing.  - no recurrent SOB/DOE, no recent chest pain   The patient does not have symptoms concerning for COVID-19 infection (fever, chills, cough, or new shortness of breath).    Past Medical History:  Diagnosis Date  . Allergy   . Anxiety   . Depression   . Gastroesophageal reflux disease without esophagitis   . Hypertension   . Rectal irritation    chronic  . Substance abuse (HCC)    "years ago"    Past Surgical History:  Procedure Laterality Date  . CHOLECYSTECTOMY    . COLONOSCOPY N/A 09/10/2018   Procedure: COLONOSCOPY;  Surgeon: Malissa Hippo, MD;  Location: [facility];  Service: Endoscopy;  Laterality: N/A;  2:00  . ERCP N/A 01/12/2015   Procedure: ENDOSCOPIC RETROGRADE CHOLANGIOPANCREATOGRAPHY (ERCP) ;  Surgeon: Malissa Hippo, MD;  Location: [facility] ORS;  Service: Endoscopy;  Laterality: N/A;  Balloon Extraction  . SPHINCTEROTOMY N/A 01/12/2015   Procedure: SPHINCTEROTOMY;  Surgeon: Malissa Hippo, MD;  Location: [facility] ORS;  Service: Endoscopy;  Laterality: N/A;  . TUBAL LIGATION       Current Meds  Medication Sig  . amLODipine (NORVASC) 10 MG tablet Take 10 mg by mouth daily.   Marland Kitchen aspirin EC 81 MG tablet Take 1 tablet (81 mg total) by mouth daily.  . citalopram (CELEXA) 10 MG tablet Take 20 mg by mouth daily.   . diphenhydrAMINE (BENADRYL) 25 MG tablet Take 1 tablet (25 mg total) by mouth every 6 (six) hours as needed for itching or allergies.  . Estradiol 10 MCG TABS vaginal tablet Place 1 tablet vaginally 2 (two) times a week.   . metoprolol tartrate (LOPRESSOR) 25 MG tablet Take 1 tablet (25 mg total) by mouth 2 (two) times daily.  . pantoprazole (PROTONIX) 20 MG tablet Take 1 tablet (20 mg total) by mouth daily.  Marland Kitchen thyroid (NP THYROID) 30 MG tablet Take 1 tablet by mouth daily.     Allergies:  Ibuprofen   Social History   Tobacco Use  . Smoking status: Never Smoker  . Smokeless tobacco: Never Used  Substance Use Topics  . Alcohol use: Not Currently  . Drug use: Yes    Types: Marijuana    Comment: last use Tuesday      Family Hx: The patient's family history includes COPD in her sister; Cancer in her brother; Diabetes in her mother, sister, and sister; Kidney disease in her sister and sister.  ROS:   Please see the history of present illness.     All other systems reviewed and are negative.   Prior CV studies:   The following studies were reviewed  today:  12/2018 echo IMPRESSIONS    1. The left ventricle has normal systolic function with an ejection fraction of 60-65%. The cavity size was normal. There is mild concentric left ventricular hypertrophy. Left ventricular diastolic parameters were normal. No evidence of left ventricular  regional wall motion abnormalities.  2. The right ventricle has normal systolic function. The cavity was normal. There is no increase in right ventricular wall thickness.  3. The tricuspid valve is grossly normal.  4. The aortic valve is grossly normal.  5. The aortic root is normal in size and structure.  Labs/Other Tests and Data Reviewed:    EKG:  SR, no ischemic changes  Recent Labs: 10/09/2018: Magnesium 2.0 12/29/2018: ALT 15; BUN 9; Creatinine, Ser 0.72; Hemoglobin 10.3; Platelets 305; Potassium 3.9; Sodium 142   Recent Lipid Panel Lab Results  Component Value Date/Time   CHOL 208 (H) 09/16/2016 10:44 AM   TRIG 110 09/16/2016 10:44 AM   HDL 56 09/16/2016 10:44 AM   CHOLHDL 3.7 09/16/2016 10:44 AM   LDLCALC 130 (H) 09/16/2016 10:44 AM    Wt Readings from Last 3 Encounters:  12/29/18 230 lb 11.2 oz (104.6 kg)  12/13/18 236 lb (107 kg)  10/09/18 220 lb 0.3 oz (99.8 kg)     Objective:    Vital Signs:  No vital signs available for this visit  Normal affect. Normal speech pattern and tone. Comfortable, no apparent distress. No auditory signs of SOB or wheezing.   ASSESSMENT & PLAN:    1. SOB - isolated episode 1.5 months ago. Extensive workup during that admission overall benign, mild flat troponin not consistent with ACS. EKG without ischemic changes, normal echo - no recurrence of symptoms, monitor at this time. If recurrent symptoms would plan for stress testing   2. HTN - had been on norvacs, started on lopressor during recent admission - continue current therapy for now  COVID-19 Education: The signs and symptoms of COVID-19 were discussed with the patient and how to  seek care for testing (follow up with PCP or arrange E-visit).  The importance of social distancing was discussed today.  Time:   Today, I have spent 20 minutes with the patient with telehealth technology discussing the above problems.     Medication Adjustments/Labs and Tests Ordered: Current medicines are reviewed at length with the patient today.  Concerns regarding medicines are outlined above.   Tests Ordered: No orders of the defined types were placed in this encounter.   Medication Changes: No orders of the defined types were placed in this encounter.   Disposition:  Follow up 4 months  Signed, Dina RichBranch, Aradia Estey, MD  02/10/2019 8:09 AM    [facility]

## 2019-02-10 NOTE — Progress Notes (Signed)
Medication Instructions:  Your physician recommends that you continue on your current medications as directed. Please refer to the Current Medication list given to you today.   Labwork: none  Testing/Procedures: none  Follow-Up: Your physician recommends that you schedule a follow-up appointment in: 4  Months    Any Other Special Instructions Will Be Listed Below (If Applicable).     If you need a refill on your cardiac medications before your next appointment, please call your pharmacy.

## 2019-03-22 ENCOUNTER — Ambulatory Visit (HOSPITAL_COMMUNITY): Payer: Self-pay | Admitting: Psychiatry

## 2019-06-21 ENCOUNTER — Ambulatory Visit: Payer: Self-pay | Admitting: Cardiology

## 2019-09-10 ENCOUNTER — Emergency Department (HOSPITAL_COMMUNITY)
Admission: EM | Admit: 2019-09-10 | Discharge: 2019-09-10 | Disposition: A | Discharge status: Home / Self Care | Payer: Medicaid Other

## 2019-09-10 ENCOUNTER — Other Ambulatory Visit: Payer: Self-pay

## 2019-09-10 NOTE — ED Triage Notes (Signed)
Patient called for Triage, no answer. 

## 2019-10-28 DIAGNOSIS — E079 Disorder of thyroid, unspecified: Secondary | ICD-10-CM

## 2019-10-28 DIAGNOSIS — K219 Gastro-esophageal reflux disease without esophagitis: Secondary | ICD-10-CM

## 2019-12-25 IMAGING — DX DG ABDOMEN 1V
2 series · 2 of 2 positions shown · non-contrast
Comparison: Chest and two views abdomen 01/31/2017.

CLINICAL DATA: Abdominal pain. The patient reports no bowel
movement for 3 days.

EXAM:
ABDOMEN - 1 VIEW

[abdomen kub (1 of 2)]
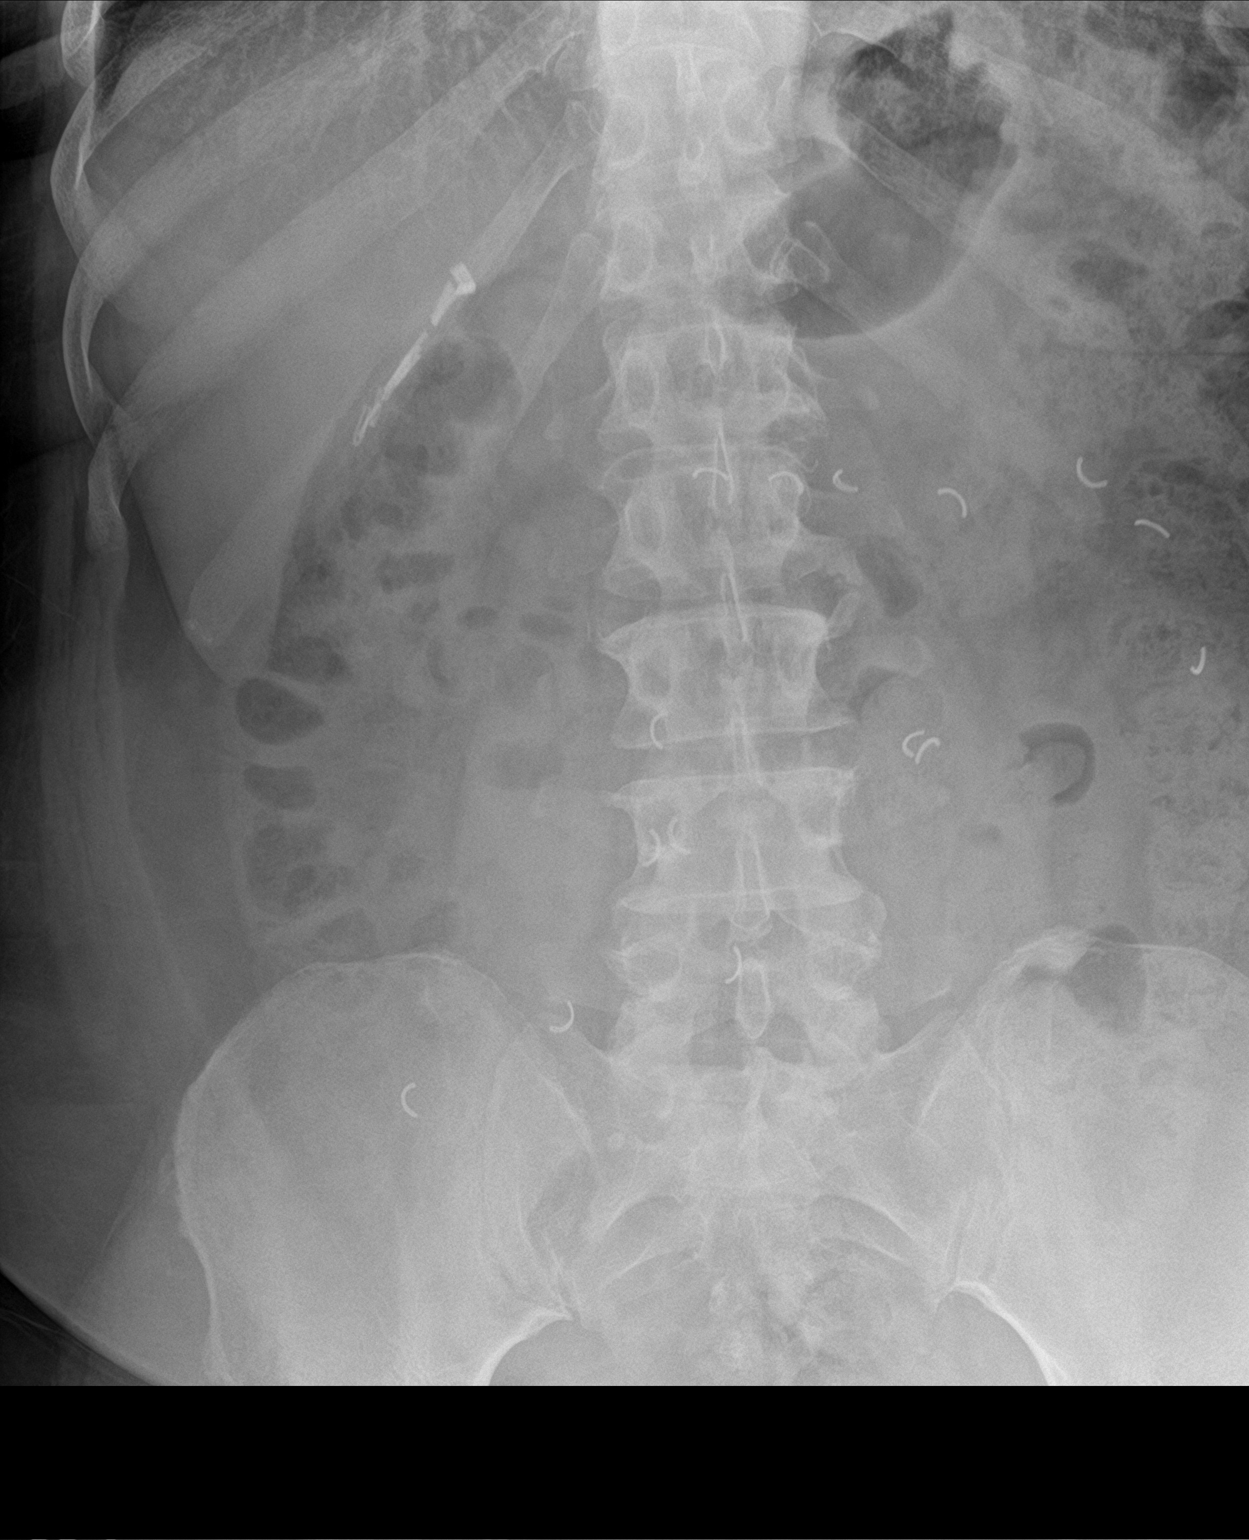

[abdomen kub (2 of 2)]
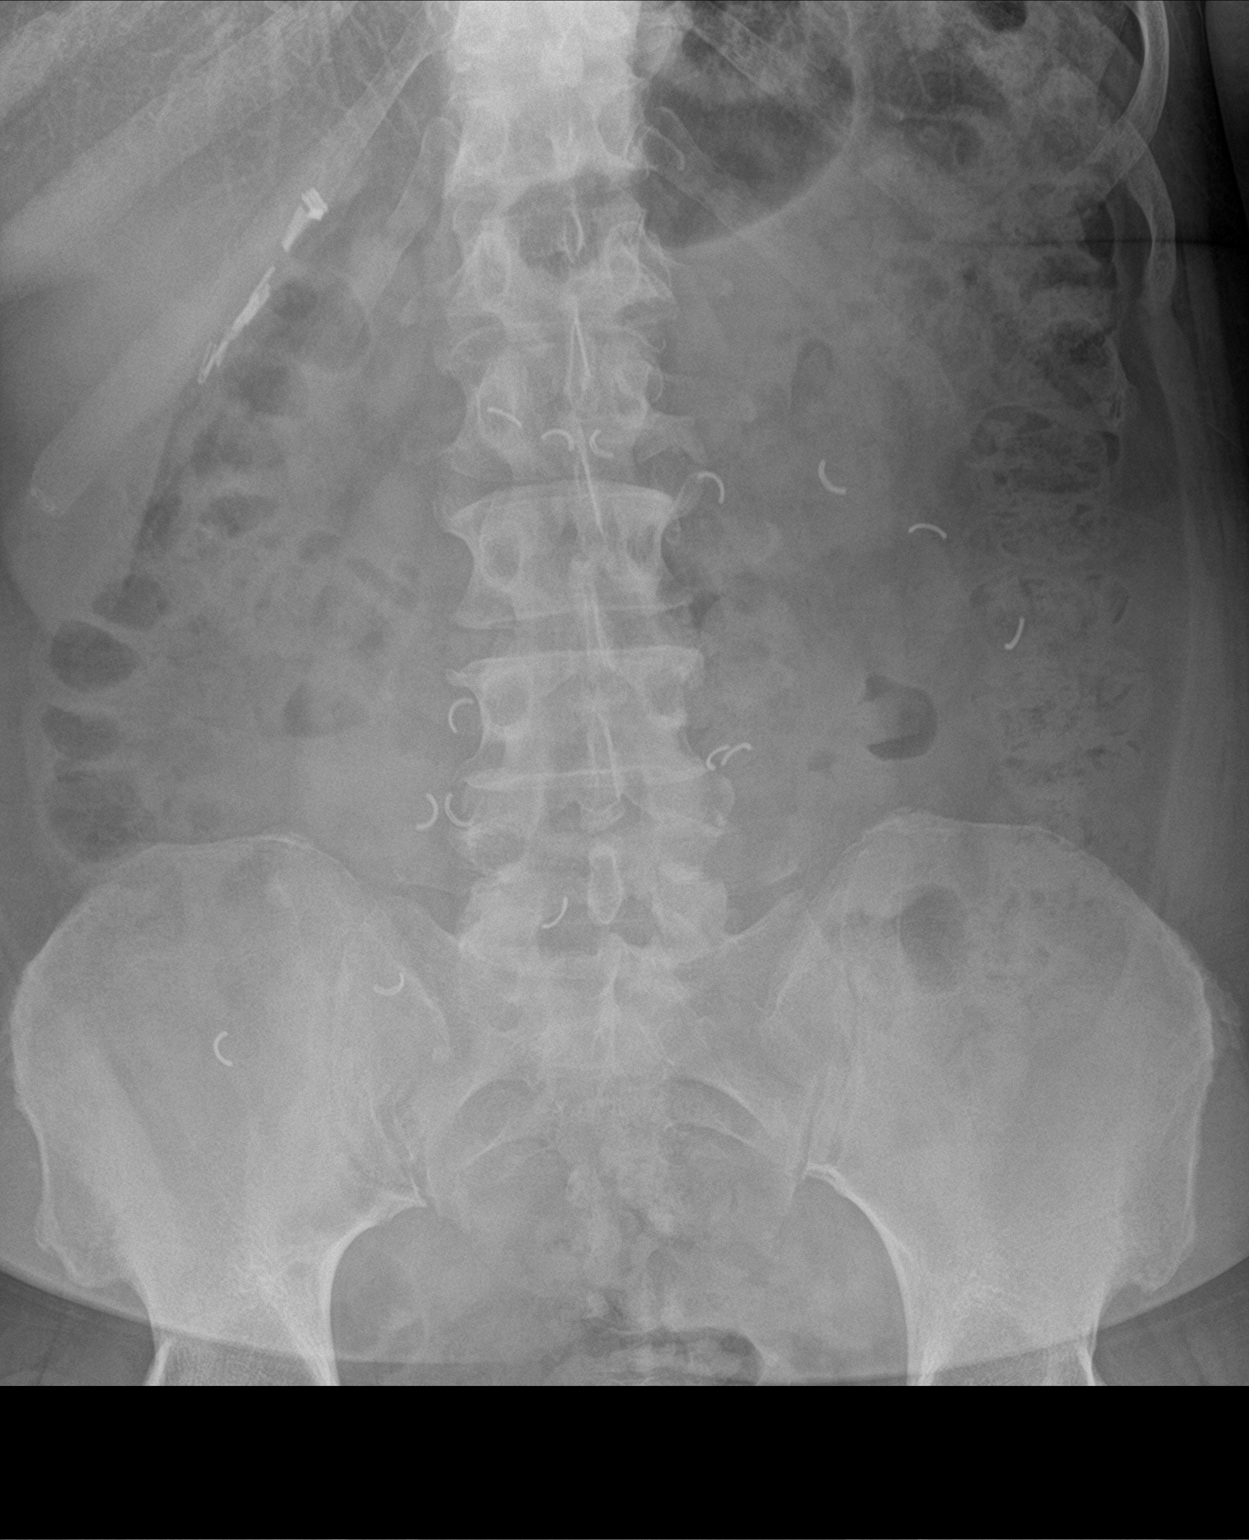

[2 of 2 positions shown; findings below may reference images not displayed]

FINDINGS: Bowel gas pattern is nonobstructive. There is a large volume of
stool throughout the colon. No abnormal abdominal calcification.
Multiple surgical clips in the abdomen noted. No acute bony
abnormality.
IMPRESSION: Large stool burden throughout the colon.  Otherwise negative.

## 2020-03-23 ENCOUNTER — Emergency Department (HOSPITAL_COMMUNITY)
Admission: EM | Admit: 2020-03-23 | Discharge: 2020-03-23 | Disposition: A | Discharge status: Home / Self Care | Payer: Medicaid Other | Attending: Emergency Medicine | Admitting: Emergency Medicine

## 2020-03-23 ENCOUNTER — Encounter (HOSPITAL_COMMUNITY): Payer: Self-pay | Admitting: Emergency Medicine

## 2020-03-23 ENCOUNTER — Other Ambulatory Visit: Payer: Self-pay

## 2020-03-23 DIAGNOSIS — Y9389 Activity, other specified: Secondary | ICD-10-CM | POA: Diagnosis not present

## 2020-03-23 DIAGNOSIS — W57XXXA Bitten or stung by nonvenomous insect and other nonvenomous arthropods, initial encounter: Secondary | ICD-10-CM | POA: Insufficient documentation

## 2020-03-23 DIAGNOSIS — Y999 Unspecified external cause status: Secondary | ICD-10-CM | POA: Diagnosis not present

## 2020-03-23 DIAGNOSIS — Y929 Unspecified place or not applicable: Secondary | ICD-10-CM | POA: Insufficient documentation

## 2020-03-23 DIAGNOSIS — S1086XA Insect bite of other specified part of neck, initial encounter: Secondary | ICD-10-CM | POA: Insufficient documentation

## 2020-03-23 DIAGNOSIS — Z5321 Procedure and treatment not carried out due to patient leaving prior to being seen by health care provider: Secondary | ICD-10-CM | POA: Diagnosis not present

## 2020-03-23 NOTE — ED Triage Notes (Signed)
Patient states that she has a tick bite on the left side of her neck. Area is red with some minor swelling.

## 2020-06-01 ENCOUNTER — Ambulatory Visit: Payer: Medicaid Other | Admitting: Internal Medicine

## 2020-06-16 ENCOUNTER — Telehealth: Payer: Self-pay

## 2020-06-16 NOTE — Telephone Encounter (Signed)
Pt needs to know the address  [address]

## 2020-07-04 ENCOUNTER — Other Ambulatory Visit (HOSPITAL_COMMUNITY): Payer: Self-pay | Admitting: Emergency Medicine

## 2020-07-04 ENCOUNTER — Other Ambulatory Visit (HOSPITAL_COMMUNITY): Payer: Self-pay | Admitting: Physician Assistant

## 2020-07-04 DIAGNOSIS — Z1231 Encounter for screening mammogram for malignant neoplasm of breast: Secondary | ICD-10-CM

## 2020-07-07 ENCOUNTER — Ambulatory Visit (HOSPITAL_COMMUNITY): Payer: Medicaid Other

## 2020-07-07 ENCOUNTER — Encounter (HOSPITAL_COMMUNITY): Payer: Self-pay | Admitting: Emergency Medicine

## 2020-07-07 ENCOUNTER — Other Ambulatory Visit: Payer: Self-pay

## 2020-07-07 ENCOUNTER — Emergency Department (HOSPITAL_COMMUNITY)
Admission: EM | Admit: 2020-07-07 | Discharge: 2020-07-07 | Disposition: A | Discharge status: Home / Self Care | Payer: Medicaid Other | Attending: Emergency Medicine | Admitting: Emergency Medicine

## 2020-07-07 DIAGNOSIS — R21 Rash and other nonspecific skin eruption: Secondary | ICD-10-CM | POA: Diagnosis not present

## 2020-07-07 DIAGNOSIS — Z79899 Other long term (current) drug therapy: Secondary | ICD-10-CM | POA: Insufficient documentation

## 2020-07-07 DIAGNOSIS — I10 Essential (primary) hypertension: Secondary | ICD-10-CM | POA: Insufficient documentation

## 2020-07-07 MED ORDER — NYSTATIN-TRIAMCINOLONE 100000-0.1 UNIT/GM-% EX OINT: 1.0000 "application " | TOPICAL_OINTMENT | Freq: Two times a day (BID) | CUTANEOUS | 0 refills | Status: DC

## 2020-07-07 NOTE — ED Triage Notes (Signed)
Pt c/o of rash on neck, under breast x 2 weeks. Was placed on ABX but states no relief

## 2020-07-10 NOTE — ED Provider Notes (Signed)
[facility] EMERGENCY DEPARTMENT Provider Note   CSN: [id number] Arrival date & time: 07/07/20  8891     History Chief Complaint  Patient presents with  . Rash    Janet Mcguire is a 59 y.o. female.  HPI    59y f with rash. Neck and upper chest. Onset weeks ago. Itches. Has been using some type of cream w/o improvement. No respiratory complaints. No gi complaints,. No new exposures that she is aware of.  Past Medical History:  Diagnosis Date  . Allergy   . Anxiety   . Depression   . Gastroesophageal reflux disease without esophagitis   . Hypertension   . Rectal irritation    chronic  . Substance abuse (HCC)    "years ago"    Patient Active Problem List   Diagnosis Date Noted  . Thyroid disease   . Gastroesophageal reflux disease without esophagitis   . SOB (shortness of breath) 12/28/2018  . Hypokalemia 10/09/2018  . Abdominal pain, chronic, epigastric 10/09/2018  . Rectal bleeding 07/08/2018  . Vitamin D deficiency 09/17/2016  . Literacy level of illiterate 09/16/2016  . Mental impairment 09/16/2016  . Colonoscopy refused 09/16/2016  . Elevated liver enzymes 01/11/2015  . Benign essential HTN 01/11/2015  . Depression 01/11/2015  . History of cholecystectomy 01/11/2015    Past Surgical History:  Procedure Laterality Date  . CHOLECYSTECTOMY    . COLONOSCOPY N/A 09/10/2018   Procedure: COLONOSCOPY;  Surgeon: Malissa Hippo, MD;  Location: [facility];  Service: Endoscopy;  Laterality: N/A;  2:00  . ERCP N/A 01/12/2015   Procedure: ENDOSCOPIC RETROGRADE CHOLANGIOPANCREATOGRAPHY (ERCP) ;  Surgeon: Malissa Hippo, MD;  Location: [facility] ORS;  Service: Endoscopy;  Laterality: N/A;  Balloon Extraction  . SPHINCTEROTOMY N/A 01/12/2015   Procedure: SPHINCTEROTOMY;  Surgeon: Malissa Hippo, MD;  Location: [facility] ORS;  Service: Endoscopy;  Laterality: N/A;  . TUBAL LIGATION       OB History    Gravida  3   Para  1   Term  1   Preterm      AB  2   Living  1       SAB  2   TAB  0   Ectopic      Multiple      Live Births  1           Family History  Problem Relation Age of Onset  . Diabetes Mother   . Cancer Brother        throat  . COPD Sister   . Kidney disease Sister   . Diabetes Sister   . Diabetes Sister   . Kidney disease Sister     Social History   Tobacco Use  . Smoking status: Never Smoker  . Smokeless tobacco: Never Used  Vaping Use  . Vaping Use: Never used  Substance Use Topics  . Alcohol use: Not Currently  . Drug use: Yes    Types: Marijuana    Comment: last use Tuesday     Home Medications Prior to Admission medications   Medication Sig Start Date End Date Taking? Authorizing Provider  amLODipine (NORVASC) 10 MG tablet Take 10 mg by mouth daily.  12/01/18   [provider]  citalopram (CELEXA) 10 MG tablet Take 20 mg by mouth daily.  12/01/18   [provider]  diphenhydrAMINE (BENADRYL) 25 MG tablet Take 1 tablet (25 mg total) by mouth every 6 (six) hours as needed for itching or  allergies. 12/29/18   Vassie Loll, MD  Estradiol 10 MCG TABS vaginal tablet Place 1 tablet vaginally 2 (two) times a week.  12/03/18   [provider]  metoprolol tartrate (LOPRESSOR) 25 MG tablet Take 1 tablet (25 mg total) by mouth 2 (two) times daily. 02/10/19   Antoine Poche, MD  nystatin-triamcinolone ointment [facility]) Apply 1 application topically 2 (two) times daily. 07/07/20   Raeford Razor, MD  pantoprazole (PROTONIX) 20 MG tablet Take 1 tablet (20 mg total) by mouth daily. 12/29/18 12/29/19  Vassie Loll, MD  thyroid (NP THYROID) 30 MG tablet Take 1 tablet by mouth daily. 09/21/18   [provider]    Allergies    Ibuprofen  Review of Systems   Review of Systems All systems reviewed and negative, other than as noted in HPI.  Physical Exam Updated Vital Signs BP 129/73   Pulse 73   Temp 99.1 F (37.3 C) (Oral)   Resp 18   Ht 6\' 1"  (1.854 m)   Wt 114.8 kg   LMP  (LMP  Unknown) Comment: last  one years ago   SpO2 100%   BMI 33.38 kg/m   Physical Exam Vitals and nursing note reviewed.  Constitutional:      General: She is not in acute distress.    Appearance: She is well-developed.  HENT:     Head: Normocephalic and atraumatic.  Eyes:     General:        Right eye: No discharge.        Left eye: No discharge.     Conjunctiva/sclera: Conjunctivae normal.  Cardiovascular:     Rate and Rhythm: Normal rate and regular rhythm.     Heart sounds: Normal heart sounds. No murmur heard.  No friction rub. No gallop.   Pulmonary:     Effort: Pulmonary effort is normal. No respiratory distress.     Breath sounds: Normal breath sounds.  Abdominal:     General: There is no distension.     Palpations: Abdomen is soft.     Tenderness: There is no abdominal tenderness.  Musculoskeletal:        General: No tenderness.     Cervical back: Neck supple.  Skin:    General: Skin is warm and dry.     Findings: Rash present.  Neurological:     Mental Status: She is alert.  Psychiatric:        Behavior: Behavior normal.        Thought Content: Thought content normal.     ED Results / Procedures / Treatments   Labs (all labs ordered are listed, but only abnormal results are displayed) Labs Reviewed - No data to display  EKG None  Radiology No results found.  Procedures Procedures (including critical care time)  Medications Ordered in ED Medications - No data to display  ED Course  I have reviewed the triage vital signs and the nursing notes.  Pertinent labs & imaging results that were available during my care of the patient were reviewed by me and considered in my medical decision making (see chart for details).    MDM Rules/Calculators/A&P                           Final Clinical Impression(s) / ED Diagnoses Final diagnoses:  Rash    Rx / DC Orders ED Discharge Orders         Ordered    nystatin-triamcinolone  ointment (MYCOLOG)  2  times daily        07/07/20 0817           Raeford Razor, MD 07/10/20 [phone]

## 2020-07-11 ENCOUNTER — Ambulatory Visit: Payer: Medicaid Other | Admitting: Internal Medicine

## 2020-07-12 ENCOUNTER — Inpatient Hospital Stay (HOSPITAL_COMMUNITY): Admission: RE | Admit: 2020-07-12 | Payer: Medicaid Other | Source: Ambulatory Visit

## 2020-07-17 ENCOUNTER — Other Ambulatory Visit: Payer: Self-pay

## 2020-07-17 ENCOUNTER — Ambulatory Visit (HOSPITAL_COMMUNITY)
Admission: RE | Admit: 2020-07-17 | Discharge: 2020-07-17 | Disposition: A | Discharge status: Home / Self Care | Payer: Medicaid Other | Source: Ambulatory Visit | Attending: Physician Assistant | Admitting: Physician Assistant

## 2020-07-17 DIAGNOSIS — Z1231 Encounter for screening mammogram for malignant neoplasm of breast: Secondary | ICD-10-CM | POA: Insufficient documentation

## 2020-09-01 ENCOUNTER — Encounter (HOSPITAL_COMMUNITY): Payer: Self-pay | Admitting: Emergency Medicine

## 2020-09-01 ENCOUNTER — Emergency Department (HOSPITAL_COMMUNITY): Payer: Medicaid Other

## 2020-09-01 ENCOUNTER — Other Ambulatory Visit: Payer: Self-pay

## 2020-09-01 ENCOUNTER — Emergency Department (HOSPITAL_COMMUNITY)
Admission: EM | Admit: 2020-09-01 | Discharge: 2020-09-01 | Disposition: A | Discharge status: Home / Self Care | Payer: Medicaid Other | Attending: Emergency Medicine | Admitting: Emergency Medicine

## 2020-09-01 DIAGNOSIS — R1032 Left lower quadrant pain: Secondary | ICD-10-CM | POA: Insufficient documentation

## 2020-09-01 DIAGNOSIS — I10 Essential (primary) hypertension: Secondary | ICD-10-CM | POA: Insufficient documentation

## 2020-09-01 DIAGNOSIS — Z79899 Other long term (current) drug therapy: Secondary | ICD-10-CM | POA: Insufficient documentation

## 2020-09-01 DIAGNOSIS — Z9049 Acquired absence of other specified parts of digestive tract: Secondary | ICD-10-CM | POA: Diagnosis not present

## 2020-09-01 DIAGNOSIS — R1012 Left upper quadrant pain: Secondary | ICD-10-CM | POA: Diagnosis not present

## 2020-09-01 DIAGNOSIS — R197 Diarrhea, unspecified: Secondary | ICD-10-CM | POA: Diagnosis not present

## 2020-09-01 DIAGNOSIS — R110 Nausea: Secondary | ICD-10-CM

## 2020-09-01 DIAGNOSIS — R109 Unspecified abdominal pain: Secondary | ICD-10-CM

## 2020-09-01 LAB — COMPREHENSIVE METABOLIC PANEL
ALT: 22 U/L (ref 0–44)
AST: 20 U/L (ref 15–41)
Albumin: 3.9 g/dL (ref 3.5–5.0)
Alkaline Phosphatase: 72 U/L (ref 38–126)
Anion gap: 10 (ref 5–15)
BUN: 8 mg/dL (ref 6–20)
CO2: 23 mmol/L (ref 22–32)
Calcium: 8.9 mg/dL (ref 8.9–10.3)
Chloride: 105 mmol/L (ref 98–111)
Creatinine, Ser: 0.86 mg/dL (ref 0.44–1.00)
GFR, Estimated: >60 mL/min (ref >60)
Glucose, Bld: 136 mg/dL — ABNORMAL HIGH (ref 70–99)
Potassium: 3.2 mmol/L — ABNORMAL LOW (ref 3.5–5.1)
Sodium: 138 mmol/L (ref 135–145)
Total Bilirubin: 0.9 mg/dL (ref 0.3–1.2)
Total Protein: 7.8 g/dL (ref 6.5–8.1)

## 2020-09-01 LAB — CBC WITH DIFFERENTIAL/PLATELET
Abs Immature Granulocytes: 0.02 10*3/uL (ref 0.00–0.07)
Basophils Absolute: 0.1 10*3/uL (ref 0.0–0.1)
Basophils Relative: 1 %
Eosinophils Absolute: 0.2 10*3/uL (ref 0.0–0.5)
Eosinophils Relative: 2 %
HCT: 39.5 % (ref 36.0–46.0)
Hemoglobin: 12.6 g/dL (ref 12.0–15.0)
Immature Granulocytes: 0 %
Lymphocytes Relative: 23 %
Lymphs Abs: 1.6 10*3/uL (ref 0.7–4.0)
MCH: 28.9 pg (ref 26.0–34.0)
MCHC: 31.9 g/dL (ref 30.0–36.0)
MCV: 90.6 fL (ref 80.0–100.0)
Monocytes Absolute: 0.7 10*3/uL (ref 0.1–1.0)
Monocytes Relative: 10 %
Neutro Abs: 4.5 10*3/uL (ref 1.7–7.7)
Neutrophils Relative %: 64 %
Platelets: 317 10*3/uL (ref 150–400)
RBC: 4.36 MIL/uL (ref 3.87–5.11)
RDW: 15.3 % (ref 11.5–15.5)
WBC: 7 10*3/uL (ref 4.0–10.5)
nRBC: 0 % (ref 0.0–0.2)

## 2020-09-01 LAB — URINALYSIS, ROUTINE W REFLEX MICROSCOPIC
Bacteria, UA: NONE SEEN
Bilirubin Urine: NEGATIVE
Glucose, UA: NEGATIVE mg/dL
Ketones, ur: NEGATIVE mg/dL
Leukocytes,Ua: NEGATIVE
Nitrite: POSITIVE — AB
Protein, ur: >=300 mg/dL — AB
Specific Gravity, Urine: 1.02 (ref 1.005–1.030)
pH: 5 (ref 5.0–8.0)

## 2020-09-01 LAB — LIPASE, BLOOD: Lipase: 20 U/L (ref 11–51)

## 2020-09-01 MED ORDER — FENTANYL CITRATE (PF) 100 MCG/2ML IJ SOLN
100.0000 ug | Freq: Once | INTRAMUSCULAR | Status: AC
  Administered 2020-09-01: 100 ug via INTRAVENOUS
  Filled 2020-09-01: qty 2

## 2020-09-01 MED ORDER — ONDANSETRON HCL 4 MG/2ML IJ SOLN
4.0000 mg | Freq: Once | INTRAMUSCULAR | Status: AC
  Administered 2020-09-01: 4 mg via INTRAVENOUS
  Filled 2020-09-01: qty 2

## 2020-09-01 MED ORDER — ONDANSETRON HCL 8 MG PO TABS: 8.0000 mg | ORAL_TABLET | Freq: Three times a day (TID) | ORAL | 0 refills | Status: DC | PRN

## 2020-09-01 NOTE — ED Notes (Signed)
Pt back from CT

## 2020-09-01 NOTE — ED Provider Notes (Signed)
[facility] EMERGENCY DEPARTMENT Provider Note   CSN: [id number] Arrival date & time: 09/01/20  0524     History Chief Complaint  Patient presents with  . Abdominal Pain    Janet Mcguire is a 58 y.o. female.  The history is provided by the patient.  Abdominal Pain Pain location:  LUQ and LLQ Pain quality: cramping   Pain quality: not burning   Pain radiates to:  Does not radiate Pain severity:  Moderate Onset quality:  Gradual Duration:  2 days Timing:  Constant Progression:  Worsening Chronicity:  New Context: not alcohol use   Relieved by:  Nothing Worsened by:  Movement and palpation Associated symptoms: diarrhea   Associated symptoms: no chest pain, no dysuria, no fever and no shortness of breath   Patient with history of anxiety, hypertension presents with abdominal pain.  She reports left upper and left lower quadrant pain for the past 2 days.  She had a similar episode several years ago but is unsure what caused it.  No chest pain or shortness of breath.  No vomiting, but she has had nonbloody diarrhea  she denies any dysuria     Past Medical History:  Diagnosis Date  . Allergy   . Anxiety   . Depression   . Gastroesophageal reflux disease without esophagitis   . Hypertension   . Rectal irritation    chronic  . Substance abuse (HCC)    "years ago"    Patient Active Problem List   Diagnosis Date Noted  . Thyroid disease   . Gastroesophageal reflux disease without esophagitis   . SOB (shortness of breath) 12/28/2018  . Hypokalemia 10/09/2018  . Abdominal pain, chronic, epigastric 10/09/2018  . Rectal bleeding 07/08/2018  . Vitamin D deficiency 09/17/2016  . Literacy level of illiterate 09/16/2016  . Mental impairment 09/16/2016  . Colonoscopy refused 09/16/2016  . Elevated liver enzymes 01/11/2015  . Benign essential HTN 01/11/2015  . Depression 01/11/2015  . History of cholecystectomy 01/11/2015    Past Surgical History:  Procedure Laterality  Date  . CHOLECYSTECTOMY    . COLONOSCOPY N/A 09/10/2018   Procedure: COLONOSCOPY;  Surgeon: Malissa Hippo, MD;  Location: [facility];  Service: Endoscopy;  Laterality: N/A;  2:00  . ERCP N/A 01/12/2015   Procedure: ENDOSCOPIC RETROGRADE CHOLANGIOPANCREATOGRAPHY (ERCP) ;  Surgeon: Malissa Hippo, MD;  Location: [facility] ORS;  Service: Endoscopy;  Laterality: N/A;  Balloon Extraction  . SPHINCTEROTOMY N/A 01/12/2015   Procedure: SPHINCTEROTOMY;  Surgeon: Malissa Hippo, MD;  Location: [facility];  Service: Endoscopy;  Laterality: N/A;  . TUBAL LIGATION       OB History    Gravida  3   Para  1   Term  1   Preterm      AB  2   Living  1     SAB  2   TAB  0   Ectopic      Multiple      Live Births  1           Family History  Problem Relation Age of Onset  . Diabetes Mother   . Cancer Brother        throat  . COPD Sister   . Kidney disease Sister   . Diabetes Sister   . Diabetes Sister   . Kidney disease Sister     Social History   Tobacco Use  . Smoking status: Never Smoker  . Smokeless tobacco: Never Used  Vaping Use  . Vaping Use: Never used  Substance Use Topics  . Alcohol use: Not Currently  . Drug use: Yes    Types: Marijuana    Comment: last use Tuesday     Home Medications Prior to Admission medications   Medication Sig Start Date End Date Taking? Authorizing Provider  amLODipine (NORVASC) 10 MG tablet Take 10 mg by mouth daily.  12/01/18   [provider]  citalopram (CELEXA) 10 MG tablet Take 20 mg by mouth daily.  12/01/18   [provider]  diphenhydrAMINE (BENADRYL) 25 MG tablet Take 1 tablet (25 mg total) by mouth every 6 (six) hours as needed for itching or allergies. 12/29/18   Vassie Loll, MD  Estradiol 10 MCG TABS vaginal tablet Place 1 tablet vaginally 2 (two) times a week.  12/03/18   [provider]  metoprolol tartrate (LOPRESSOR) 25 MG tablet Take 1 tablet (25 mg total) by mouth 2 (two) times daily.  02/10/19   Antoine Poche, MD  nystatin-triamcinolone ointment [facility]) Apply 1 application topically 2 (two) times daily. 07/07/20   Raeford Razor, MD  pantoprazole (PROTONIX) 20 MG tablet Take 1 tablet (20 mg total) by mouth daily. 12/29/18 12/29/19  Vassie Loll, MD  thyroid (NP THYROID) 30 MG tablet Take 1 tablet by mouth daily. 09/21/18   [provider]    Allergies    Ibuprofen  Review of Systems   Review of Systems  Constitutional: Negative for fever.  Respiratory: Negative for shortness of breath.   Cardiovascular: Negative for chest pain.  Gastrointestinal: Positive for abdominal pain and diarrhea. Negative for blood in stool.  Genitourinary: Negative for dysuria.  All other systems reviewed and are negative.   Physical Exam Updated Vital Signs BP (!) 170/72   Pulse 90   Temp 98.2 F (36.8 C)   Resp 18   Ht 1.854 m (6\' 1" )   Wt 114.8 kg   LMP  (LMP Unknown) Comment: last  one years ago   SpO2 99%   BMI 33.38 kg/m   Physical Exam CONSTITUTIONAL: Well developed/well nourished, uncomfortable appearing HEAD: Normocephalic/atraumatic EYES: EOMI/PERRL ENMT: Mucous membranes moist NECK: supple no meningeal signs SPINE/BACK:entire spine nontender CV: S1/S2 noted, no murmurs/rubs/gallops noted LUNGS: Lungs are clear to auscultation bilaterally, no apparent distress ABDOMEN: soft, moderate LUQ/LLQ tenderness, no rebound or guarding, bowel sounds noted throughout abdomen GU:no cva tenderness NEURO: Pt is awake/alert/appropriate, moves all extremitiesx4.  No facial droop.   EXTREMITIES: pulses normal/equal, full ROM SKIN: warm, color normal PSYCH: no abnormalities of mood noted, alert and oriented to situation  ED Results / Procedures / Treatments   Labs (all labs ordered are listed, but only abnormal results are displayed) Labs Reviewed  URINALYSIS, ROUTINE W REFLEX MICROSCOPIC - Abnormal; Notable for the following components:      Result Value    APPearance HAZY (*)    Hgb urine dipstick MODERATE (*)    Protein, ur >=300 (*)    Nitrite POSITIVE (*)    All other components within normal limits  CBC WITH DIFFERENTIAL/PLATELET  COMPREHENSIVE METABOLIC PANEL  LIPASE, BLOOD    EKG EKG Interpretation  Date/Time:  Friday September 01 2020 06:05:29 EST Ventricular Rate:  64 PR Interval:    QRS Duration: 98 QT Interval:  401 QTC Calculation: 414 R Axis:   2 Text Interpretation: Sinus rhythm RSR' in V1 or V2, right VCD or RVH Baseline wander in lead(s) V1 No significant change since last tracing Interpretation limited secondary  to artifact Confirmed by Zadie Rhine ([id number]) on 09/01/2020 6:08:02 AM   Radiology No results found.  Procedures Procedures    Medications Ordered in ED Medications  fentaNYL (SUBLIMAZE) injection 100 mcg (has no administration in time range)  ondansetron (ZOFRAN) injection 4 mg (has no administration in time range)    ED Course  I have reviewed the triage vital signs and the nursing notes.  Pertinent labs   results that were available during my care of the patient were reviewed by me and considered in my medical decision making (see chart for details).    MDM Rules/Calculators/A&P                           This patient presents to the ED for concern of abdominal pain, this involves an extensive number of treatment options, and is a complaint that carries with it a high risk of complications and morbidity.  The differential diagnosis includes pancreatitis, gastritis, bowel perforation, pyelonephritis, kidney stones, bowel obstruction   Lab Tests:   I Ordered, reviewed, and interpreted labs, which included electrolytes, liver function panel, complete blood count, lipase, urinalysis  Medicines ordered:   I ordered medication fentanyl for pain  Imaging Studies ordered:   I ordered imaging studies which included CT imaging    Additional history obtained:    Previous records  obtained and reviewed   .  7:15 AM Patient previous history cholecystectomy, also reports sphincterotomy previously, presents with left upper abdominal pain. Patient had persistent pain after pain medications, CT imaging pending. ?  Hematuria, patient denies any urinary complaints Signed out to dr Effie Shy at shift change Final Clinical Impression(s) / ED Diagnoses Final diagnoses:  None    Rx / DC Orders ED Discharge Orders    None       Zadie Rhine, MD 09/01/20 [phone]

## 2020-09-01 NOTE — Discharge Instructions (Signed)
The testing today did not show any serious problems.  It is not clear what is causing your nausea.  We sent a prescription for nausea to your pharmacy.  Start with a clear liquid diet this morning then gradually advance to regular foods after a day or 2.  Call your doctor for follow-up appointment if you are not better by Monday.  Return here, if needed for problems.

## 2020-09-01 NOTE — ED Triage Notes (Signed)
Pt c/o left sided abdominal pain x 2 days

## 2020-09-01 NOTE — ED Notes (Signed)
Pt being transported to CT at this time. 

## 2020-09-01 NOTE — ED Provider Notes (Signed)
7:15 AM-checkout from Dr. Bebe Shaggy to evaluate after CT imaging returns to determine a source of abdominal pain.  7:45 AM-renal CT abdomen pelvis, negative.  Repeat vital signs with normalized blood pressure, otherwise stable normal.  At this time she is calm comfortable.  She complains of nausea.  Findings discussed with the patient and all questions were answered.   Mancel Bale, MD 09/01/20 [phone]

## 2020-09-03 ENCOUNTER — Other Ambulatory Visit: Payer: Self-pay

## 2020-09-03 ENCOUNTER — Emergency Department (HOSPITAL_COMMUNITY)
Admission: EM | Admit: 2020-09-03 | Discharge: 2020-09-03 | Disposition: A | Discharge status: Home / Self Care | Payer: Medicaid Other | Attending: Emergency Medicine | Admitting: Emergency Medicine

## 2020-09-03 ENCOUNTER — Encounter (HOSPITAL_COMMUNITY): Payer: Self-pay

## 2020-09-03 ENCOUNTER — Emergency Department (HOSPITAL_COMMUNITY): Payer: Medicaid Other

## 2020-09-03 DIAGNOSIS — Z79899 Other long term (current) drug therapy: Secondary | ICD-10-CM | POA: Diagnosis not present

## 2020-09-03 DIAGNOSIS — R300 Dysuria: Secondary | ICD-10-CM | POA: Diagnosis not present

## 2020-09-03 DIAGNOSIS — Z20822 Contact with and (suspected) exposure to covid-19: Secondary | ICD-10-CM | POA: Insufficient documentation

## 2020-09-03 DIAGNOSIS — I10 Essential (primary) hypertension: Secondary | ICD-10-CM | POA: Diagnosis not present

## 2020-09-03 DIAGNOSIS — R109 Unspecified abdominal pain: Secondary | ICD-10-CM | POA: Diagnosis present

## 2020-09-03 LAB — CBC WITH DIFFERENTIAL/PLATELET
Abs Immature Granulocytes: 0.04 10*3/uL (ref 0.00–0.07)
Basophils Absolute: 0.1 10*3/uL (ref 0.0–0.1)
Basophils Relative: 1 %
Eosinophils Absolute: 0.1 10*3/uL (ref 0.0–0.5)
Eosinophils Relative: 1 %
HCT: 39.7 % (ref 36.0–46.0)
Hemoglobin: 12.7 g/dL (ref 12.0–15.0)
Immature Granulocytes: 0 %
Lymphocytes Relative: 21 %
Lymphs Abs: 2.1 10*3/uL (ref 0.7–4.0)
MCH: 29.5 pg (ref 26.0–34.0)
MCHC: 32 g/dL (ref 30.0–36.0)
MCV: 92.1 fL (ref 80.0–100.0)
Monocytes Absolute: 0.9 10*3/uL (ref 0.1–1.0)
Monocytes Relative: 10 %
Neutro Abs: 6.5 10*3/uL (ref 1.7–7.7)
Neutrophils Relative %: 67 %
Platelets: 316 10*3/uL (ref 150–400)
RBC: 4.31 MIL/uL (ref 3.87–5.11)
RDW: 14.8 % (ref 11.5–15.5)
WBC: 9.7 10*3/uL (ref 4.0–10.5)
nRBC: 0 % (ref 0.0–0.2)

## 2020-09-03 LAB — URINALYSIS, ROUTINE W REFLEX MICROSCOPIC
Bilirubin Urine: NEGATIVE
Glucose, UA: NEGATIVE mg/dL
Ketones, ur: NEGATIVE mg/dL
Leukocytes,Ua: NEGATIVE
Nitrite: POSITIVE — AB
Protein, ur: >=300 mg/dL — AB
Specific Gravity, Urine: 1.024 (ref 1.005–1.030)
pH: 5 (ref 5.0–8.0)

## 2020-09-03 LAB — COMPREHENSIVE METABOLIC PANEL
ALT: 24 U/L (ref 0–44)
AST: 17 U/L (ref 15–41)
Albumin: 3.8 g/dL (ref 3.5–5.0)
Alkaline Phosphatase: 70 U/L (ref 38–126)
Anion gap: 6 (ref 5–15)
BUN: 10 mg/dL (ref 6–20)
CO2: 27 mmol/L (ref 22–32)
Calcium: 9 mg/dL (ref 8.9–10.3)
Chloride: 105 mmol/L (ref 98–111)
Creatinine, Ser: 0.96 mg/dL (ref 0.44–1.00)
GFR, Estimated: >60 mL/min (ref >60)
Glucose, Bld: 134 mg/dL — ABNORMAL HIGH (ref 70–99)
Potassium: 3.4 mmol/L — ABNORMAL LOW (ref 3.5–5.1)
Sodium: 138 mmol/L (ref 135–145)
Total Bilirubin: 0.9 mg/dL (ref 0.3–1.2)
Total Protein: 7.4 g/dL (ref 6.5–8.1)

## 2020-09-03 LAB — RESPIRATORY PANEL BY RT PCR (FLU A&B, COVID)
Influenza A by PCR: NEGATIVE
Influenza B by PCR: NEGATIVE
SARS Coronavirus 2 by RT PCR: NEGATIVE

## 2020-09-03 LAB — LIPASE, BLOOD: Lipase: 22 U/L (ref 11–51)

## 2020-09-03 MED ORDER — ONDANSETRON 8 MG PO TBDP: 8.0000 mg | ORAL_TABLET | Freq: Three times a day (TID) | ORAL | 0 refills | Status: DC | PRN

## 2020-09-03 MED ORDER — MORPHINE SULFATE (PF) 4 MG/ML IV SOLN
4.0000 mg | Freq: Once | INTRAVENOUS | Status: AC
  Administered 2020-09-03: 4 mg via INTRAVENOUS
  Filled 2020-09-03: qty 1

## 2020-09-03 MED ORDER — ALUM & MAG HYDROXIDE-SIMETH 200-200-20 MG/5ML PO SUSP
30.0000 mL | Freq: Once | ORAL | Status: AC
  Administered 2020-09-03: 30 mL via ORAL
  Filled 2020-09-03: qty 30

## 2020-09-03 MED ORDER — SODIUM CHLORIDE 0.9 % IV BOLUS
1000.0000 mL | Freq: Once | INTRAVENOUS | Status: AC
  Administered 2020-09-03: 1000 mL via INTRAVENOUS

## 2020-09-03 MED ORDER — CEPHALEXIN 500 MG PO CAPS: 500.0000 mg | ORAL_CAPSULE | Freq: Two times a day (BID) | ORAL | 0 refills | Status: AC

## 2020-09-03 MED ORDER — ONDANSETRON HCL 4 MG/2ML IJ SOLN
4.0000 mg | Freq: Once | INTRAMUSCULAR | Status: AC
  Administered 2020-09-03: 4 mg via INTRAVENOUS
  Filled 2020-09-03: qty 2

## 2020-09-03 NOTE — ED Notes (Signed)
PO sprite given. Pt tolerated well.

## 2020-09-03 NOTE — ED Triage Notes (Signed)
Pt was seen 2 days ago for same complaints. Left sided abdominal pain. States she cant eat and feels weak and has nausea

## 2020-09-03 NOTE — ED Provider Notes (Signed)
[facility] Provider Note   CSN: [id number] Arrival date & time: 09/03/20  [id number]     History Chief Complaint  Patient presents with  . Abdominal Pain    Janet Mcguire is a 59 y.o. female with past medical history significant for anxiety, GERD, hypertension, rectal irritation, substance abuse.  Abdominal surgical history includes cholecystectomy.  HPI Patient presents to emergency department today with chief complaint of left-sided abdominal pain x4 days.  Janet Mcguire states the pain is located in her left upper lower quadrant and left lower lower quadrant.  The pain does not radiate.  Janet Mcguire states the pain is intermittent.  Janet Mcguire describes the pain as sharp and throbbing.  Janet Mcguire states the pain is worse with any kind of movement.  Janet Mcguire has had decreased p.o. intake secondary to pain and nausea.  Has not had any episodes of emesis.  Janet Mcguire tried taking Tylenol at home without any symptom improvement.  Janet Mcguire states Janet Mcguire last had a bowel movement x2 days ago and it was diarrhea, denies seeing any blood in stool.  Janet Mcguire has had urinary frequency with dysuria noting her urine was dark in color this morning.  Janet Mcguire denies any fever, chills, cough, congestion, shortness of breath, chest pain, back pain, pelvic pain, vaginal discharge, abnormal vaginal bleeding, gross hematuria.  Janet Mcguire states Janet Mcguire is not sexually active.    Past Medical History:  Diagnosis Date  . Allergy   . Anxiety   . Depression   . Gastroesophageal reflux disease without esophagitis   . Hypertension   . Rectal irritation    chronic  . Substance abuse (HCC)    "years ago"    Patient Active Problem List   Diagnosis Date Noted  . Thyroid disease   . Gastroesophageal reflux disease without esophagitis   . SOB (shortness of breath) 12/28/2018  . Hypokalemia 10/09/2018  . Abdominal pain, chronic, epigastric 10/09/2018  . Rectal bleeding 07/08/2018  . Vitamin D deficiency 09/17/2016  . Literacy level of illiterate 09/16/2016    . Mental impairment 09/16/2016  . Colonoscopy refused 09/16/2016  . Elevated liver enzymes 01/11/2015  . Benign essential HTN 01/11/2015  . Depression 01/11/2015  . History of cholecystectomy 01/11/2015    Past Surgical History:  Procedure Laterality Date  . CHOLECYSTECTOMY    . COLONOSCOPY N/A 09/10/2018   Procedure: COLONOSCOPY;  Surgeon: Malissa Hippoehman, Najeeb U, MD;  Location: [facility];  Service: Endoscopy;  Laterality: N/A;  2:00  . ERCP N/A 01/12/2015   Procedure: ENDOSCOPIC RETROGRADE CHOLANGIOPANCREATOGRAPHY (ERCP) ;  Surgeon: Malissa HippoNajeeb U Rehman, MD;  Location: [facility] ORS;  Service: Endoscopy;  Laterality: N/A;  Balloon Extraction  . SPHINCTEROTOMY N/A 01/12/2015   Procedure: SPHINCTEROTOMY;  Surgeon: Malissa HippoNajeeb U Rehman, MD;  Location: [facility] ORS;  Service: Endoscopy;  Laterality: N/A;  . TUBAL LIGATION       OB History    Gravida  3   Para  1   Term  1   Preterm      AB  2   Living  1     SAB  2   TAB  0   Ectopic      Multiple      Live Births  1           Family History  Problem Relation Age of Onset  . Diabetes Mother   . Cancer Brother        throat  . COPD Sister   . Kidney disease Sister   .  Diabetes Sister   . Diabetes Sister   . Kidney disease Sister     Social History   Tobacco Use  . Smoking status: Never Smoker  . Smokeless tobacco: Never Used  Vaping Use  . Vaping Use: Never used  Substance Use Topics  . Alcohol use: Not Currently  . Drug use: Yes    Types: Marijuana    Comment: last use Tuesday     Home Medications Prior to Admission medications   Medication Sig Start Date End Date Taking? Authorizing Provider  amLODipine (NORVASC) 10 MG tablet Take 10 mg by mouth daily.  12/01/18   [provider]  cephALEXin (KEFLEX) 500 MG capsule Take 1 capsule (500 mg total) by mouth 2 (two) times daily for 5 days. 09/03/20 09/08/20  Walisiewicz, Yvonna Alanis E, PA-C  citalopram (CELEXA) 10 MG tablet Take 20 mg by mouth daily.  12/01/18    [provider]  diphenhydrAMINE (BENADRYL) 25 MG tablet Take 1 tablet (25 mg total) by mouth every 6 (six) hours as needed for itching or allergies. 12/29/18   Vassie Loll, MD  Estradiol 10 MCG TABS vaginal tablet Place 1 tablet vaginally 2 (two) times a week.  12/03/18   [provider]  metoprolol tartrate (LOPRESSOR) 25 MG tablet Take 1 tablet (25 mg total) by mouth 2 (two) times daily. 02/10/19   Antoine Poche, MD  nystatin-triamcinolone ointment [facility]) Apply 1 application topically 2 (two) times daily. 07/07/20   Raeford Razor, MD  ondansetron (ZOFRAN ODT) 8 MG disintegrating tablet Take 1 tablet (8 mg total) by mouth every 8 (eight) hours as needed for nausea or vomiting. 09/03/20   Walisiewicz, Ica Daye E, PA-C  pantoprazole (PROTONIX) 20 MG tablet Take 1 tablet (20 mg total) by mouth daily. 12/29/18 12/29/19  Vassie Loll, MD  thyroid (NP THYROID) 30 MG tablet Take 1 tablet by mouth daily. 09/21/18   [provider]    Allergies    Ibuprofen  Review of Systems   Review of Systems All other systems are reviewed and are negative for acute change except as noted in the HPI.  Physical Exam Updated Vital Signs BP 135/86 (BP Location: Left Arm)   Pulse 68   Temp 98.6 F (37 C) (Oral)   Resp 16   Ht 6\' 1"  (1.854 m)   Wt 114 kg   LMP  (LMP Unknown) Comment: last  one years ago   SpO2 97%   BMI 33.16 kg/m   Physical Exam Vitals and nursing note reviewed.  Constitutional:      General: Janet Mcguire is not in acute distress.    Appearance: Janet Mcguire is not ill-appearing.  HENT:     Head: Normocephalic and atraumatic.     Right Ear: Tympanic membrane and external ear normal.     Left Ear: Tympanic membrane and external ear normal.     Nose: Nose normal.     Mouth/Throat:     Mouth: Mucous membranes are dry.     Pharynx: Oropharynx is clear.  Eyes:     General: No scleral icterus.       Right eye: No discharge.        Left eye: No discharge.      Extraocular Movements: Extraocular movements intact.     Conjunctiva/sclera: Conjunctivae normal.     Pupils: Pupils are equal, round, and reactive to light.  Neck:     Vascular: No JVD.  Cardiovascular:     Rate and Rhythm: Normal rate and  regular rhythm.     Pulses: Normal pulses.          Radial pulses are 2+ on the right side and 2+ on the left side.     Heart sounds: Normal heart sounds.  Pulmonary:     Comments: Lungs clear to auscultation in all fields. Symmetric chest rise. No wheezing, rales, or rhonchi. Chest:     Chest wall: No tenderness.  Abdominal:     General: Bowel sounds are normal.     Tenderness: There is abdominal tenderness in the left upper quadrant and left lower quadrant. There is left CVA tenderness.     Comments: Abdomen is soft, non-distended. No rigidity, no guarding. No peritoneal signs.  Genitourinary:    Comments: Patient defers pelvic exam Musculoskeletal:        General: Normal range of motion.     Cervical back: Normal range of motion.  Skin:    General: Skin is warm and dry.     Capillary Refill: Capillary refill takes less than 2 seconds.  Neurological:     Mental Status: Janet Mcguire is oriented to person, place, and time.     GCS: GCS eye subscore is 4. GCS verbal subscore is 5. GCS motor subscore is 6.     Comments: Fluent speech, no facial droop.  Psychiatric:        Mood and Affect: Affect is tearful.        Behavior: Behavior normal.     ED Results / Procedures / Treatments   Labs (all labs ordered are listed, but only abnormal results are displayed) Labs Reviewed  COMPREHENSIVE METABOLIC PANEL - Abnormal; Notable for the following components:      Result Value   Potassium 3.4 (*)    Glucose, Bld 134 (*)    All other components within normal limits  URINALYSIS, ROUTINE W REFLEX MICROSCOPIC - Abnormal; Notable for the following components:   Color, Urine AMBER (*)    APPearance HAZY (*)    Hgb urine dipstick MODERATE (*)    Protein, ur  >=300 (*)    Nitrite POSITIVE (*)    Bacteria, UA MANY (*)    All other components within normal limits  RESPIRATORY PANEL BY RT PCR (FLU A&B, COVID)  URINE CULTURE  CBC WITH DIFFERENTIAL/PLATELET  LIPASE, BLOOD    EKG None  Radiology US Renal  Result Date: 09/03/2020 CLINICAL DATA:  Intermittent left flank pain EXAM: RENAL / URINARY TRACT ULTRASOUND COMPLETE COMPARISON:  None. FINDINGS: Right Kidney: Renal measurements: 11.1 x 5.2 x 6.3 cm = volume: 180.5 mL. Echogenicity and renal cortical thickness are within normal limits. No mass, perinephric fluid, or hydronephrosis visualized. No sonographically demonstrable calculus or ureterectasis. Left Kidney: Renal measurements: 10.9 x 5.7 x 5.7 cm = volume: 187.1 mL. Echogenicity and renal cortical thickness are within normal limits. No mass, perinephric fluid, or hydronephrosis visualized. No sonographically demonstrable calculus or ureterectasis. Bladder: Appears normal for degree of bladder distention. Other: None. IMPRESSION: Study within normal limits. Electronically Signed   By: Bretta Bang III M.D.   On: 09/03/2020 10:50    Procedures Procedures (including critical care time)  Medications Ordered in ED Medications  sodium chloride 0.9 % bolus 1,000 mL (0 mLs Intravenous Stopped 09/03/20 1119)  ondansetron (ZOFRAN) injection 4 mg (4 mg Intravenous Given 09/03/20 0955)  morphine 4 MG/ML injection 4 mg (4 mg Intravenous Given 09/03/20 0955)  alum & mag hydroxide-simeth (MAALOX/MYLANTA) 200-200-20 MG/5ML suspension 30 mL (30 mLs Oral Given 09/03/20  1119)    ED Course  I have reviewed the triage vital signs and the nursing notes.  Pertinent labs & imaging results that were available during my care of the patient were reviewed by me and considered in my medical decision making (see chart for details).    MDM Rules/Calculators/A&P                          History provided by patient with additional history obtained from  chart review.    Second ED visit for abdominal pain in 2 days. Afebrile, hemodynamically stable.  Janet Mcguire is nontoxic in appearance however does look uncomfortable.  Janet Mcguire is tearful.  Looks dehydrated, mucus membranes are dry. Lungs are clear to auscultation.  On exam Janet Mcguire has tenderness palpation of left upper quadrant left lower quadrant. No peritoneal signs.  Does also have left CVA tenderness.  Janet Mcguire had a CT renal at last visit, I viewed results which are overall unremarkable.  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 Given 1 L IVF, analgesics and antiemetics. Deferens pelvic exam as Janet Mcguire has is not sexually active and denies any vaginal symptoms.   CBC and CMP overall unremarkable.  Lipase within normal range. UA with positive nitrites.  This could possibly indicate infection as Janet Mcguire is symptomatic.  Review of previous urine culture shows back in 2019 Janet Mcguire had culture with Klebsiella pneumoniae, it was sensitive to cefazolin. Culture sent today. Will treat with keflex for now. Covid and influenza tests are negative. Renal ultrasound also unremarkable, no acute findings. On reassessment Janet Mcguire is tolerating p.o. intake.  Serial abdominal exams are benign, no peritoneal signs.  Exam does not suggest acute surgical abdomen.  Will discharge patient home and recommend close follow-up with PCP.  The patient appears reasonably screened and/or stabilized for discharge and I doubt any other medical condition or other [facility] requiring further screening, evaluation, or treatment in the ED at this time prior to discharge. The patient is safe for discharge with strict return precautions discussed. Findings and plan of care discussed with supervising physician Dr. Effie Shy.  Zareena Willis was evaluated in Emergency Department on 09/03/2020 for the symptoms described in the history of present illness. Janet Mcguire was evaluated in the context of  the global COVID-19 pandemic, which necessitated consideration that the patient might be at risk for infection with the SARS-CoV-2 virus that causes COVID-19. Institutional protocols and algorithms that pertain to the evaluation of patients at risk for COVID-19 are in a state of rapid change based on information released by regulatory bodies including the CDC and federal and state organizations. These policies and algorithms were followed during the patient's care in the ED.  Portions of this note were generated with Scientist, clinical (histocompatibility and immunogenetics). Dictation errors may occur despite best attempts at proofreading.    Final Clinical Impression(s) / ED Diagnoses Final diagnoses:  Abdominal pain, unspecified abdominal location  Dysuria    Rx / DC Orders ED Discharge Orders         Ordered    cephALEXin (KEFLEX) 500 MG capsule  2 times daily        09/03/20 1154    ondansetron (ZOFRAN ODT) 8 MG disintegrating tablet  Every 8 hours PRN        09/03/20 1154           Shanon Ace, PA-C 09/03/20 1202    Mancel Bale, MD  09/03/20 1500  

## 2020-09-03 NOTE — Discharge Instructions (Addendum)
Prescription was sent to your pharmacy for Keflex.  This is an antibiotic used to treat urinary tract infection.  Please take as prescribed.  Prescription also sent for Zofran.  This is a nausea medicine that dissolves under your tongue.  Take if needed.  Follow-up with your primary care doctor as soon as possible to have your symptoms rechecked.  Your Covid test today was negative.

## 2020-09-05 ENCOUNTER — Ambulatory Visit (INDEPENDENT_AMBULATORY_CARE_PROVIDER_SITE_OTHER): Payer: Medicaid Other | Admitting: Internal Medicine

## 2020-09-05 ENCOUNTER — Encounter: Payer: Self-pay | Admitting: Internal Medicine

## 2020-09-05 ENCOUNTER — Other Ambulatory Visit: Payer: Self-pay

## 2020-09-05 VITALS — BP 128/68 | HR 68 | Temp 99.4°F | Resp 18 | Ht 73.0 in | Wt 245.1 lb

## 2020-09-05 DIAGNOSIS — Z131 Encounter for screening for diabetes mellitus: Secondary | ICD-10-CM

## 2020-09-05 DIAGNOSIS — I10 Essential (primary) hypertension: Secondary | ICD-10-CM

## 2020-09-05 DIAGNOSIS — F32A Depression, unspecified: Secondary | ICD-10-CM

## 2020-09-05 DIAGNOSIS — R1013 Epigastric pain: Secondary | ICD-10-CM

## 2020-09-05 DIAGNOSIS — N3000 Acute cystitis without hematuria: Secondary | ICD-10-CM | POA: Diagnosis not present

## 2020-09-05 DIAGNOSIS — F1290 Cannabis use, unspecified, uncomplicated: Secondary | ICD-10-CM | POA: Insufficient documentation

## 2020-09-05 DIAGNOSIS — Z7689 Persons encountering health services in other specified circumstances: Secondary | ICD-10-CM | POA: Diagnosis not present

## 2020-09-05 DIAGNOSIS — G8929 Other chronic pain: Secondary | ICD-10-CM

## 2020-09-05 DIAGNOSIS — R7303 Prediabetes: Secondary | ICD-10-CM

## 2020-09-05 DIAGNOSIS — K219 Gastro-esophageal reflux disease without esophagitis: Secondary | ICD-10-CM

## 2020-09-05 LAB — POCT GLYCOSYLATED HEMOGLOBIN (HGB A1C)
HbA1c POC (<> result, manual entry): 5.8 % (ref 4.0–5.6)
HbA1c, POC (controlled diabetic range): 5.8 % (ref 0.0–7.0)
HbA1c, POC (prediabetic range): 5.8 % (ref 5.7–6.4)
Hemoglobin A1C: 5.8 % — AB (ref 4.0–5.6)

## 2020-09-05 MED ORDER — PANTOPRAZOLE SODIUM 20 MG PO TBEC: 20.0000 mg | DELAYED_RELEASE_TABLET | Freq: Every day | ORAL | 3 refills | Status: DC

## 2020-09-05 NOTE — Progress Notes (Signed)
New Patient Office Visit  Subjective:  Patient ID: Janet Mcguire, female    DOB: 06-12-1961  Age: 59 y.o. MRN: [id number]  CC:  Chief Complaint  Patient presents with   New Patient (Initial Visit)    new pt has been having left side pain and burning for about a week also Janet Mcguire has had no appetite the last week was seen in er sunday for uti     HPI Janet Peachina Steuber is a 59 year old female with past medical history of hypertension, depression, GERD, chronic epigastric pain, marijuana use and mild learning disability who presents for establishing care.  Patient had recent ER visit for complaint of left lower quadrant pain, for which CT abdomen and US renal was unremarkable.  Her UA showed positive nitrite and many bacteria - UTI, for which Janet Mcguire has been taking cephalexin.  Janet Mcguire still complains of dysuria and LLQ pain, which is intermittent, 7-8/10, and is associated with nausea and vomiting.  Of note, Janet Mcguire used to smoke marijuana till the last week.  Patient denies any fever, chills, urinary frequency or hesitancy.  BP is well-controlled. Takes medications regularly. Patient denies headache, dizziness, chest pain, dyspnea or palpitations.  Janet Mcguire also takes fluoxetine mainly for hot flashes and also has a history of depression.  Janet Mcguire denies anxious mood, recent weight change, or suicidal ideation.  Janet Mcguire takes Zyrtec for seasonal allergies.  Last colonoscopy in 08/2018. Last PAP smear in 2020. Last Mammography in 06/2020.  Patient has had both doses of COVID vaccines.  Patient lives alone despite likely learning disability and minimal to no social support.  Past Medical History:  Diagnosis Date   Allergy    Anxiety    Depression    Gastroesophageal reflux disease without esophagitis    Hypertension    Rectal bleeding 07/08/2018   Added automatically from request for surgery [id number]   Rectal irritation    chronic   Substance abuse (HCC)    "years ago"    Past Surgical History:    Procedure Laterality Date   CHOLECYSTECTOMY     COLONOSCOPY N/A 09/10/2018   Procedure: COLONOSCOPY;  Surgeon: Malissa Hippoehman, Najeeb U, MD;  [facility];  Service: Endoscopy;  Laterality: N/A;  2:00   ERCP N/A 01/12/2015   Procedure: ENDOSCOPIC RETROGRADE CHOLANGIOPANCREATOGRAPHY (ERCP) ;  Surgeon: Malissa HippoNajeeb U Rehman, MD;  Location: [facility];  Service: Endoscopy;  Laterality: N/A;  Balloon Extraction   SPHINCTEROTOMY N/A 01/12/2015   Procedure: SPHINCTEROTOMY;  Surgeon: Malissa HippoNajeeb U Rehman, MD;  Location: [facility] OR[facility];  Service: Endoscopy;  Laterality: N/A;   TUBAL LIGATION      Family History  Problem Relation Age of Onset   Diabetes Mother    Cancer Brother        throat   COPD Sister    Kidney disease Sister    Diabetes Sister    Diabetes Sister    Kidney disease Sister     Social History   Socioeconomic History   Marital status: Single    Spouse name: Not on file   Number of children: 1   Years of education: 8   Highest education level: Not on file  Occupational History   Occupation: disabled    Comment: "slow learner"  Tobacco Use   Smoking status: Never Smoker   Smokeless tobacco: Never Used  Building services engineerVaping Use   Vaping Use: Never used  Substance and Sexual Activity   Alcohol use: Not Currently   Drug use: Yes    Types: Marijuana  Comment: last use Tuesday    Sexual activity: Not Currently    Birth control/protection: Surgical    Comment: tubal  Other Topics Concern   Not on file  Social History Narrative   Lives alone   Manages own household   "slow learner"   Walks for exercise   Does not drive   Does not read well   Youngest of 13 children [place]   Social Determinants of Health   Financial Resource Strain:    Difficulty of Paying Living Expenses: Not on file  Food Insecurity:    Worried About Programme researcher, broadcasting/film/video in the Last Year: Not on file   [employer] of Food in the Last Year: Not on file  Transportation Needs:     Lack of Transportation (Medical): Not on file   Lack of Transportation (Non-Medical): Not on file  Physical Activity:    Days of Exercise per Week: Not on file   Minutes of Exercise per Session: Not on file  Stress:    Feeling of Stress : Not on file  Social Connections:    Frequency of Communication with Friends and Family: Not on file   Frequency of Social Gatherings with Friends and Family: Not on file   Attends Religious Services: Not on file   Active Member of Clubs or Organizations: Not on file   Attends Banker Meetings: Not on file   Marital Status: Not on file  Intimate Partner Violence:    Fear of Current or Ex-Partner: Not on file   Emotionally Abused: Not on file   Physically Abused: Not on file   Sexually Abused: Not on file    ROS Review of Systems  Constitutional: Negative for chills and fever.  HENT: Negative for congestion, sinus pressure, sinus pain and sore throat.   Eyes: Negative for pain and discharge.  Respiratory: Negative for cough and shortness of breath.   Cardiovascular: Negative for chest pain and palpitations.  Gastrointestinal: Positive for abdominal pain and nausea. Negative for blood in stool, constipation and diarrhea.  Endocrine: Negative for polydipsia and polyuria.  Genitourinary: Positive for dysuria. Negative for decreased urine volume, frequency, hematuria, urgency, vaginal discharge and vaginal pain.  Musculoskeletal: Negative for neck pain and neck stiffness.  Skin: Negative for rash.  Neurological: Negative for dizziness and weakness.  Psychiatric/Behavioral: Negative for agitation, behavioral problems and suicidal ideas. The patient is not hyperactive.     Objective:   Today's Vitals: BP 128/68 (BP Location: Right Arm, Patient Position: Sitting, Cuff Size: Normal)    Pulse 68    Temp 99.4 F (37.4 C) (Oral)    Resp 18    Ht 6\' 1"  (1.854 m)    Wt 245 lb 1.9 oz (111.2 kg)    LMP  (LMP Unknown) Comment: last   one years ago    SpO2 98%    BMI 32.34 kg/m   Physical Exam Vitals reviewed.  Constitutional:      General: Janet Mcguire is not in acute distress.    Appearance: Janet Mcguire is not diaphoretic.  HENT:     Head: Normocephalic and atraumatic.     Nose: Nose normal.     Mouth/Throat:     Mouth: Mucous membranes are moist.  Eyes:     General: No scleral icterus.    Extraocular Movements: Extraocular movements intact.     Pupils: Pupils are equal, round, and reactive to light.  Cardiovascular:     Rate and Rhythm: Normal  rate and regular rhythm.     Pulses: Normal pulses.     Heart sounds: Normal heart sounds. No murmur heard.   Pulmonary:     Breath sounds: Normal breath sounds. No wheezing or rales.  Abdominal:     Palpations: Abdomen is soft.     Tenderness: There is abdominal tenderness (LLQ and periumbilical). There is no right CVA tenderness, left CVA tenderness, guarding or rebound.  Musculoskeletal:     Cervical back: Neck supple. No tenderness.     Right lower leg: No edema.     Left lower leg: No edema.  Skin:    General: Skin is warm.     Findings: No rash.  Neurological:     General: No focal deficit present.     Mental Status: Janet Mcguire is alert and oriented to person, place, and time.     Sensory: No sensory deficit.     Motor: No weakness.  Psychiatric:        Mood and Affect: Mood normal.        Behavior: Behavior normal.     Assessment & Plan:   Problem List Items Addressed This Visit      Cardiovascular and Mediastinum   Benign essential HTN    BP Readings from Last 1 Encounters:  09/05/20 128/68   Well-controlled with Amlodipine and Metoprolol Counseled for compliance with the medications Advised DASH diet and moderate exercise/walking, at least 150 mins/week         Digestive   Gastroesophageal reflux disease    On Pantoprazole      Relevant Medications   pantoprazole (PROTONIX) 20 MG tablet     Genitourinary   Acute cystitis without hematuria    UA  shows positive nitrite and many bacteria, culture pending Continue Cephalexin Increase fluid intake        Other   Depression    On Fluoxetine, also for hot flashes      Relevant Medications   FLUoxetine (PROZAC) 20 MG capsule   Abdominal pain, chronic, epigastric    Likely related to GERD and/or marijuana use Cyclic nausea and vomiting related to marijuana use or withdrawal CT abdomen and US renal negative for acute abnormality. Zofran PRN for nausea/vomiting      Relevant Medications   meloxicam (MOBIC) 15 MG tablet   FLUoxetine (PROZAC) 20 MG capsule   Encounter to establish care - Primary    Care established Previous chart reviewed History and medications reviewed with the patient      Marijuana use    Avoid marijuana use due to nausea, vomiting and abdominal pain      Prediabetes    CMP reviewed HbA1C: 5.8 Advised to follow low carbohydrate diet and perform moderate exercise/walking at least 150 mins/week.       Other Visit Diagnoses    Diabetes mellitus screening       Relevant Orders   POCT glycosylated hemoglobin (Hb A1C) (Completed)      Outpatient Encounter Medications as of 09/05/2020  Medication Sig   amLODipine (NORVASC) 10 MG tablet Take 10 mg by mouth daily.    cephALEXin (KEFLEX) 500 MG capsule Take 1 capsule (500 mg total) by mouth 2 (two) times daily for 5 days.   cetirizine (ZYRTEC) 10 MG tablet Take 1 tablet by mouth daily.   clotrimazole-betamethasone (LOTRISONE) cream Apply topically 2 (two) times daily.   diphenhydrAMINE (BENADRYL) 25 MG tablet Take 1 tablet (25 mg total) by mouth every 6 (six) hours as needed  for itching or allergies.   FLUoxetine (PROZAC) 20 MG capsule Take 20 mg by mouth daily.   meloxicam (MOBIC) 15 MG tablet Take 1 tablet by mouth daily.   metoprolol tartrate (LOPRESSOR) 25 MG tablet Take 1 tablet (25 mg total) by mouth 2 (two) times daily.   mupirocin ointment (BACTROBAN) 2 % Apply topically.    nystatin-triamcinolone ointment (MYCOLOG) Apply 1 application topically 2 (two) times daily.   ondansetron (ZOFRAN ODT) 8 MG disintegrating tablet Take 1 tablet (8 mg total) by mouth every 8 (eight) hours as needed for nausea or vomiting.   [DISCONTINUED] citalopram (CELEXA) 10 MG tablet Take 20 mg by mouth daily.    pantoprazole (PROTONIX) 20 MG tablet Take 1 tablet (20 mg total) by mouth daily.   [DISCONTINUED] Estradiol 10 MCG TABS vaginal tablet Place 1 tablet vaginally 2 (two) times a week.  (Patient not taking: Reported on 09/05/2020)   [DISCONTINUED] pantoprazole (PROTONIX) 20 MG tablet Take 1 tablet (20 mg total) by mouth daily.   [DISCONTINUED] thyroid (NP THYROID) 30 MG tablet Take 1 tablet by mouth daily. (Patient not taking: Reported on 09/05/2020)   No facility-administered encounter medications on file as of 09/05/2020.    Follow-up: Return in about 4 months (around 01/03/2021).   Anabel Halon, MD

## 2020-09-05 NOTE — Assessment & Plan Note (Addendum)
Likely related to GERD and/or marijuana use Cyclic nausea and vomiting related to marijuana use or withdrawal CT abdomen and US renal negative for acute abnormality. Zofran PRN for nausea/vomiting

## 2020-09-05 NOTE — Assessment & Plan Note (Signed)
CMP reviewed HbA1C: 5.8 Advised to follow low carbohydrate diet and perform moderate exercise/walking at least 150 mins/week.

## 2020-09-05 NOTE — Assessment & Plan Note (Signed)
Avoid marijuana use due to nausea, vomiting and abdominal pain

## 2020-09-05 NOTE — Patient Instructions (Signed)
Please continue to take medications as prescribed.  Please complete the course of antibiotics as prescribed.  Please increase fluid intake, at least 2 l of water.  Please avoid using Marijuana. Taking hot shower can help with the nausea in your condition.  If you notice blood in stool, or shortness of breath with chest pain, please go to ER immediately.

## 2020-09-05 NOTE — Assessment & Plan Note (Signed)
Care established Previous chart reviewed History and medications reviewed with the patient 

## 2020-09-05 NOTE — Assessment & Plan Note (Signed)
UA shows positive nitrite and many bacteria, culture pending Continue Cephalexin Increase fluid intake

## 2020-09-05 NOTE — Assessment & Plan Note (Signed)
On Fluoxetine, also for hot flashes

## 2020-09-05 NOTE — Assessment & Plan Note (Signed)
On Pantoprazole 

## 2020-09-05 NOTE — Assessment & Plan Note (Signed)
BP Readings from Last 1 Encounters:  09/05/20 128/68   Well-controlled with Amlodipine and Metoprolol Counseled for compliance with the medications Advised DASH diet and moderate exercise/walking, at least 150 mins/week

## 2020-09-06 LAB — URINE CULTURE: Culture: >=100000 — AB

## 2020-09-07 ENCOUNTER — Telehealth: Payer: Self-pay

## 2020-09-07 NOTE — Telephone Encounter (Signed)
Post ED Visit - Positive Culture Follow-up: Unsuccessful Patient Follow-up  Culture assessed and recommendations reviewed by:  []  , Pharm.D. []  Enzo Bi, Pharm.D., BCPS AQ-ID []  , Pharm.D., BCPS []  Celedonio Miyamoto, Pharm.D., BCPS []  Benson, Garvin Fila.D., BCPS, AAHIVP []  , Pharm.D., BCPS, AAHIVP []  Georgina Pillion, PharmD []  , PharmD, BCPS [facility] Pharm D Positive urine culture  []  Patient discharged without antimicrobial prescription and treatment is now indicated [x]  Organism is resistant to prescribed ED discharge antimicrobial []  Patient with positive blood cultures Needs Macrobid 100 mg BID x 5 days per [address] [place]  Unable to contact patient after 3 attempts, letter will be sent to address on file  09/07/2020, 10:45 AM

## 2020-09-07 NOTE — Progress Notes (Signed)
ED Antimicrobial Stewardship Positive Culture Follow Up   Janet Mcguire is an 59 y.o. female who presented to [facility] on 09/03/2020 with a chief complaint of abdominal pain x 4 days, urinary frequency and dysuria.  Chief Complaint  Patient presents with  . Abdominal Pain    Recent Results (from the past 720 hour(s))  Urine culture     Status: Abnormal   Collection Time: 09/03/20  9:15 AM   Specimen: Urine, Clean Catch  Result Value Ref Range Status   Specimen Description   Final    URINE, CLEAN CATCH Performed at [facility], [address] [place], [place] [address]    Special Requests   Final    NONE Performed at [facility], [address], [place], [place] [address]    Culture >=100,000 COLONIES/mL ESCHERICHIA COLI (A)  Final   Report Status 09/06/2020 FINAL  Final   Organism ID, Bacteria ESCHERICHIA COLI (A)  Final      Susceptibility   Escherichia coli - MIC*    AMPICILLIN >=32 RESISTANT Resistant     CEFAZOLIN >=64 RESISTANT Resistant     CEFEPIME <=0.12 SENSITIVE Sensitive     CEFTRIAXONE <=0.25 SENSITIVE Sensitive     CIPROFLOXACIN <=0.25 SENSITIVE Sensitive     GENTAMICIN <=1 SENSITIVE Sensitive     IMIPENEM <=0.25 SENSITIVE Sensitive     NITROFURANTOIN <=16 SENSITIVE Sensitive     TRIMETH/SULFA <=20 SENSITIVE Sensitive     AMPICILLIN/SULBACTAM >=32 RESISTANT Resistant     PIP/TAZO >=128 RESISTANT Resistant     * >=100,000 COLONIES/mL ESCHERICHIA COLI  Respiratory Panel by RT PCR (Flu A&B, Covid) - Nasopharyngeal Swab     Status: None   Collection Time: 09/03/20  9:18 AM   Specimen: Nasopharyngeal Swab  Result Value Ref Range Status   SARS Coronavirus 2 by RT PCR NEGATIVE NEGATIVE Final    Comment: (NOTE) SARS-CoV-2 target nucleic acids are NOT DETECTED.  The SARS-CoV-2 RNA is generally detectable in upper respiratoy specimens during the acute phase of infection. The lowest concentration of SARS-CoV-2 viral copies this assay can detect is 131 copies/mL. A  negative result does not preclude SARS-Cov-2 infection and should not be used as the sole basis for treatment or other patient management decisions. A negative result may occur with  improper specimen collection/handling, submission of specimen other than nasopharyngeal swab, presence of viral mutation(s) within the areas targeted by this assay, and inadequate number of viral copies (<131 copies/mL). A negative result must be combined with clinical observations, patient history, and epidemiological information. The expected result is Negative.  Fact Sheet for Patients:  https://www.moore.com/  Fact Sheet for Healthcare Providers:  https://www.young.biz/  This test is no t yet approved or cleared by the [place] FDA and  has been authorized for detection and/or diagnosis of SARS-CoV-2 by FDA under an Emergency Use Authorization (EUA). This EUA will remain  in effect (meaning this test can be used) for the duration of the COVID-19 declaration under Section 564(b)(1) of the Act, 21 U.S.C. section 360bbb-3(b)(1), unless the authorization is terminated or revoked sooner.     Influenza A by PCR NEGATIVE NEGATIVE Final   Influenza B by PCR NEGATIVE NEGATIVE Final    Comment: (NOTE) The Xpert Xpress SARS-CoV-2/FLU/RSV assay is intended as an aid in  the diagnosis of influenza from Nasopharyngeal swab specimens and  should not be used as a sole basis for treatment. Nasal washings and  aspirates are unacceptable for Xpert Xpress SARS-CoV-2/FLU/RSV  testing.  Fact Sheet for Patients: https://www.moore.com/  Fact Sheet for Healthcare Providers: https://www.young.biz/  This test is not yet approved or cleared by the [place] FDA and  has been authorized for detection and/or diagnosis of SARS-CoV-2 by  FDA under an Emergency Use Authorization (EUA). This EUA will remain  in effect (meaning this test can  be used) for the duration of the  Covid-19 declaration under Section 564(b)(1) of the Act, 21  U.S.C. section 360bbb-3(b)(1), unless the authorization is  terminated or revoked. Performed at [facility], [address], [place], [place] [address]     [x]  Treated with Cephalexin, organism resistant to prescribed antimicrobial   New antibiotic prescription: Nitrofurantoin 100 mg twice daily x 5 days   ED Provider: , PA-C    Harlene Salts, PharmD., BCPS, BCCCP Clinical Pharmacist Please refer to [facility] for unit-specific pharmacist    Monday - Friday phone -  [phone] Saturday - Sunday phone - [phone]

## 2020-09-15 ENCOUNTER — Telehealth: Payer: Self-pay | Admitting: Emergency Medicine

## 2020-09-15 NOTE — Telephone Encounter (Signed)
Post ED Visit - Positive Culture Follow-up: Successful Patient Follow-Up  Culture assessed and recommendations reviewed by:  []  , Pharm.D. []  Enzo Bi, Pharm.D., BCPS AQ-ID []  , Pharm.D., BCPS []  Celedonio Miyamoto, .D., BCPS []  [place], .D., BCPS, AAHIVP []  Georgina Pillion, Pharm.D., BCPS, AAHIVP []  [address], PharmD, BCPS []  , PharmD, BCPS []  [place], PharmD, BCPS [x]  [address], PharmD  Positive urine culture  []  Patient discharged without antimicrobial prescription and treatment is now indicated [x]  Organism is resistant to prescribed ED discharge antimicrobial []  Patient with positive blood cultures  Changes discussed with ED provider: PA New antibiotic prescription Nitrofurantoin 100 mg twice daily x five days Called to Estella Husk [phone]  Contacted patient, date 09/15/2020, time 0805   Phillips Climes 09/15/2020, 8:08 AM

## 2020-09-30 ENCOUNTER — Encounter (HOSPITAL_COMMUNITY): Payer: Self-pay

## 2020-09-30 ENCOUNTER — Other Ambulatory Visit: Payer: Self-pay

## 2020-09-30 ENCOUNTER — Emergency Department (HOSPITAL_COMMUNITY)
Admission: EM | Admit: 2020-09-30 | Discharge: 2020-09-30 | Disposition: A | Discharge status: Home / Self Care | Payer: Medicaid Other | Attending: Emergency Medicine | Admitting: Emergency Medicine

## 2020-09-30 DIAGNOSIS — R21 Rash and other nonspecific skin eruption: Secondary | ICD-10-CM | POA: Diagnosis present

## 2020-09-30 DIAGNOSIS — Z5321 Procedure and treatment not carried out due to patient leaving prior to being seen by health care provider: Secondary | ICD-10-CM | POA: Diagnosis not present

## 2020-09-30 NOTE — ED Triage Notes (Addendum)
Pt reports rash on right leg and right for a mont or so. Pt states it has been itching and she is scratching. Pain worse at night. Pt has been using triamcinolone on rash and its not getting better

## 2020-10-27 ENCOUNTER — Telehealth: Payer: Self-pay | Admitting: *Deleted

## 2020-10-27 NOTE — Telephone Encounter (Signed)
Janet Mcguire with [employer] wanted to know if we received provider forms for pt for personal care services she said she could not fax over the documents. She stated to go to [employer] website providers forms to print off. I advised that she would need to fax that. She stated she would try to get one of her coworkers to fax this form over. Let her know that would be best option.

## 2020-11-13 ENCOUNTER — Other Ambulatory Visit: Payer: Self-pay

## 2020-11-13 ENCOUNTER — Emergency Department (HOSPITAL_COMMUNITY)
Admission: EM | Admit: 2020-11-13 | Discharge: 2020-11-13 | Disposition: A | Discharge status: Home / Self Care | Payer: Medicaid Other | Attending: Emergency Medicine | Admitting: Emergency Medicine

## 2020-11-13 ENCOUNTER — Encounter (HOSPITAL_COMMUNITY): Payer: Self-pay | Admitting: Emergency Medicine

## 2020-11-13 DIAGNOSIS — I10 Essential (primary) hypertension: Secondary | ICD-10-CM | POA: Diagnosis not present

## 2020-11-13 DIAGNOSIS — R21 Rash and other nonspecific skin eruption: Secondary | ICD-10-CM | POA: Insufficient documentation

## 2020-11-13 DIAGNOSIS — Z79899 Other long term (current) drug therapy: Secondary | ICD-10-CM | POA: Diagnosis not present

## 2020-11-13 MED ORDER — PREDNISONE 10 MG PO TABS: 40.0000 mg | ORAL_TABLET | Freq: Every day | ORAL | 0 refills | Status: DC

## 2020-11-13 NOTE — ED Provider Notes (Addendum)
Patient currently not in room.  Patient here for rash on right ankle.  With pain and itching.  Nursing thinks she was placed in the room.  We will give her some time to see if she shows back up in the room.   Vanetta Mulders, MD 11/13/20 [phone]   Patient actually was roomed in room 12.  But listed on the triage board his room 11.   Vanetta Mulders, MD 11/13/20 0930

## 2020-11-13 NOTE — Discharge Instructions (Signed)
Follow back up with your dermatologist.  In the meantime try the oral steroids.  Prescription provided.

## 2020-11-13 NOTE — ED Triage Notes (Signed)
Pt c/o of a rash on right ankle x 3 months with pain and itching

## 2020-11-13 NOTE — ED Provider Notes (Signed)
[facility] EMERGENCY DEPARTMENT Provider Note   CSN: [id number] Arrival date & time: 11/13/20  5093     History Chief Complaint  Patient presents with  . Rash    Janet Mcguire is a 60 y.o. female.  Patient with a rash to right foot lower leg since November.  Saw dermatology around that time.  And was treated with a topical steroid.  Patient states it really made no difference.  Patient is in the process of switching primary care providers to [facility] family medicine at [place] [facility].  Having difficulty getting in to see them.  The rash has itching and burning associated with it.  It seems that the steroid ointment was Triamcinolone 0.1%.        Past Medical History:  Diagnosis Date  . Allergy   . Anxiety   . Depression   . Gastroesophageal reflux disease without esophagitis   . Hypertension   . Rectal bleeding 07/08/2018   Added automatically from request for surgery [phone]  . Rectal irritation    chronic  . Substance abuse (HCC)    "years ago"    Patient Active Problem List   Diagnosis Date Noted  . Encounter to establish care 09/05/2020  . Acute cystitis without hematuria 09/05/2020  . Marijuana use 09/05/2020  . Prediabetes 09/05/2020  . Thyroid disease 10/28/2019  . Gastroesophageal reflux disease 10/28/2019  . Hypokalemia 10/09/2018  . Abdominal pain, chronic, epigastric 10/09/2018  . Vitamin D deficiency 09/17/2016  . Literacy level of illiterate 09/16/2016  . Mental impairment 09/16/2016  . Benign essential HTN 01/11/2015  . Depression 01/11/2015  . History of cholecystectomy 01/11/2015    Past Surgical History:  Procedure Laterality Date  . CHOLECYSTECTOMY    . COLONOSCOPY N/A 09/10/2018   Procedure: COLONOSCOPY;  Surgeon: Malissa Hippo, MD;  Location: [facility] ENDO [facility]ITE;  Service: Endoscopy;  Laterality: N/A;  2:00  . ERCP N/A 01/12/2015   Procedure: ENDOSCOPIC RETROGRADE CHOLANGIOPANCREATOGRAPHY (ERCP) ;  Surgeon: Malissa Hippo, MD;   Location: [facility] ORS;  Service: Endoscopy;  Laterality: N/A;  Balloon Extraction  . SPHINCTEROTOMY N/A 01/12/2015   Procedure: SPHINCTEROTOMY;  Surgeon: Malissa Hippo, MD;  Location: [facility] ORS;  Service: Endoscopy;  Laterality: N/A;  . TUBAL LIGATION       OB History    Gravida  3   Para  1   Term  1   Preterm      AB  2   Living  1     SAB  2   IAB  0   Ectopic      Multiple      Live Births  1           Family History  Problem Relation Age of Onset  . Diabetes Mother   . Cancer Brother        throat  . COPD Sister   . Kidney disease Sister   . Diabetes Sister   . Diabetes Sister   . Kidney disease Sister     Social History   Tobacco Use  . Smoking status: Never Smoker  . Smokeless tobacco: Never Used  Vaping Use  . Vaping Use: Never used  Substance Use Topics  . Alcohol use: Not Currently  . Drug use: Yes    Types: Marijuana    Home Medications Prior to Admission medications   Medication Sig Start Date End Date Taking? Authorizing Provider  predniSONE (DELTASONE) 10 MG tablet Take 4 tablets (40  mg total) by mouth daily. 11/13/20  Yes Vanetta Mulders, MD  amLODipine (NORVASC) 10 MG tablet Take 10 mg by mouth daily.  12/01/18   [provider]  cetirizine (ZYRTEC) 10 MG tablet Take 1 tablet by mouth daily. 04/20/20   [provider]  clotrimazole-betamethasone (LOTRISONE) cream Apply topically 2 (two) times daily. 07/03/20   [provider]  diphenhydrAMINE (BENADRYL) 25 MG tablet Take 1 tablet (25 mg total) by mouth every 6 (six) hours as needed for itching or allergies. 12/29/18   Vassie Loll, MD  FLUoxetine (PROZAC) 20 MG capsule Take 20 mg by mouth daily.    [provider]  meloxicam (MOBIC) 15 MG tablet Take 1 tablet by mouth daily. 03/21/20   [provider]  metoprolol tartrate (LOPRESSOR) 25 MG tablet Take 1 tablet (25 mg total) by mouth 2 (two) times daily. 02/10/19   Antoine Poche, MD  mupirocin  ointment (BACTROBAN) 2 % Apply topically. 08/11/20   [provider]  nystatin-triamcinolone ointment (MYCOLOG) Apply 1 application topically 2 (two) times daily. 07/07/20   Raeford Razor, MD  ondansetron (ZOFRAN ODT) 8 MG disintegrating tablet Take 1 tablet (8 mg total) by mouth every 8 (eight) hours as needed for nausea or vomiting. 09/03/20   Walisiewicz, Kaitlyn E, PA-C  pantoprazole (PROTONIX) 20 MG tablet Take 1 tablet (20 mg total) by mouth daily. 09/05/20 01/03/21  Anabel Halon, MD    Allergies    Ibuprofen  Review of Systems   Review of Systems  Constitutional: Negative for chills and fever.  HENT: Negative for rhinorrhea and sore throat.   Eyes: Negative for visual disturbance.  Respiratory: Negative for cough and shortness of breath.   Cardiovascular: Negative for chest pain and leg swelling.  Gastrointestinal: Negative for abdominal pain, diarrhea, nausea and vomiting.  Genitourinary: Negative for dysuria.  Musculoskeletal: Negative for back pain and neck pain.  Skin: Positive for rash.  Neurological: Negative for dizziness, light-headedness and headaches.  Hematological: Does not bruise/bleed easily.  Psychiatric/Behavioral: Negative for confusion.    Physical Exam Updated Vital Signs BP (!) 134/106   Pulse 62   Temp 98.3 F (36.8 C) (Oral)   Resp 18   Ht 1.854 m (6\' 1" )   Wt 114.8 kg   LMP  (LMP Unknown) Comment: last  one years ago   SpO2 100%   BMI 33.38 kg/m   Physical Exam Vitals and nursing note reviewed.  Constitutional:      General: She is not in acute distress.    Appearance: Normal appearance. She is well-developed and well-nourished.  HENT:     Head: Normocephalic and atraumatic.  Eyes:     Extraocular Movements: Extraocular movements intact.     Conjunctiva/sclera: Conjunctivae normal.     Pupils: Pupils are equal, round, and reactive to light.  Cardiovascular:     Rate and Rhythm: Normal rate and regular rhythm.     Heart  sounds: No murmur heard.   Pulmonary:     Effort: Pulmonary effort is normal. No respiratory distress.     Breath sounds: Normal breath sounds.  Abdominal:     Palpations: Abdomen is soft.     Tenderness: There is no abdominal tenderness.  Musculoskeletal:        General: No edema. Normal range of motion.     Cervical back: Normal range of motion and neck supple.     Comments: Patient with kind of a papular rash to the right lower extremity lower  half of the leg and foot.  With some scaling.  Some erythema.  No signs of any secondary infection.  Cap refill is intact.  Does not seem to follow any dermatome pattern.  Skin:    General: Skin is warm and dry.     Capillary Refill: Capillary refill takes less than 2 seconds.  Neurological:     General: No focal deficit present.     Mental Status: She is alert and oriented to person, place, and time.     Cranial Nerves: No cranial nerve deficit.     Sensory: No sensory deficit.     Motor: No weakness.  Psychiatric:        Mood and Affect: Mood and affect normal.     ED Results / Procedures / Treatments   Labs (all labs ordered are listed, but only abnormal results are displayed) Labs Reviewed - No data to display  EKG None  Radiology No results found.  Procedures Procedures (including critical care time)  Medications Ordered in ED Medications - No data to display  ED Course  I have reviewed the triage vital signs and the nursing notes.  Pertinent labs & imaging results that were available during my care of the patient were reviewed by me and considered in my medical decision making (see chart for details).    MDM Rules/Calculators/A&P                          Patient states she has transportation difficulties in the meantime we will try oral prednisone.  And have her follow back up with dermatology.  Patient nontoxic no acute distress.  The cause of the dermatitis is not clear.    Final Clinical Impression(s) / ED  Diagnoses Final diagnoses:  Rash    Rx / DC Orders ED Discharge Orders         Ordered    predniSONE (DELTASONE) 10 MG tablet  Daily        11/13/20 0931           Vanetta Mulders, MD 11/13/20 [phone]

## 2020-12-03 ENCOUNTER — Emergency Department (HOSPITAL_COMMUNITY)
Admission: EM | Admit: 2020-12-03 | Discharge: 2020-12-03 | Disposition: A | Discharge status: Home / Self Care | Payer: Medicaid Other | Attending: Emergency Medicine | Admitting: Emergency Medicine

## 2020-12-03 ENCOUNTER — Encounter (HOSPITAL_COMMUNITY): Payer: Self-pay | Admitting: Emergency Medicine

## 2020-12-03 ENCOUNTER — Other Ambulatory Visit: Payer: Self-pay

## 2020-12-03 DIAGNOSIS — L298 Other pruritus: Secondary | ICD-10-CM | POA: Diagnosis not present

## 2020-12-03 DIAGNOSIS — I10 Essential (primary) hypertension: Secondary | ICD-10-CM | POA: Diagnosis not present

## 2020-12-03 DIAGNOSIS — R7303 Prediabetes: Secondary | ICD-10-CM | POA: Diagnosis not present

## 2020-12-03 DIAGNOSIS — R21 Rash and other nonspecific skin eruption: Secondary | ICD-10-CM | POA: Diagnosis present

## 2020-12-03 DIAGNOSIS — Z79899 Other long term (current) drug therapy: Secondary | ICD-10-CM | POA: Insufficient documentation

## 2020-12-03 MED ORDER — DIPHENHYDRAMINE HCL 25 MG PO CAPS
25.0000 mg | ORAL_CAPSULE | Freq: Once | ORAL | Status: AC
  Administered 2020-12-03: 25 mg via ORAL
  Filled 2020-12-03: qty 1

## 2020-12-03 MED ORDER — PREDNISONE 50 MG PO TABS
60.0000 mg | ORAL_TABLET | Freq: Once | ORAL | Status: AC
  Administered 2020-12-03: 60 mg via ORAL
  Filled 2020-12-03: qty 1

## 2020-12-03 MED ORDER — PREDNISONE 50 MG PO TABS: 50.0000 mg | ORAL_TABLET | Freq: Every day | ORAL | 0 refills | Status: AC

## 2020-12-03 MED ORDER — CETIRIZINE HCL 10 MG PO TABS: 10.0000 mg | ORAL_TABLET | Freq: Every day | ORAL | 1 refills | Status: DC

## 2020-12-03 NOTE — Discharge Instructions (Addendum)
Please take your medications, as directed.  Follow-up with your primary care provider regarding today's encounter and for ongoing evaluation.  I would also like for you to reach out to your dermatologist to reschedule an appointment.  You would benefit from continued evaluation for your dermatitis.    Continue to monitor for any obvious precipitating triggers or offending agents.  For topical relief, he can apply moisturizing creams or 1% over-the-counter hydrocortisone cream.  Please take the medications that I prescribed, as directed.  Do not combine cetirizine with other antihistamines.  Please take the prednisone in the morning as it can be activating.  Return to the ED or seek immediate medical attention should you experience any new or worsening symptoms.

## 2020-12-03 NOTE — ED Triage Notes (Signed)
Pt c/o rash to bilateral arms and back that began 3 days ago.  Pt states that is itches and burns.

## 2020-12-03 NOTE — ED Provider Notes (Signed)
[facility] EMERGENCY DEPARTMENT Provider Note   CSN: [id number] Arrival date & time: 12/03/20  1055     History Chief Complaint  Patient presents with  . Rash    Janet Mcguire is a 60 y.o. female who was evaluated on November 13, 2020 for a 34-month history of right foot rash who was discharged home with a prednisone taper and advised to follow-up with her dermatologist return to the ED with itchy rash involving bilateral arms and back.  On my examination, patient has papular rash involving her arms, chest, abdomen, and back.  They are small, 0.25 cm papules that are erythematous with overlying excoriations.  They appear to be in follicular distribution.  She states that they are significantly pruritic and now they are a "burning" painful.  She was concerned about herpes zoster.  She denies any obvious precipitating exposures.  She does admit that she is a [place] and recently went to a particularly dirty home.  She is unsure as played a role.  She denies any illicit drug use.  She also denies any other new medications, fevers or chills, or other systemic symptoms.  Denies concern for disseminated sexually-transmitted infection.  Of note, she went to her new primary care provider in [facility], [place] shortly after her most recent ED encounter who told her to discontinue the prescribed steroid Dosepak given that it can cause palpitations.  She states that she was not complaining of symptomatic palpitations from her recently prescribed steroids and instead states that they had been managing her pruritus well.    HPI     Past Medical History:  Diagnosis Date  . Allergy   . Anxiety   . Depression   . Gastroesophageal reflux disease without esophagitis   . Hypertension   . Rectal bleeding 07/08/2018   Added automatically from request for surgery [phone]  . Rectal irritation    chronic  . Substance abuse (HCC)    "years ago"    Patient Active Problem List   Diagnosis Date Noted  .  Encounter to establish care 09/05/2020  . Acute cystitis without hematuria 09/05/2020  . Marijuana use 09/05/2020  . Prediabetes 09/05/2020  . Thyroid disease 10/28/2019  . Gastroesophageal reflux disease 10/28/2019  . Hypokalemia 10/09/2018  . Abdominal pain, chronic, epigastric 10/09/2018  . Vitamin D deficiency 09/17/2016  . Literacy level of illiterate 09/16/2016  . Mental impairment 09/16/2016  . Benign essential HTN 01/11/2015  . Depression 01/11/2015  . History of cholecystectomy 01/11/2015    Past Surgical History:  Procedure Laterality Date  . CHOLECYSTECTOMY    . COLONOSCOPY N/A 09/10/2018   Procedure: COLONOSCOPY;  Surgeon: Malissa Hippo, MD;  Location: [facility];  Service: Endoscopy;  Laterality: N/A;  2:00  . ERCP N/A 01/12/2015   Procedure: ENDOSCOPIC RETROGRADE CHOLANGIOPANCREATOGRAPHY (ERCP) ;  Surgeon: Malissa Hippo, MD;  Location: [facility] ORS;  Service: Endoscopy;  Laterality: N/A;  Balloon Extraction  . SPHINCTEROTOMY N/A 01/12/2015   Procedure: SPHINCTEROTOMY;  Surgeon: Malissa Hippo, MD;  Location: [facility] ORS;  Service: Endoscopy;  Laterality: N/A;  . TUBAL LIGATION       OB History    Gravida  3   Para  1   Term  1   Preterm      AB  2   Living  1     SAB  2   IAB  0   Ectopic      Multiple      Live Births  1           Family History  Problem Relation Age of Onset  . Diabetes Mother   . Cancer Brother        throat  . COPD Sister   . Kidney disease Sister   . Diabetes Sister   . Diabetes Sister   . Kidney disease Sister     Social History   Tobacco Use  . Smoking status: Never Smoker  . Smokeless tobacco: Never Used  Vaping Use  . Vaping Use: Never used  Substance Use Topics  . Alcohol use: Not Currently  . Drug use: Yes    Types: Marijuana    Home Medications Prior to Admission medications   Medication Sig Start Date End Date Taking? Authorizing Provider  predniSONE (DELTASONE) 50 MG tablet Take 1 tablet (50  mg total) by mouth daily with breakfast for 5 days. 12/03/20 12/08/20 Yes Lorelee New, PA-C  amLODipine (NORVASC) 10 MG tablet Take 10 mg by mouth daily.  12/01/18   [provider]  cetirizine (ZYRTEC) 10 MG tablet Take 1 tablet (10 mg total) by mouth daily. 12/03/20   Lorelee New, PA-C  clotrimazole-betamethasone (LOTRISONE) cream Apply topically 2 (two) times daily. 07/03/20   [provider]  diphenhydrAMINE (BENADRYL) 25 MG tablet Take 1 tablet (25 mg total) by mouth every 6 (six) hours as needed for itching or allergies. 12/29/18   Vassie Loll, MD  FLUoxetine (PROZAC) 20 MG capsule Take 20 mg by mouth daily.    [provider]  meloxicam (MOBIC) 15 MG tablet Take 1 tablet by mouth daily. 03/21/20   [provider]  metoprolol tartrate (LOPRESSOR) 25 MG tablet Take 1 tablet (25 mg total) by mouth 2 (two) times daily. 02/10/19   Antoine Poche, MD  mupirocin ointment (BACTROBAN) 2 % Apply topically. 08/11/20   [provider]  nystatin-triamcinolone ointment (MYCOLOG) Apply 1 application topically 2 (two) times daily. 07/07/20   Raeford Razor, MD  ondansetron (ZOFRAN ODT) 8 MG disintegrating tablet Take 1 tablet (8 mg total) by mouth every 8 (eight) hours as needed for nausea or vomiting. 09/03/20   Walisiewicz, Kaitlyn E, PA-C  pantoprazole (PROTONIX) 20 MG tablet Take 1 tablet (20 mg total) by mouth daily. 09/05/20 01/03/21  Anabel Halon, MD    Allergies    Ibuprofen and Tylenol [acetaminophen]  Review of Systems   Review of Systems  All other systems reviewed and are negative.   Physical Exam Updated Vital Signs BP 140/81 (BP Location: Right Wrist)   Pulse (!) 51   Temp 98.9 F (37.2 C) (Oral)   Resp 16   Ht 6\' 1"  (1.854 m)   Wt 112.5 kg   LMP  (LMP Unknown) Comment: last  one years ago   SpO2 100%   BMI 32.72 kg/m   Physical Exam Vitals and nursing note reviewed. Exam conducted with a chaperone present.   Constitutional:      Appearance: Normal appearance.  HENT:     Head: Normocephalic and atraumatic.  Eyes:     General: No scleral icterus.    Conjunctiva/sclera: Conjunctivae normal.     Comments: No scleral icterus.  Cardiovascular:     Rate and Rhythm: Normal rate.     Pulses: Normal pulses.  Pulmonary:     Effort: Pulmonary effort is normal. No respiratory distress.  Musculoskeletal:        General: Normal range of motion.  Skin:    General:  Skin is dry.     Coloration: Skin is not jaundiced.     Findings: Rash present.     Comments: Diffuse 0.25 cm papular rash on erythematous base involving torso and arms.  Spares the face, mouth, and palms of hands.  Does not appear to involve lower extremities.  Significant excoriations noted.  Does not appear to be in follicular distribution.  No purulence or tenderness to palpation.  Neurological:     Mental Status: She is alert.     GCS: GCS eye subscore is 4. GCS verbal subscore is 5. GCS motor subscore is 6.  Psychiatric:        Mood and Affect: Mood normal.        Behavior: Behavior normal.        Thought Content: Thought content normal.     ED Results / Procedures / Treatments   Labs (all labs ordered are listed, but only abnormal results are displayed) Labs Reviewed - No data to display  EKG None  Radiology No results found.  Procedures Procedures   Medications Ordered in ED Medications - No data to display  ED Course  I have reviewed the triage vital signs and the nursing notes.  Pertinent labs & imaging results that were available during my care of the patient were reviewed by me and considered in my medical decision making (see chart for details).    MDM Rules/Calculators/A&P                          Glorious Peachina Gravette was evaluated in Emergency Department on 12/03/2020 for the symptoms described in the history of present illness. She was evaluated in the context of the global COVID-19 pandemic, which necessitated  consideration that the patient might be at risk for infection with the SARS-CoV-2 virus that causes COVID-19. Institutional protocols and algorithms that pertain to the evaluation of patients at risk for COVID-19 are in a state of rapid change based on information released by regulatory bodies including the CDC and federal and state organizations. These policies and algorithms were followed during the patient's care in the ED.  I personally reviewed patient's medical chart and all notes from triage and staff during today's encounter. I have also ordered and reviewed all labs and imaging that I felt to be medically necessary in the evaluation of this patient's complaints and with consideration of their physical exam. If needed, translation services were available and utilized.   Patient with diffuse papular rash involving torso and arms.  Complains of significant pruritus and her "burning" discomfort is secondary to her excoriations.  No vesicular appearance concerning for shingles.  Also with bilateral/diffuse distribution.  No areas of golden crusting or erythema otherwise concerning for superimposed bacterial infection.  She denies any fevers or symptoms of systemic illness.  Negative Nikolsky sign.  No new medications.  She is well-appearing.  She understands the need to follow-up with her dermatologist for ongoing evaluation and management.  Given the diffuse extent of her rash, feel as though topical steroid cream is inadequate.  Recommending skin moisturizing cream instead.  We will represcribe prednisone burst and antihistamines and if her primary care provider would like her to discontinue, they can provide her with new treatment regimen.  She tells me that the prednisone was well-tolerated and working well initially before it was stopped prematurely.  Low suspicion for fungal infection.  Low suspicion for disseminated STI.  She denies any shortness of breath, nausea,  or other symptoms concerning for  allergic reaction.  Do not appear to be consistent with hives.  Encouraging her to try to make note of possible offending agents or triggers.  No laboratory work-up warranted.    ED return precautions discussed.  Patient voices understanding and is agreeable to the plan.  Final Clinical Impression(s) / ED Diagnoses Final diagnoses:  Rash    Rx / DC Orders ED Discharge Orders         Ordered    cetirizine (ZYRTEC) 10 MG tablet  Daily        12/03/20 1224    predniSONE (DELTASONE) 50 MG tablet  Daily with breakfast        12/03/20 1224           Elvera Maria 12/03/20 1233    Mancel Bale, MD 12/04/20 [phone]

## 2021-01-03 ENCOUNTER — Ambulatory Visit: Payer: Medicaid Other | Admitting: Internal Medicine

## 2021-03-07 DIAGNOSIS — J4520 Mild intermittent asthma, uncomplicated: Secondary | ICD-10-CM | POA: Insufficient documentation

## 2021-06-18 ENCOUNTER — Other Ambulatory Visit (HOSPITAL_COMMUNITY): Payer: Self-pay | Admitting: Internal Medicine

## 2021-06-18 ENCOUNTER — Other Ambulatory Visit (HOSPITAL_COMMUNITY): Payer: Self-pay | Admitting: Physician Assistant

## 2021-06-18 DIAGNOSIS — Z1231 Encounter for screening mammogram for malignant neoplasm of breast: Secondary | ICD-10-CM

## 2021-07-20 ENCOUNTER — Ambulatory Visit (HOSPITAL_COMMUNITY): Payer: Medicaid Other

## 2021-09-14 ENCOUNTER — Ambulatory Visit (HOSPITAL_COMMUNITY): Payer: Medicaid Other

## 2021-09-20 ENCOUNTER — Ambulatory Visit (HOSPITAL_COMMUNITY): Payer: Medicaid Other

## 2021-09-24 ENCOUNTER — Ambulatory Visit (HOSPITAL_COMMUNITY)
Admission: RE | Admit: 2021-09-24 | Discharge: 2021-09-24 | Disposition: A | Discharge status: Home / Self Care | Payer: Medicaid Other | Source: Ambulatory Visit | Attending: Physician Assistant | Admitting: Physician Assistant

## 2021-09-24 DIAGNOSIS — Z1231 Encounter for screening mammogram for malignant neoplasm of breast: Secondary | ICD-10-CM | POA: Diagnosis present

## 2021-09-25 ENCOUNTER — Other Ambulatory Visit (HOSPITAL_COMMUNITY): Payer: Self-pay | Admitting: Physician Assistant

## 2021-09-26 ENCOUNTER — Emergency Department (HOSPITAL_COMMUNITY)
Admission: EM | Admit: 2021-09-26 | Discharge: 2021-09-26 | Disposition: A | Discharge status: Home / Self Care | Payer: Medicaid Other | Attending: Emergency Medicine | Admitting: Emergency Medicine

## 2021-09-26 ENCOUNTER — Other Ambulatory Visit: Payer: Self-pay

## 2021-09-26 ENCOUNTER — Encounter (HOSPITAL_COMMUNITY): Payer: Self-pay

## 2021-09-26 DIAGNOSIS — U071 COVID-19: Secondary | ICD-10-CM | POA: Insufficient documentation

## 2021-09-26 DIAGNOSIS — I10 Essential (primary) hypertension: Secondary | ICD-10-CM | POA: Diagnosis not present

## 2021-09-26 DIAGNOSIS — R319 Hematuria, unspecified: Secondary | ICD-10-CM | POA: Insufficient documentation

## 2021-09-26 DIAGNOSIS — R0789 Other chest pain: Secondary | ICD-10-CM | POA: Diagnosis not present

## 2021-09-26 DIAGNOSIS — N390 Urinary tract infection, site not specified: Secondary | ICD-10-CM

## 2021-09-26 DIAGNOSIS — Z79899 Other long term (current) drug therapy: Secondary | ICD-10-CM | POA: Insufficient documentation

## 2021-09-26 DIAGNOSIS — R531 Weakness: Secondary | ICD-10-CM | POA: Diagnosis present

## 2021-09-26 LAB — TROPONIN I (HIGH SENSITIVITY): Troponin I (High Sensitivity): 4 ng/L (ref <18)

## 2021-09-26 LAB — CBC WITH DIFFERENTIAL/PLATELET
Abs Immature Granulocytes: 0.05 10*3/uL (ref 0.00–0.07)
Basophils Absolute: 0.1 10*3/uL (ref 0.0–0.1)
Basophils Relative: 1 %
Eosinophils Absolute: 0.2 10*3/uL (ref 0.0–0.5)
Eosinophils Relative: 3 %
HCT: 41.1 % (ref 36.0–46.0)
Hemoglobin: 12.5 g/dL (ref 12.0–15.0)
Immature Granulocytes: 1 %
Lymphocytes Relative: 33 %
Lymphs Abs: 2.6 10*3/uL (ref 0.7–4.0)
MCH: 28.6 pg (ref 26.0–34.0)
MCHC: 30.4 g/dL (ref 30.0–36.0)
MCV: 94.1 fL (ref 80.0–100.0)
Monocytes Absolute: 0.7 10*3/uL (ref 0.1–1.0)
Monocytes Relative: 8 %
Neutro Abs: 4.2 10*3/uL (ref 1.7–7.7)
Neutrophils Relative %: 54 %
Platelets: 148 10*3/uL — ABNORMAL LOW (ref 150–400)
RBC: 4.37 MIL/uL (ref 3.87–5.11)
RDW: 16.1 % — ABNORMAL HIGH (ref 11.5–15.5)
WBC: 7.8 10*3/uL (ref 4.0–10.5)
nRBC: 0 % (ref 0.0–0.2)

## 2021-09-26 LAB — RESP PANEL BY RT-PCR (FLU A&B, COVID) ARPGX2
Influenza A by PCR: NEGATIVE
Influenza B by PCR: NEGATIVE
SARS Coronavirus 2 by RT PCR: POSITIVE — AB

## 2021-09-26 LAB — URINALYSIS, ROUTINE W REFLEX MICROSCOPIC
Bilirubin Urine: NEGATIVE
Glucose, UA: NEGATIVE mg/dL
Ketones, ur: NEGATIVE mg/dL
Leukocytes,Ua: NEGATIVE
Nitrite: POSITIVE — AB
Protein, ur: 100 mg/dL — AB
Specific Gravity, Urine: >1.03 — ABNORMAL HIGH (ref 1.005–1.030)
pH: 6 (ref 5.0–8.0)

## 2021-09-26 LAB — URINALYSIS, MICROSCOPIC (REFLEX): WBC, UA: NONE SEEN WBC/hpf (ref 0–5)

## 2021-09-26 LAB — BASIC METABOLIC PANEL
Anion gap: 10 (ref 5–15)
BUN: 18 mg/dL (ref 6–20)
CO2: 23 mmol/L (ref 22–32)
Calcium: 8.8 mg/dL — ABNORMAL LOW (ref 8.9–10.3)
Chloride: 108 mmol/L (ref 98–111)
Creatinine, Ser: 0.77 mg/dL (ref 0.44–1.00)
GFR, Estimated: >60 mL/min (ref >60)
Glucose, Bld: 106 mg/dL — ABNORMAL HIGH (ref 70–99)
Potassium: 3.8 mmol/L (ref 3.5–5.1)
Sodium: 141 mmol/L (ref 135–145)

## 2021-09-26 MED ORDER — CEPHALEXIN 500 MG PO CAPS: 500.0000 mg | ORAL_CAPSULE | Freq: Three times a day (TID) | ORAL | 0 refills | Status: AC

## 2021-09-26 NOTE — ED Provider Notes (Signed)
[facility] EMERGENCY DEPARTMENT Provider Note   CSN: [id number] Arrival date & time: 09/26/21  [id number]     History Chief Complaint  Patient presents with   Mass    Chest    Janet Mcguire is a 60 y.o. female.  Patient presents to ER chief complaint of "bump on her chest."  She states that she is noticed a bump on the right upper chest for several months.  She points to her right clavicular head and states that it sticks out more than the left side.  Denies any tenderness there is no fever no cough no vomiting no diarrhea.  Also complaining of generalized malaise and generalized weakness today.  Denies fevers or cough denies headache denies chest pain denies abdominal pain denies dysuria.      Past Medical History:  Diagnosis Date   Allergy    Anxiety    Depression    Gastroesophageal reflux disease without esophagitis    Hypertension    Rectal bleeding 07/08/2018   Added automatically from request for surgery [id number]   Rectal irritation    chronic   Substance abuse ([place])    "years ago"    Patient Active Problem List   Diagnosis Date Noted   Encounter to establish care 09/05/2020   Acute cystitis without hematuria 09/05/2020   Marijuana use 09/05/2020   Prediabetes 09/05/2020   Thyroid disease 10/28/2019   Gastroesophageal reflux disease 10/28/2019   Hypokalemia 10/09/2018   Abdominal pain, chronic, epigastric 10/09/2018   Vitamin D deficiency 09/17/2016   Literacy level of illiterate 09/16/2016   Mental impairment 09/16/2016   Benign essential HTN 01/11/2015   Depression 01/11/2015   History of cholecystectomy 01/11/2015    Past Surgical History:  Procedure Laterality Date   CHOLECYSTECTOMY     COLONOSCOPY N/A 09/10/2018   Procedure: COLONOSCOPY;  Surgeon: Rogene Houston, MD;  Location: [facility];  Service: Endoscopy;  Laterality: N/A;  2:00   ERCP N/A 01/12/2015   Procedure: ENDOSCOPIC RETROGRADE CHOLANGIOPANCREATOGRAPHY (ERCP) ;  Surgeon: Rogene Houston,  MD;  Location: [facility];  Service: Endoscopy;  Laterality: N/A;  Balloon Extraction   SPHINCTEROTOMY N/A 01/12/2015   Procedure: SPHINCTEROTOMY;  Surgeon: Rogene Houston, MD;  Location: [facility];  Service: Endoscopy;  Laterality: N/A;   TUBAL LIGATION       OB History     Gravida  3   Para  1   Term  1   Preterm      AB  2   Living  1      SAB  2   IAB  0   Ectopic      Multiple      Live Births  1           Family History  Problem Relation Age of Onset   Diabetes Mother    Cancer Brother        throat   COPD Sister    Kidney disease Sister    Diabetes Sister    Diabetes Sister    Kidney disease Sister     Social History   Tobacco Use   Smoking status: Never   Smokeless tobacco: Never  Vaping Use   Vaping Use: Never used  Substance Use Topics   Alcohol use: Not Currently   Drug use: Yes    Types: Marijuana    Home Medications Prior to Admission medications   Medication Sig Start Date End Date Taking? Authorizing Provider  cephALEXin [facility])  500 MG capsule Take 1 capsule (500 mg total) by mouth 3 (three) times daily for 5 days. 09/26/21 10/01/21 Yes Luna Fuse, MD  amLODipine (NORVASC) 10 MG tablet Take 10 mg by mouth daily.  12/01/18   [provider]  cetirizine (ZYRTEC) 10 MG tablet Take 1 tablet (10 mg total) by mouth daily. 12/03/20   Corena Herter, PA-C  clotrimazole-betamethasone (LOTRISONE) cream Apply topically 2 (two) times daily. 07/03/20   [provider]  diphenhydrAMINE (BENADRYL) 25 MG tablet Take 1 tablet (25 mg total) by mouth every 6 (six) hours as needed for itching or allergies. 12/29/18   Barton Dubois, MD  FLUoxetine (PROZAC) 20 MG capsule Take 20 mg by mouth daily.    [provider]  meloxicam (MOBIC) 15 MG tablet Take 1 tablet by mouth daily. 03/21/20   [provider]  metoprolol tartrate (LOPRESSOR) 25 MG tablet Take 1 tablet (25 mg total) by mouth 2 (two) times daily. 02/10/19   Arnoldo Lenis, MD  mupirocin ointment (BACTROBAN) 2 % Apply topically. 08/11/20   [provider]  nystatin-triamcinolone ointment (MYCOLOG) Apply 1 application topically 2 (two) times daily. 07/07/20   Virgel Manifold, MD  ondansetron (ZOFRAN ODT) 8 MG disintegrating tablet Take 1 tablet (8 mg total) by mouth every 8 (eight) hours as needed for nausea or vomiting. 09/03/20   Walisiewicz, Kaitlyn E, PA-C  pantoprazole (PROTONIX) 20 MG tablet Take 1 tablet (20 mg total) by mouth daily. 09/05/20 01/03/21  Lindell Spar, MD    Allergies    Ibuprofen and Tylenol [acetaminophen]  Review of Systems   Review of Systems  Constitutional:  Negative for fever.  HENT:  Negative for ear pain.   Eyes:  Negative for pain.  Respiratory:  Negative for cough.   Cardiovascular:  Negative for chest pain.  Gastrointestinal:  Negative for abdominal pain.  Genitourinary:  Negative for flank pain.  Musculoskeletal:  Negative for back pain.  Skin:  Negative for rash.  Neurological:  Negative for headaches.   Physical Exam Updated Vital Signs BP (!) 119/57   Pulse 71   Temp 98.9 F (37.2 C) (Oral)   Resp 13   Ht 6\' 1"  (1.854 m)   Wt 112.5 kg   LMP  (LMP Unknown) Comment: last  one years ago   SpO2 99%   BMI 32.72 kg/m   Physical Exam Constitutional:      General: She is not in acute distress.    Appearance: Normal appearance.  HENT:     Head: Normocephalic.     Nose: Nose normal.  Eyes:     Extraocular Movements: Extraocular movements intact.  Cardiovascular:     Rate and Rhythm: Normal rate.  Pulmonary:     Effort: Pulmonary effort is normal.  Musculoskeletal:        General: Normal range of motion.     Cervical back: Normal range of motion.     Comments: No abnormal chest wall lesion noted.  Patient points to her right clavicular head as what concerns are that is more protruding than the left.  But no tenderness no erythema no fluctuance no abnormal lesion noted.  Neurological:      General: No focal deficit present.     Mental Status: She is alert. Mental status is at baseline.    ED Results / Procedures / Treatments   Labs (all labs ordered are listed, but only abnormal results are displayed) Labs Reviewed  CBC WITH DIFFERENTIAL/PLATELET -  Abnormal; Notable for the following components:      Result Value   RDW 16.1 (*)    Platelets 148 (*)    All other components within normal limits  BASIC METABOLIC PANEL - Abnormal; Notable for the following components:   Glucose, Bld 106 (*)    Calcium 8.8 (*)    All other components within normal limits  URINALYSIS, ROUTINE W REFLEX MICROSCOPIC - Abnormal; Notable for the following components:   Specific Gravity, Urine >1.030 (*)    Hgb urine dipstick MODERATE (*)    Protein, ur 100 (*)    Nitrite POSITIVE (*)    All other components within normal limits  URINALYSIS, MICROSCOPIC (REFLEX) - Abnormal; Notable for the following components:   Bacteria, UA MANY (*)    All other components within normal limits  RESP PANEL BY RT-PCR (FLU A&B, COVID) [id number]  TROPONIN I (HIGH SENSITIVITY)    EKG EKG Interpretation  Date/Time:  Wednesday September 26 2021 08:21:43 EST Ventricular Rate:  82 PR Interval:  161 QRS Duration: 106 QT Interval:  372 QTC Calculation: 435 R Axis:   -37 Text Interpretation: Sinus rhythm Left axis deviation RSR' in V1 or V2, probably normal variant Confirmed by Thamas Jaegers ([id number]) on 09/26/2021 8:50:13 AM  Radiology MM 3D SCREEN BREAST BILATERAL  Result Date: 09/24/2021 CLINICAL DATA:  Screening. EXAM: DIGITAL SCREENING BILATERAL MAMMOGRAM WITH TOMOSYNTHESIS AND CAD TECHNIQUE: Bilateral screening digital craniocaudal and mediolateral oblique mammograms were obtained. Bilateral screening digital breast tomosynthesis was performed. The images were evaluated with computer-aided detection. COMPARISON:  Previous exam(s). ACR Breast Density Category b: There are scattered areas of fibroglandular density.  FINDINGS: In the right breast, a possible mass in the superior aspect of the breast in the oblique projection warrants further evaluation. In the left breast, a possible asymmetry in the central aspect of the breast in the craniocaudal projection warrants further evaluation. IMPRESSION: Further evaluation is suggested for a possible mass in the right breast and possible asymmetry in the left breast. RECOMMENDATION: Diagnostic mammogram and possibly ultrasound of both breasts. (Code:[id number]) The patient will be contacted regarding the findings, and additional imaging will be scheduled. BI-RADS CATEGORY  0: Incomplete. Need additional imaging evaluation and/or prior mammograms for comparison. Electronically Signed   By: Claudie Revering M.D.   On: 09/24/2021 17:39    Procedures Procedures   Medications Ordered in ED Medications - No data to display  ED Course  I have reviewed the triage vital signs and the nursing notes.  Pertinent labs & imaging results that were available during my care of the patient were reviewed by me and considered in my medical decision making (see chart for details).    MDM Rules/Calculators/A&P                           Broad work-up and generalized labs are sent due to her complaint of fatigue.  Further urinary tract infection.  Given a prescription of Keflex, advised outpatient follow-up with her doctor within the week.  Advised return if she has fevers worsening symptoms or any additional concerns.  Final Clinical Impression(s) / ED Diagnoses Final diagnoses:  Urinary tract infection with hematuria, site unspecified    Rx / DC Orders ED Discharge Orders          Ordered    cephALEXin (KEFLEX) 500 MG capsule  3 times daily        09/26/21 0851  Cheryll Cockayne, MD 09/26/21 [phone]

## 2021-09-26 NOTE — ED Triage Notes (Signed)
Patient complaining of mass on chest that has been there for several months. Also, began having weakness upon arrival.

## 2021-09-26 NOTE — Discharge Instructions (Addendum)
Your tests showed a urinary tract infection. Call your primary care doctor or specialist as discussed in the next 2-3 days.   Return immediately back to the ER if:  Your symptoms worsen within the next 12-24 hours. You develop new symptoms such as new fevers, persistent vomiting, new pain, shortness of breath, or new weakness or numbness, or if you have any other concerns.

## 2021-10-01 ENCOUNTER — Other Ambulatory Visit (HOSPITAL_COMMUNITY): Payer: Self-pay | Admitting: Physician Assistant

## 2021-10-01 DIAGNOSIS — R928 Other abnormal and inconclusive findings on diagnostic imaging of breast: Secondary | ICD-10-CM

## 2021-10-16 ENCOUNTER — Other Ambulatory Visit: Payer: Self-pay

## 2021-10-16 ENCOUNTER — Ambulatory Visit (HOSPITAL_COMMUNITY)
Admission: RE | Admit: 2021-10-16 | Discharge: 2021-10-16 | Disposition: A | Discharge status: Home / Self Care | Payer: Medicaid Other | Source: Ambulatory Visit | Attending: Physician Assistant | Admitting: Physician Assistant

## 2021-10-16 DIAGNOSIS — R928 Other abnormal and inconclusive findings on diagnostic imaging of breast: Secondary | ICD-10-CM | POA: Insufficient documentation

## 2021-10-16 DIAGNOSIS — R922 Inconclusive mammogram: Secondary | ICD-10-CM | POA: Diagnosis not present

## 2021-12-18 ENCOUNTER — Emergency Department (HOSPITAL_COMMUNITY)
Admission: EM | Admit: 2021-12-18 | Discharge: 2021-12-18 | Disposition: A | Discharge status: Home / Self Care | Payer: Medicaid Other | Attending: Emergency Medicine | Admitting: Emergency Medicine

## 2021-12-18 ENCOUNTER — Encounter (HOSPITAL_COMMUNITY): Payer: Self-pay

## 2021-12-18 ENCOUNTER — Other Ambulatory Visit: Payer: Self-pay

## 2021-12-18 DIAGNOSIS — M62830 Muscle spasm of back: Secondary | ICD-10-CM | POA: Insufficient documentation

## 2021-12-18 DIAGNOSIS — M62838 Other muscle spasm: Secondary | ICD-10-CM | POA: Diagnosis not present

## 2021-12-18 DIAGNOSIS — M542 Cervicalgia: Secondary | ICD-10-CM | POA: Diagnosis present

## 2021-12-18 MED ORDER — LIDOCAINE 5 % EX PTCH
1.0000 | MEDICATED_PATCH | CUTANEOUS | Status: DC
  Administered 2021-12-18: 1 via TRANSDERMAL
  Filled 2021-12-18: qty 1

## 2021-12-18 MED ORDER — METHOCARBAMOL 500 MG PO TABS: 500.0000 mg | ORAL_TABLET | Freq: Two times a day (BID) | ORAL | 0 refills | Status: DC

## 2021-12-18 MED ORDER — LIDOCAINE 4 % EX PTCH: 1.0000 | MEDICATED_PATCH | Freq: Two times a day (BID) | CUTANEOUS | 0 refills | Status: DC

## 2021-12-18 MED ORDER — METHOCARBAMOL 500 MG PO TABS
500.0000 mg | ORAL_TABLET | Freq: Once | ORAL | Status: AC
Start: 2021-12-18 — End: 2021-12-18
  Administered 2021-12-18: 500 mg via ORAL
  Filled 2021-12-18: qty 1

## 2021-12-18 NOTE — ED Provider Notes (Signed)
[facility] EMERGENCY DEPARTMENT Provider Note   CSN: [id number] Arrival date & time: 12/18/21  0747     History  Chief Complaint  Patient presents with   Neck Pain    Janet Mcguire is a 61 y.o. female.  HPI     61 year old female comes in with chief complaint of neck pain.  Patient's neck pain has been present for about a week now.  She denies any specific evoking factor.  Reports that she woke up with the neck pain, and the symptoms have not improved despite taking over-the-counter medication.  She denies any associated numbness, tingling, headaches, vision change, dizziness, lightheadedness.  Denies any recent falls or trauma.  Denies any new medications.   Home Medications Prior to Admission medications   Medication Sig Start Date End Date Taking? Authorizing Provider  Lidocaine 4 % PTCH Apply 1 patch topically 2 (two) times daily. 12/18/21  Yes Derwood Kaplan, MD  methocarbamol (ROBAXIN) 500 MG tablet Take 1 tablet (500 mg total) by mouth 2 (two) times daily. 12/18/21  Yes Steel Kerney, MD  amLODipine (NORVASC) 10 MG tablet Take 10 mg by mouth daily.  12/01/18   [provider]  cetirizine (ZYRTEC) 10 MG tablet Take 1 tablet (10 mg total) by mouth daily. 12/03/20   Lorelee New, PA-C  clotrimazole-betamethasone (LOTRISONE) cream Apply topically 2 (two) times daily. 07/03/20   [provider]  diphenhydrAMINE (BENADRYL) 25 MG tablet Take 1 tablet (25 mg total) by mouth every 6 (six) hours as needed for itching or allergies. 12/29/18   Vassie Loll, MD  doxycycline (VIBRAMYCIN) 100 MG capsule Take 1 capsule (100 mg total) by mouth 2 (two) times daily. 12/21/21   Benjiman Core, MD  FLUoxetine (PROZAC) 20 MG capsule Take 20 mg by mouth daily.    [provider]  meloxicam (MOBIC) 15 MG tablet Take 1 tablet by mouth daily. 03/21/20   [provider]  metoprolol tartrate (LOPRESSOR) 25 MG tablet Take 1 tablet (25 mg total) by mouth 2 (two)  times daily. 02/10/19   Antoine Poche, MD  mupirocin ointment (BACTROBAN) 2 % Apply topically. 08/11/20   [provider]  nystatin-triamcinolone ointment (MYCOLOG) Apply 1 application topically 2 (two) times daily. 07/07/20   Raeford Razor, MD  ondansetron (ZOFRAN ODT) 8 MG disintegrating tablet Take 1 tablet (8 mg total) by mouth every 8 (eight) hours as needed for nausea or vomiting. 09/03/20   Walisiewicz, Yvonna Alanis E, PA-C  oxyCODONE (ROXICODONE) 5 MG immediate release tablet Take 1 tablet (5 mg total) by mouth every 6 (six) hours as needed for severe pain. 12/21/21   Benjiman Core, MD  pantoprazole (PROTONIX) 20 MG tablet Take 1 tablet (20 mg total) by mouth daily. 09/05/20 01/03/21  Anabel Halon, MD  valACYclovir (VALTREX) 1000 MG tablet Take 1 tablet (1,000 mg total) by mouth 3 (three) times daily. 12/21/21   Benjiman Core, MD      Allergies    Ibuprofen and Tylenol [acetaminophen]    Review of Systems   Review of Systems  All other systems reviewed and are negative.  Physical Exam Updated Vital Signs BP 140/80 (BP Location: Right Arm)    Pulse 61    Temp 98.7 F (37.1 C) (Oral)    Resp 20    Ht 6\' 1"  (1.854 m)    Wt 112.5 kg    LMP  (LMP Unknown) Comment: last  one years ago    SpO2 100%    BMI 32.72  kg/m  Physical Exam Vitals and nursing note reviewed.  Constitutional:      Appearance: She is well-developed.  HENT:     Head: Atraumatic.  Eyes:     Extraocular Movements: Extraocular movements intact.     Pupils: Pupils are equal, round, and reactive to light.  Neck:     Comments: L paraspinal and scalene muscle tenderness to palpation Cardiovascular:     Rate and Rhythm: Normal rate.  Pulmonary:     Effort: Pulmonary effort is normal.  Musculoskeletal:     Cervical back: Normal range of motion and neck supple. Tenderness present. No rigidity.  Skin:    General: Skin is warm and dry.  Neurological:     Mental Status: She is alert and oriented to  person, place, and time.     Cranial Nerves: No cranial nerve deficit.     Sensory: No sensory deficit.     Coordination: Coordination normal.    ED Results / Procedures / Treatments   Labs (all labs ordered are listed, but only abnormal results are displayed) Labs Reviewed - No data to display  EKG None  Radiology No results found.  Procedures Procedures    Medications Ordered in ED Medications  methocarbamol (ROBAXIN) tablet 500 mg (500 mg Oral Given 12/18/21 1601)    ED Course/ Medical Decision Making/ A&P                           Medical Decision Making Risk OTC drugs. Prescription drug management.   This patient presents to the ED with chief complaint(s) of neck pain with pertinent past medical history NEGATIVE FOR trauma, stroke.  The differential diagnosis includes acute cervical spasm, cervical strain. Patient has no midline C-spine tenderness and has no meningismus -therefore clinical suspicion for meningitis is quite low.  Basic labs not indicated. with no C-spine tenderness, suspicion for pathologic fractures is quite low, especially in the setting of no trauma.  CT C-spine also not indicated.   The initial plan is to give patient some Robaxin.  We will also place lidocaine patch.  I have discussed with the patient some exercises that she can perform and warm compresses that she can apply to her neck. We have advised her that she follows up with PCP for further evaluation and perhaps she might need physical therapy.  With no clinical concerns for meningitis, dissection - advanced CT or LP is not indicated.  Final Clinical Impression(s) / ED Diagnoses Final diagnoses:  Muscle spasms of neck    Rx / DC Orders ED Discharge Orders          Ordered    Lidocaine 4 % PTCH  2 times daily        12/18/21 1043    methocarbamol (ROBAXIN) 500 MG tablet  2 times daily        12/18/21 1043              Derwood Kaplan, MD 12/23/21 1354

## 2021-12-18 NOTE — Discharge Instructions (Signed)
Take the medicine prescribed. Complete the exercises recommended on discharge instructions. See your primary doctor in 1 week if the symptoms continue. Return to the ER if you start having dizziness, lightheadedness, severe neck pain, headache, vision change.

## 2021-12-18 NOTE — ED Triage Notes (Signed)
Patient complaining of neck pain without injury for a week.

## 2021-12-21 ENCOUNTER — Emergency Department (HOSPITAL_COMMUNITY): Payer: Medicaid Other

## 2021-12-21 ENCOUNTER — Encounter (HOSPITAL_COMMUNITY): Payer: Self-pay | Admitting: *Deleted

## 2021-12-21 ENCOUNTER — Emergency Department (HOSPITAL_COMMUNITY)
Admission: EM | Admit: 2021-12-21 | Discharge: 2021-12-21 | Disposition: A | Discharge status: Home / Self Care | Payer: Medicaid Other | Attending: Emergency Medicine | Admitting: Emergency Medicine

## 2021-12-21 ENCOUNTER — Other Ambulatory Visit: Payer: Self-pay

## 2021-12-21 DIAGNOSIS — B029 Zoster without complications: Secondary | ICD-10-CM | POA: Diagnosis not present

## 2021-12-21 DIAGNOSIS — L03221 Cellulitis of neck: Secondary | ICD-10-CM | POA: Diagnosis not present

## 2021-12-21 DIAGNOSIS — R600 Localized edema: Secondary | ICD-10-CM | POA: Diagnosis not present

## 2021-12-21 DIAGNOSIS — M7989 Other specified soft tissue disorders: Secondary | ICD-10-CM | POA: Diagnosis not present

## 2021-12-21 DIAGNOSIS — M542 Cervicalgia: Secondary | ICD-10-CM | POA: Diagnosis present

## 2021-12-21 LAB — BASIC METABOLIC PANEL
Anion gap: 9 (ref 5–15)
BUN: 16 mg/dL (ref 6–20)
CO2: 23 mmol/L (ref 22–32)
Calcium: 9.2 mg/dL (ref 8.9–10.3)
Chloride: 108 mmol/L (ref 98–111)
Creatinine, Ser: 0.95 mg/dL (ref 0.44–1.00)
GFR, Estimated: >60 mL/min (ref >60)
Glucose, Bld: 131 mg/dL — ABNORMAL HIGH (ref 70–99)
Potassium: 3.1 mmol/L — ABNORMAL LOW (ref 3.5–5.1)
Sodium: 140 mmol/L (ref 135–145)

## 2021-12-21 LAB — CBC WITH DIFFERENTIAL/PLATELET
Abs Immature Granulocytes: 0.04 10*3/uL (ref 0.00–0.07)
Basophils Absolute: 0.1 10*3/uL (ref 0.0–0.1)
Basophils Relative: 1 %
Eosinophils Absolute: 0.5 10*3/uL (ref 0.0–0.5)
Eosinophils Relative: 6 %
HCT: 43.1 % (ref 36.0–46.0)
Hemoglobin: 13.7 g/dL (ref 12.0–15.0)
Immature Granulocytes: 1 %
Lymphocytes Relative: 32 %
Lymphs Abs: 2.7 10*3/uL (ref 0.7–4.0)
MCH: 29.1 pg (ref 26.0–34.0)
MCHC: 31.8 g/dL (ref 30.0–36.0)
MCV: 91.5 fL (ref 80.0–100.0)
Monocytes Absolute: 0.8 10*3/uL (ref 0.1–1.0)
Monocytes Relative: 9 %
Neutro Abs: 4.3 10*3/uL (ref 1.7–7.7)
Neutrophils Relative %: 51 %
Platelets: 262 10*3/uL (ref 150–400)
RBC: 4.71 MIL/uL (ref 3.87–5.11)
RDW: 15.4 % (ref 11.5–15.5)
WBC: 8.4 10*3/uL (ref 4.0–10.5)
nRBC: 0 % (ref 0.0–0.2)

## 2021-12-21 MED ORDER — IOHEXOL 300 MG/ML  SOLN
100.0000 mL | Freq: Once | INTRAMUSCULAR | Status: AC | PRN
  Administered 2021-12-21: 75 mL via INTRAVENOUS

## 2021-12-21 MED ORDER — SODIUM CHLORIDE 0.9 % IV BOLUS
500.0000 mL | Freq: Once | INTRAVENOUS | Status: AC
  Administered 2021-12-21: 500 mL via INTRAVENOUS

## 2021-12-21 MED ORDER — DOXYCYCLINE HYCLATE 100 MG PO CAPS: 100.0000 mg | ORAL_CAPSULE | Freq: Two times a day (BID) | ORAL | 0 refills | Status: DC

## 2021-12-21 MED ORDER — VALACYCLOVIR HCL 1 G PO TABS: 1000.0000 mg | ORAL_TABLET | Freq: Three times a day (TID) | ORAL | 0 refills | Status: DC

## 2021-12-21 MED ORDER — OXYCODONE HCL 5 MG PO TABS
5.0000 mg | ORAL_TABLET | Freq: Four times a day (QID) | ORAL | 0 refills | Status: DC | PRN
Start: 2021-12-21 — End: 2024-01-23

## 2021-12-21 NOTE — ED Notes (Signed)
Patient Alert and oriented to baseline. Stable and ambulatory to baseline. Patient verbalized understanding of the discharge instructions.  Patient belongings were taken by the patient.   

## 2021-12-21 NOTE — ED Notes (Signed)
Rash noted to L side of neck/clavicle area. Pt denies pain, reports "tightness and itching".  ?

## 2021-12-21 NOTE — ED Notes (Signed)
Pt placed on cardiac monitor, HR slowing to the 120's.  ?
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
## 2021-12-21 NOTE — ED Triage Notes (Signed)
Pt c/o left sided neck pain, itching, redness x 1.5 weeks. Pt also c/o nausea. Denies vomiting.  ?

## 2021-12-21 NOTE — Discharge Instructions (Addendum)
Follow up with your doctor.  Return for worsening symptoms.  

## 2022-01-03 DIAGNOSIS — K219 Gastro-esophageal reflux disease without esophagitis: Secondary | ICD-10-CM | POA: Diagnosis not present

## 2022-01-03 DIAGNOSIS — R7989 Other specified abnormal findings of blood chemistry: Secondary | ICD-10-CM | POA: Diagnosis not present

## 2022-01-03 DIAGNOSIS — I10 Essential (primary) hypertension: Secondary | ICD-10-CM | POA: Diagnosis not present

## 2022-01-03 DIAGNOSIS — B029 Zoster without complications: Secondary | ICD-10-CM | POA: Diagnosis not present

## 2022-02-05 ENCOUNTER — Other Ambulatory Visit: Payer: Self-pay

## 2022-02-05 ENCOUNTER — Emergency Department (HOSPITAL_COMMUNITY)
Admission: EM | Admit: 2022-02-05 | Discharge: 2022-02-05 | Disposition: A | Discharge status: Home / Self Care | Payer: Medicaid Other | Attending: Emergency Medicine | Admitting: Emergency Medicine

## 2022-02-05 ENCOUNTER — Encounter (HOSPITAL_COMMUNITY): Payer: Self-pay | Admitting: Emergency Medicine

## 2022-02-05 DIAGNOSIS — B029 Zoster without complications: Secondary | ICD-10-CM | POA: Diagnosis not present

## 2022-02-05 DIAGNOSIS — L299 Pruritus, unspecified: Secondary | ICD-10-CM | POA: Insufficient documentation

## 2022-02-05 DIAGNOSIS — R21 Rash and other nonspecific skin eruption: Secondary | ICD-10-CM | POA: Diagnosis present

## 2022-02-05 DIAGNOSIS — Z8619 Personal history of other infectious and parasitic diseases: Secondary | ICD-10-CM

## 2022-02-05 MED ORDER — TRIAMCINOLONE ACETONIDE 0.1 % EX CREA: 1.0000 "application " | TOPICAL_CREAM | Freq: Two times a day (BID) | CUTANEOUS | 0 refills | Status: DC

## 2022-02-05 MED ORDER — DIPHENHYDRAMINE HCL 25 MG PO TABS: 25.0000 mg | ORAL_TABLET | Freq: Four times a day (QID) | ORAL | 0 refills | Status: DC | PRN

## 2022-02-05 NOTE — ED Triage Notes (Signed)
Pt c/o rash to breasts for while.  ?

## 2022-02-05 NOTE — Discharge Instructions (Signed)
Apply the cream to the affected area as directed.  You may also take the Benadryl prescription as directed if needed for itching.  Avoid scratching the area.  Follow-up with your primary care provider or dermatologist if needed. ?

## 2022-02-05 NOTE — ED Provider Notes (Signed)
?[place] EMERGENCY DEPARTMENT ?Provider Note ? ? ?CSN: [id number] ?Arrival date & time: 02/05/22  6237 ? ?  ? ?History ? ?Chief Complaint  ?Patient presents with  ? Rash  ? ? ?Janet Mcguire is a 61 y.o. female. ? ? ?Rash ?Associated symptoms: no fever, no joint pain and no shortness of breath   ? ?  ? ?Janet Mcguire is a 61 y.o. female who presents to the Emergency Department complaining of itching rash between her breasts.  She states that she was treated approximately 1 month ago for zoster.  She is completed course of antibiotics and Valtrex.  She states the rash is healing, but continues to itch.  She states she has been scratching in her sleep.  She describes the rash as painful but the pain worsens the more she scratches.  She denies any drainage or swelling.  No redness fever or chills. ? ? ?Home Medications ?Prior to Admission medications   ?Medication Sig Start Date End Date Taking? Authorizing Provider  ?amLODipine (NORVASC) 10 MG tablet Take 10 mg by mouth daily.  12/01/18   [provider]  ?cetirizine (ZYRTEC) 10 MG tablet Take 1 tablet (10 mg total) by mouth daily. 12/03/20   Lorelee New, PA-C  ?clotrimazole-betamethasone (LOTRISONE) cream Apply topically 2 (two) times daily. 07/03/20   [provider]  ?diphenhydrAMINE (BENADRYL) 25 MG tablet Take 1 tablet (25 mg total) by mouth every 6 (six) hours as needed for itching or allergies. 12/29/18   Vassie Loll, MD  ?doxycycline (VIBRAMYCIN) 100 MG capsule Take 1 capsule (100 mg total) by mouth 2 (two) times daily. 12/21/21   Benjiman Core, MD  ?FLUoxetine (PROZAC) 20 MG capsule Take 20 mg by mouth daily.    [provider]  ?Lidocaine 4 % PTCH Apply 1 patch topically 2 (two) times daily. 12/18/21   Derwood Kaplan, MD  ?meloxicam (MOBIC) 15 MG tablet Take 1 tablet by mouth daily. 03/21/20   [provider]  ?methocarbamol (ROBAXIN) 500 MG tablet Take 1 tablet (500 mg total) by mouth 2 (two) times daily. 12/18/21    Derwood Kaplan, MD  ?metoprolol tartrate (LOPRESSOR) 25 MG tablet Take 1 tablet (25 mg total) by mouth 2 (two) times daily. 02/10/19   Antoine Poche, MD  ?mupirocin ointment (BACTROBAN) 2 % Apply topically. 08/11/20   [provider]  ?nystatin-triamcinolone ointment (MYCOLOG) Apply 1 application topically 2 (two) times daily. 07/07/20   Raeford Razor, MD  ?ondansetron (ZOFRAN ODT) 8 MG disintegrating tablet Take 1 tablet (8 mg total) by mouth every 8 (eight) hours as needed for nausea or vomiting. 09/03/20   Walisiewicz, Caroleen Hamman, PA-C  ?oxyCODONE (ROXICODONE) 5 MG immediate release tablet Take 1 tablet (5 mg total) by mouth every 6 (six) hours as needed for severe pain. 12/21/21   Benjiman Core, MD  ?pantoprazole (PROTONIX) 20 MG tablet Take 1 tablet (20 mg total) by mouth daily. 09/05/20 01/03/21  Anabel Halon, MD  ?valACYclovir (VALTREX) 1000 MG tablet Take 1 tablet (1,000 mg total) by mouth 3 (three) times daily. 12/21/21   Benjiman Core, MD  ?   ? ?Allergies    ?Ibuprofen and Tylenol [acetaminophen]   ? ?Review of Systems   ?Review of Systems  ?Constitutional:  Negative for chills and fever.  ?Respiratory:  Negative for cough and shortness of breath.   ?Musculoskeletal:  Negative for arthralgias.  ?Skin:  Positive for rash. Negative for color change.  ?Neurological:  Negative for weakness and numbness.  ?  All other systems reviewed and are negative. ? ?Physical Exam ?Updated Vital Signs ?BP 135/80   Pulse 72   Temp 98.1 ?F (36.7 ?C) (Oral)   Resp 18   Ht 6\' 1"  (1.854 m)   Wt 109.3 kg   LMP  (LMP Unknown) Comment: last  one years ago   SpO2 100%   BMI 31.80 kg/m?  ?Physical Exam ?Vitals and nursing note reviewed.  ?Constitutional:   ?   General: She is not in acute distress. ?   Appearance: Normal appearance.  ?HENT:  ?   Head: Atraumatic.  ?Cardiovascular:  ?   Rate and Rhythm: Normal rate and regular rhythm.  ?   Pulses: Normal pulses.  ?Pulmonary:  ?   Effort: Pulmonary effort is  normal.  ?   Breath sounds: Normal breath sounds.  ?Chest:  ?   Chest wall: No tenderness.  ?Musculoskeletal:     ?   General: Normal range of motion.  ?   Cervical back: Normal range of motion. No tenderness.  ?Skin: ?   General: Skin is warm.  ?   Capillary Refill: Capillary refill takes less than 2 seconds.  ?   Findings: Rash present.  ?   Comments: Several areas of excoriation between the breasts.  There is some crusted lesions as well.  No edema, drainage or surrounding erythema.  ?Neurological:  ?   General: No focal deficit present.  ?   Mental Status: She is alert.  ?   Sensory: No sensory deficit.  ?   Motor: No weakness.  ? ? ?ED Results / Procedures / Treatments   ?Labs ?(all labs ordered are listed, but only abnormal results are displayed) ?Labs Reviewed - No data to display ? ?EKG ?None ? ?Radiology ?No results found. ? ?Procedures ?Procedures  ? ? ?Medications Ordered in ED ?Medications - No data to display ? ?ED Course/ Medical Decision Making/ A&P ?  ?                        ?Medical Decision Making ? ?Patient here for itching rash between her breasts.  Treated 1 month ago for zoster.  Completed course of antibiotics and Valtrex.  Here today for persistent itching to the area.  There is some excoriations noted along with some crusted lesions. ? ?She is well-appearing, nontoxic.  No concerning symptoms for abscess.  Patient is no longer having significant pain from the area.  Doubt that this is post herpetic neuralgia.  Vital signs are reassuring.  Patient appears appropriate for discharge home.  All questions were answered. ? ? ? ? ? ? ? ? ?Final Clinical Impression(s) / ED Diagnoses ?Final diagnoses:  ?Pruritus  ?H/O herpes zoster  ? ? ?Rx / DC Orders ?ED Discharge Orders   ? ? None  ? ?  ? ? ?  ? , PA-C ?02/05/22 02/07/22 ? ?  ?[id number], MD ?02/06/22 0935 ? ?

## 2022-03-11 ENCOUNTER — Other Ambulatory Visit (HOSPITAL_COMMUNITY): Payer: Self-pay | Admitting: Physician Assistant

## 2022-03-11 DIAGNOSIS — N6310 Unspecified lump in the right breast, unspecified quadrant: Secondary | ICD-10-CM

## 2022-04-03 DIAGNOSIS — F331 Major depressive disorder, recurrent, moderate: Secondary | ICD-10-CM | POA: Diagnosis not present

## 2022-04-03 DIAGNOSIS — K219 Gastro-esophageal reflux disease without esophagitis: Secondary | ICD-10-CM | POA: Diagnosis not present

## 2022-04-03 DIAGNOSIS — I10 Essential (primary) hypertension: Secondary | ICD-10-CM | POA: Diagnosis not present

## 2022-04-15 ENCOUNTER — Other Ambulatory Visit (HOSPITAL_COMMUNITY): Payer: Self-pay | Admitting: Physician Assistant

## 2022-04-25 ENCOUNTER — Ambulatory Visit (HOSPITAL_COMMUNITY): Payer: Medicaid Other

## 2022-04-25 ENCOUNTER — Inpatient Hospital Stay (HOSPITAL_COMMUNITY): Admission: RE | Admit: 2022-04-25 | Payer: Medicaid Other | Source: Ambulatory Visit

## 2022-05-03 ENCOUNTER — Emergency Department (HOSPITAL_COMMUNITY)
Admission: EM | Admit: 2022-05-03 | Discharge: 2022-05-03 | Discharge status: Against Medical Advice | Payer: Medicaid Other | Attending: Emergency Medicine | Admitting: Emergency Medicine

## 2022-05-03 DIAGNOSIS — Z5321 Procedure and treatment not carried out due to patient leaving prior to being seen by health care provider: Secondary | ICD-10-CM | POA: Insufficient documentation

## 2022-05-03 DIAGNOSIS — N898 Other specified noninflammatory disorders of vagina: Secondary | ICD-10-CM | POA: Insufficient documentation

## 2022-05-03 DIAGNOSIS — M545 Low back pain, unspecified: Secondary | ICD-10-CM | POA: Insufficient documentation

## 2022-05-03 NOTE — ED Triage Notes (Signed)
Pt c/o left lower back pain that radiates around to her abd that started this afternoon. Pt also c/o vaginal itching.

## 2022-05-14 ENCOUNTER — Encounter (HOSPITAL_COMMUNITY): Payer: Self-pay

## 2022-05-14 ENCOUNTER — Inpatient Hospital Stay (HOSPITAL_COMMUNITY): Admission: RE | Admit: 2022-05-14 | Payer: Medicaid Other | Source: Ambulatory Visit

## 2022-05-14 ENCOUNTER — Ambulatory Visit (HOSPITAL_COMMUNITY): Admission: RE | Admit: 2022-05-14 | Payer: Medicaid Other | Source: Ambulatory Visit

## 2022-05-29 ENCOUNTER — Ambulatory Visit: Payer: Medicaid Other | Admitting: Family Medicine

## 2022-06-10 ENCOUNTER — Ambulatory Visit: Payer: Medicaid Other | Admitting: Family Medicine

## 2022-08-09 DIAGNOSIS — Z23 Encounter for immunization: Secondary | ICD-10-CM | POA: Diagnosis not present

## 2022-08-22 DIAGNOSIS — M2550 Pain in unspecified joint: Secondary | ICD-10-CM | POA: Diagnosis not present

## 2022-08-22 DIAGNOSIS — R69 Illness, unspecified: Secondary | ICD-10-CM | POA: Diagnosis not present

## 2022-08-22 DIAGNOSIS — Z0000 Encounter for general adult medical examination without abnormal findings: Secondary | ICD-10-CM | POA: Diagnosis not present

## 2022-08-22 DIAGNOSIS — Z1231 Encounter for screening mammogram for malignant neoplasm of breast: Secondary | ICD-10-CM | POA: Diagnosis not present

## 2022-08-22 DIAGNOSIS — R7303 Prediabetes: Secondary | ICD-10-CM | POA: Diagnosis not present

## 2022-08-22 DIAGNOSIS — I10 Essential (primary) hypertension: Secondary | ICD-10-CM | POA: Diagnosis not present

## 2022-09-10 ENCOUNTER — Other Ambulatory Visit (HOSPITAL_COMMUNITY): Payer: Self-pay | Admitting: Physician Assistant

## 2022-09-10 DIAGNOSIS — N6310 Unspecified lump in the right breast, unspecified quadrant: Secondary | ICD-10-CM

## 2022-10-01 ENCOUNTER — Ambulatory Visit (HOSPITAL_COMMUNITY)
Admission: RE | Admit: 2022-10-01 | Discharge: 2022-10-01 | Disposition: A | Discharge status: Home / Self Care | Payer: 59 | Source: Ambulatory Visit | Attending: Physician Assistant | Admitting: Physician Assistant

## 2022-10-01 DIAGNOSIS — R92333 Mammographic heterogeneous density, bilateral breasts: Secondary | ICD-10-CM | POA: Diagnosis not present

## 2022-10-01 DIAGNOSIS — N6311 Unspecified lump in the right breast, upper outer quadrant: Secondary | ICD-10-CM | POA: Diagnosis not present

## 2022-10-01 DIAGNOSIS — N6001 Solitary cyst of right breast: Secondary | ICD-10-CM | POA: Diagnosis not present

## 2022-10-01 DIAGNOSIS — N6310 Unspecified lump in the right breast, unspecified quadrant: Secondary | ICD-10-CM

## 2022-10-01 DIAGNOSIS — Z1239 Encounter for other screening for malignant neoplasm of breast: Secondary | ICD-10-CM | POA: Diagnosis not present

## 2022-12-24 DIAGNOSIS — R058 Other specified cough: Secondary | ICD-10-CM | POA: Diagnosis not present

## 2022-12-24 DIAGNOSIS — J4520 Mild intermittent asthma, uncomplicated: Secondary | ICD-10-CM | POA: Diagnosis not present

## 2022-12-24 DIAGNOSIS — I10 Essential (primary) hypertension: Secondary | ICD-10-CM | POA: Diagnosis not present

## 2022-12-24 DIAGNOSIS — K219 Gastro-esophageal reflux disease without esophagitis: Secondary | ICD-10-CM | POA: Diagnosis not present

## 2023-04-23 DIAGNOSIS — F325 Major depressive disorder, single episode, in full remission: Secondary | ICD-10-CM | POA: Diagnosis not present

## 2023-04-23 DIAGNOSIS — K219 Gastro-esophageal reflux disease without esophagitis: Secondary | ICD-10-CM | POA: Diagnosis not present

## 2023-04-23 DIAGNOSIS — F419 Anxiety disorder, unspecified: Secondary | ICD-10-CM | POA: Diagnosis not present

## 2023-04-23 DIAGNOSIS — J45909 Unspecified asthma, uncomplicated: Secondary | ICD-10-CM | POA: Diagnosis not present

## 2023-04-23 DIAGNOSIS — E669 Obesity, unspecified: Secondary | ICD-10-CM | POA: Diagnosis not present

## 2023-04-23 DIAGNOSIS — M1990 Unspecified osteoarthritis, unspecified site: Secondary | ICD-10-CM | POA: Diagnosis not present

## 2023-04-23 DIAGNOSIS — Z008 Encounter for other general examination: Secondary | ICD-10-CM | POA: Diagnosis not present

## 2023-04-23 DIAGNOSIS — Z6831 Body mass index (BMI) 31.0-31.9, adult: Secondary | ICD-10-CM | POA: Diagnosis not present

## 2023-04-23 DIAGNOSIS — I10 Essential (primary) hypertension: Secondary | ICD-10-CM | POA: Diagnosis not present

## 2023-08-26 ENCOUNTER — Encounter (HOSPITAL_COMMUNITY): Payer: Self-pay | Admitting: Physician Assistant

## 2023-08-28 DIAGNOSIS — Z131 Encounter for screening for diabetes mellitus: Secondary | ICD-10-CM | POA: Diagnosis not present

## 2023-08-28 DIAGNOSIS — Z1231 Encounter for screening mammogram for malignant neoplasm of breast: Secondary | ICD-10-CM | POA: Diagnosis not present

## 2023-08-28 DIAGNOSIS — Z1159 Encounter for screening for other viral diseases: Secondary | ICD-10-CM | POA: Diagnosis not present

## 2023-08-28 DIAGNOSIS — K219 Gastro-esophageal reflux disease without esophagitis: Secondary | ICD-10-CM | POA: Diagnosis not present

## 2023-08-28 DIAGNOSIS — I10 Essential (primary) hypertension: Secondary | ICD-10-CM | POA: Diagnosis not present

## 2023-08-28 DIAGNOSIS — Z13220 Encounter for screening for lipoid disorders: Secondary | ICD-10-CM | POA: Diagnosis not present

## 2023-08-28 DIAGNOSIS — Z0000 Encounter for general adult medical examination without abnormal findings: Secondary | ICD-10-CM | POA: Diagnosis not present

## 2023-08-28 DIAGNOSIS — R110 Nausea: Secondary | ICD-10-CM | POA: Diagnosis not present

## 2023-08-28 DIAGNOSIS — R1084 Generalized abdominal pain: Secondary | ICD-10-CM | POA: Diagnosis not present

## 2023-09-09 ENCOUNTER — Encounter (HOSPITAL_COMMUNITY): Payer: Self-pay | Admitting: Physician Assistant

## 2023-09-10 ENCOUNTER — Other Ambulatory Visit (HOSPITAL_COMMUNITY): Payer: Self-pay | Admitting: Physician Assistant

## 2023-09-10 DIAGNOSIS — N63 Unspecified lump in unspecified breast: Secondary | ICD-10-CM

## 2023-10-21 ENCOUNTER — Ambulatory Visit (HOSPITAL_COMMUNITY): Payer: Medicaid Other

## 2023-10-21 ENCOUNTER — Inpatient Hospital Stay (HOSPITAL_COMMUNITY): Admission: RE | Admit: 2023-10-21 | Payer: 59 | Source: Ambulatory Visit

## 2023-10-30 ENCOUNTER — Ambulatory Visit (HOSPITAL_COMMUNITY)
Admission: RE | Admit: 2023-10-30 | Discharge: 2023-10-30 | Disposition: A | Discharge status: Home / Self Care | Payer: Medicaid Other | Source: Ambulatory Visit | Attending: Physician Assistant | Admitting: Physician Assistant

## 2023-10-30 DIAGNOSIS — N6311 Unspecified lump in the right breast, upper outer quadrant: Secondary | ICD-10-CM | POA: Diagnosis present

## 2023-10-30 DIAGNOSIS — N63 Unspecified lump in unspecified breast: Secondary | ICD-10-CM | POA: Diagnosis present

## 2023-10-30 DIAGNOSIS — N6001 Solitary cyst of right breast: Secondary | ICD-10-CM | POA: Diagnosis not present

## 2023-10-30 DIAGNOSIS — R928 Other abnormal and inconclusive findings on diagnostic imaging of breast: Secondary | ICD-10-CM | POA: Diagnosis not present

## 2023-10-30 DIAGNOSIS — R92333 Mammographic heterogeneous density, bilateral breasts: Secondary | ICD-10-CM | POA: Diagnosis not present

## 2023-11-26 ENCOUNTER — Emergency Department (HOSPITAL_COMMUNITY): Payer: 59

## 2023-11-26 ENCOUNTER — Other Ambulatory Visit: Payer: Self-pay

## 2023-11-26 ENCOUNTER — Emergency Department (HOSPITAL_COMMUNITY)
Admission: EM | Admit: 2023-11-26 | Discharge: 2023-11-26 | Disposition: A | Discharge status: Home / Self Care | Payer: 59 | Attending: Emergency Medicine | Admitting: Emergency Medicine

## 2023-11-26 ENCOUNTER — Encounter (HOSPITAL_COMMUNITY): Payer: Self-pay

## 2023-11-26 DIAGNOSIS — E876 Hypokalemia: Secondary | ICD-10-CM | POA: Insufficient documentation

## 2023-11-26 DIAGNOSIS — Z20822 Contact with and (suspected) exposure to covid-19: Secondary | ICD-10-CM | POA: Insufficient documentation

## 2023-11-26 DIAGNOSIS — R059 Cough, unspecified: Secondary | ICD-10-CM | POA: Diagnosis not present

## 2023-11-26 DIAGNOSIS — Z79899 Other long term (current) drug therapy: Secondary | ICD-10-CM | POA: Insufficient documentation

## 2023-11-26 DIAGNOSIS — J101 Influenza due to other identified influenza virus with other respiratory manifestations: Secondary | ICD-10-CM | POA: Insufficient documentation

## 2023-11-26 DIAGNOSIS — I10 Essential (primary) hypertension: Secondary | ICD-10-CM | POA: Diagnosis not present

## 2023-11-26 DIAGNOSIS — I700 Atherosclerosis of aorta: Secondary | ICD-10-CM | POA: Diagnosis not present

## 2023-11-26 DIAGNOSIS — R6883 Chills (without fever): Secondary | ICD-10-CM | POA: Diagnosis not present

## 2023-11-26 LAB — URINALYSIS, W/ REFLEX TO CULTURE (INFECTION SUSPECTED)
Bilirubin Urine: NEGATIVE
Glucose, UA: 50 mg/dL — AB
Ketones, ur: 20 mg/dL — AB
Leukocytes,Ua: NEGATIVE
Nitrite: NEGATIVE
Protein, ur: >=300 mg/dL — AB
Specific Gravity, Urine: 1.024 (ref 1.005–1.030)
pH: 5 (ref 5.0–8.0)

## 2023-11-26 LAB — COMPREHENSIVE METABOLIC PANEL
ALT: 22 U/L (ref 0–44)
AST: 26 U/L (ref 15–41)
Albumin: 4.1 g/dL (ref 3.5–5.0)
Alkaline Phosphatase: 74 U/L (ref 38–126)
Anion gap: 12 (ref 5–15)
BUN: 7 mg/dL (ref 6–20)
CO2: 22 mmol/L (ref 22–32)
Calcium: 9 mg/dL (ref 8.9–10.3)
Chloride: 105 mmol/L (ref 98–111)
Creatinine, Ser: 0.95 mg/dL (ref 0.44–1.00)
GFR, Estimated: >60 mL/min (ref >60)
Glucose, Bld: 146 mg/dL — ABNORMAL HIGH (ref 70–99)
Potassium: 3 mmol/L — ABNORMAL LOW (ref 3.5–5.1)
Sodium: 139 mmol/L (ref 135–145)
Total Bilirubin: 0.7 mg/dL (ref 0.0–1.2)
Total Protein: 8.2 g/dL — ABNORMAL HIGH (ref 6.5–8.1)

## 2023-11-26 LAB — CBC WITH DIFFERENTIAL/PLATELET
Abs Immature Granulocytes: 0.03 10*3/uL (ref 0.00–0.07)
Basophils Absolute: 0.1 10*3/uL (ref 0.0–0.1)
Basophils Relative: 1 %
Eosinophils Absolute: 0 10*3/uL (ref 0.0–0.5)
Eosinophils Relative: 0 %
HCT: 45.4 % (ref 36.0–46.0)
Hemoglobin: 14.9 g/dL (ref 12.0–15.0)
Immature Granulocytes: 1 %
Lymphocytes Relative: 18 %
Lymphs Abs: 1.1 10*3/uL (ref 0.7–4.0)
MCH: 29.9 pg (ref 26.0–34.0)
MCHC: 32.8 g/dL (ref 30.0–36.0)
MCV: 91.2 fL (ref 80.0–100.0)
Monocytes Absolute: 0.9 10*3/uL (ref 0.1–1.0)
Monocytes Relative: 15 %
Neutro Abs: 3.9 10*3/uL (ref 1.7–7.7)
Neutrophils Relative %: 65 %
Platelets: 277 10*3/uL (ref 150–400)
RBC: 4.98 MIL/uL (ref 3.87–5.11)
RDW: 15.9 % — ABNORMAL HIGH (ref 11.5–15.5)
WBC: 6 10*3/uL (ref 4.0–10.5)
nRBC: 0 % (ref 0.0–0.2)

## 2023-11-26 LAB — PROTIME-INR
INR: 2 — ABNORMAL HIGH (ref 0.8–1.2)
Prothrombin Time: 22.6 s — ABNORMAL HIGH (ref 11.4–15.2)

## 2023-11-26 LAB — RESP PANEL BY RT-PCR (RSV, FLU A&B, COVID)  RVPGX2
Influenza A by PCR: POSITIVE — AB
Influenza B by PCR: NEGATIVE
Resp Syncytial Virus by PCR: NEGATIVE
SARS Coronavirus 2 by RT PCR: NEGATIVE

## 2023-11-26 LAB — LACTIC ACID, PLASMA
Lactic Acid, Venous: 1.9 mmol/L (ref 0.5–1.9)
Lactic Acid, Venous: 1.4 mmol/L (ref 0.5–1.9)

## 2023-11-26 MED ORDER — POTASSIUM CHLORIDE CRYS ER 20 MEQ PO TBCR
40.0000 meq | EXTENDED_RELEASE_TABLET | Freq: Once | ORAL | Status: AC
  Administered 2023-11-26: 40 meq via ORAL
  Filled 2023-11-26: qty 2

## 2023-11-26 MED ORDER — ACETAMINOPHEN 325 MG PO TABS
650.0000 mg | ORAL_TABLET | Freq: Once | ORAL | Status: AC
  Administered 2023-11-26: 650 mg via ORAL
  Filled 2023-11-26: qty 2

## 2023-11-26 NOTE — ED Triage Notes (Signed)
 Pt arrived via POV from home c/o cough, chills and body aches X 2 days.

## 2023-11-26 NOTE — Discharge Instructions (Signed)
 Rest and make sure you are drinking plenty of fluids.  I recommend continuing to take ibuprofen  for fever relief and body aches.    You will need to stay at home resting and away from others as the flu is contagious. This usually lasts for about 7 days. Get rechecked for any worsened symptoms, especially weakness or if you develop shortness of breath.  You have an abnormal lab result which which is probably an erroneous result, but I am asking you have this repeated by your primary MD in a week,  after you are feeling better.  This is a test called an INR which is 2.0 today which is elevated.  Show this to your doctor to have repeated next week.

## 2023-11-26 NOTE — ED Provider Notes (Signed)
 [place] EMERGENCY DEPARTMENT AT [facility] Provider Note   CSN: [id number] Arrival date & time: 11/26/23  1239     History  Chief Complaint  Patient presents with   Cough    Janet Mcguire is a 63 y.o. female with a history including GERD, hypertension, anxiety and depression presenting with a 2-day history of flulike symptoms including generalized bodyaches, hot flushes followed by cold chills and a cough which has been occasionally productive of a clear sputum along with clear nasal discharge, generalized myalgias and headache.  She has had poor appetite but has been able to keep up with fluid intake.  She denies abdominal pain, nausea or vomiting, no dysuria and reports has been urinating without difficulty.  She has taken ibuprofen  for fever relief.  The history is provided by the patient.       Home Medications Prior to Admission medications   Medication Sig Start Date End Date Taking? Authorizing Provider  albuterol  (VENTOLIN  HFA) 108 (90 Base) MCG/ACT inhaler Inhale 2 puffs into the lungs every 6 (six) hours as needed.    [provider]  amLODipine  (NORVASC ) 10 MG tablet Take 10 mg by mouth daily.  12/01/18   [provider]  cetirizine  (ZYRTEC ) 10 MG tablet Take 1 tablet (10 mg total) by mouth daily. 12/03/20   Landy Honora CROME, PA-C  diphenhydrAMINE  (BENADRYL ) 25 MG tablet Take 1 tablet (25 mg total) by mouth every 6 (six) hours as needed for itching. may cause drowsiness 02/05/22   Triplett, Tammy, PA-C  doxycycline  (VIBRAMYCIN ) 100 MG capsule Take 1 capsule (100 mg total) by mouth 2 (two) times daily. 12/21/21   Patsey Lot, MD  famotidine  (PEPCID ) 20 MG tablet Take 20 mg by mouth 2 (two) times daily. 10/07/22   [provider]  FLUoxetine  (PROZAC ) 20 MG capsule Take 20 mg by mouth daily.    [provider]  FLUoxetine  (PROZAC ) 40 MG capsule Take 40 mg by mouth daily.    [provider]  Lidocaine  4 % PTCH Apply 1  patch topically 2 (two) times daily. 12/18/21   Charlyn Sora, MD  meloxicam  (MOBIC ) 15 MG tablet Take 1 tablet by mouth daily. 03/21/20   [provider]  methocarbamol  (ROBAXIN ) 500 MG tablet Take 1 tablet (500 mg total) by mouth 2 (two) times daily. 12/18/21   Charlyn Sora, MD  metoprolol  tartrate (LOPRESSOR ) 25 MG tablet Take 1 tablet (25 mg total) by mouth 2 (two) times daily. 02/10/19   Alvan Dorn FALCON, MD  mupirocin  ointment (BACTROBAN ) 2 % Apply topically. 08/11/20   [provider]  nystatin -triamcinolone  ointment (MYCOLOG) Apply 1 application topically 2 (two) times daily. 07/07/20   Loetta Senior, MD  ondansetron  (ZOFRAN  ODT) 8 MG disintegrating tablet Take 1 tablet (8 mg total) by mouth every 8 (eight) hours as needed for nausea or vomiting. 09/03/20   Walisiewicz, Kaitlyn E, PA-C  oxyCODONE  (ROXICODONE ) 5 MG immediate release tablet Take 1 tablet (5 mg total) by mouth every 6 (six) hours as needed for severe pain. 12/21/21   Patsey Lot, MD  pantoprazole  (PROTONIX ) 20 MG tablet Take 1 tablet (20 mg total) by mouth daily. 09/05/20 01/03/21  Tobie Suzzane POUR, MD  triamcinolone  cream (KENALOG ) 0.1 % Apply 1 application. topically 2 (two) times daily. 02/05/22   Triplett, Tammy, PA-C  triamcinolone  ointment (KENALOG ) 0.1 % Apply 1 Application topically 2 (two) times daily.    [provider]  valACYclovir  (VALTREX ) 1000 MG tablet Take 1 tablet (  1,000 mg total) by mouth 3 (three) times daily. 12/21/21   Patsey Lot, MD      Allergies    Ibuprofen  and Acetaminophen     Review of Systems   Review of Systems  Constitutional:  Positive for chills and fever.  HENT:  Positive for rhinorrhea. Negative for congestion and sore throat.   Eyes: Negative.   Respiratory:  Positive for cough. Negative for chest tightness and shortness of breath.   Cardiovascular:  Negative for chest pain, palpitations and leg swelling.  Gastrointestinal:  Negative for abdominal  pain, diarrhea, nausea and vomiting.  Genitourinary: Negative.   Musculoskeletal:  Positive for myalgias. Negative for arthralgias, joint swelling and neck pain.  Skin: Negative.  Negative for rash and wound.  Neurological:  Negative for dizziness, weakness, light-headedness, numbness and headaches.  Psychiatric/Behavioral: Negative.      Physical Exam Updated Vital Signs BP 131/71 (BP Location: Right Arm)   Pulse 93   Temp 99.3 F (37.4 C) (Oral)   Resp 17   Ht 6' 1 (1.854 m)   Wt 109 kg   LMP  (LMP Unknown) Comment: last  one years ago   SpO2 96%   BMI 31.70 kg/m  Physical Exam Constitutional:      Appearance: She is well-developed.  HENT:     Head: Normocephalic and atraumatic.     Right Ear: Tympanic membrane and ear canal normal.     Left Ear: Tympanic membrane and ear canal normal.     Nose: Mucosal edema and rhinorrhea present.     Mouth/Throat:     Mouth: Mucous membranes are moist.     Pharynx: Oropharynx is clear. Uvula midline. No oropharyngeal exudate or posterior oropharyngeal erythema.     Tonsils: No tonsillar abscesses.  Eyes:     Conjunctiva/sclera: Conjunctivae normal.  Cardiovascular:     Rate and Rhythm: Normal rate.     Heart sounds: Normal heart sounds.  Pulmonary:     Effort: Pulmonary effort is normal. No respiratory distress.     Breath sounds: No wheezing, rhonchi or rales.  Abdominal:     Palpations: Abdomen is soft.     Tenderness: There is no abdominal tenderness. There is no guarding.  Musculoskeletal:        General: Normal range of motion.  Skin:    General: Skin is warm and dry.     Findings: No rash.  Neurological:     Mental Status: She is alert and oriented to person, place, and time.     ED Results / Procedures / Treatments   Labs (all labs ordered are listed, but only abnormal results are displayed) Labs Reviewed  RESP PANEL BY RT-PCR (RSV, FLU A&B, COVID)  RVPGX2 - Abnormal; Notable for the following components:       Result Value   Influenza A by PCR POSITIVE (*)    All other components within normal limits  COMPREHENSIVE METABOLIC PANEL - Abnormal; Notable for the following components:   Potassium 3.0 (*)    Glucose, Bld 146 (*)    Total Protein 8.2 (*)    All other components within normal limits  CBC WITH DIFFERENTIAL/PLATELET - Abnormal; Notable for the following components:   RDW 15.9 (*)    All other components within normal limits  PROTIME-INR - Abnormal; Notable for the following components:   Prothrombin Time 22.6 (*)    INR 2.0 (*)    All other components within normal limits  URINALYSIS, W/ REFLEX TO CULTURE (  INFECTION SUSPECTED) - Abnormal; Notable for the following components:   APPearance HAZY (*)    Glucose, UA 50 (*)    Hgb urine dipstick MODERATE (*)    Ketones, ur 20 (*)    Protein, ur >=300 (*)    Bacteria, UA RARE (*)    All other components within normal limits  CULTURE, BLOOD (ROUTINE X 2)  CULTURE, BLOOD (ROUTINE X 2)  URINE CULTURE  LACTIC ACID, PLASMA  LACTIC ACID, PLASMA    EKG None  Radiology DG Chest 2 View Result Date: 11/26/2023 CLINICAL DATA:  Suspected sepsis.  Cough, chills, body aches EXAM: CHEST - 2 VIEW COMPARISON:  CT and radiograph 12/28/2018 FINDINGS: Stable cardiomediastinal silhouette. Aortic atherosclerotic calcification. No focal consolidation, pleural effusion, or pneumothorax. No displaced rib fractures. IMPRESSION: No acute cardiopulmonary disease. Electronically Signed   By: Norman Gatlin M.D.   On: 11/26/2023 14:40    Procedures Procedures    Medications Ordered in ED Medications  potassium chloride  SA (KLOR-CON  M) CR tablet 40 mEq (has no administration in time range)    ED Course/ Medical Decision Making/ A&P                                 Medical Decision Making Patient presenting with flulike symptoms, differential including influenza, other viral URI, pneumonia, allergy.  She presents with a significant fever suggesting an  infectious source.  Her respiratory panel is positive for influenza A.  She has tolerated p.o. fluids here, she clinically is not dehydrated and has been tolerating p.o. fluids as well, she was encouraged to continue with the fluids, ibuprofen  for any fever reduction.  Her chest x-ray is clear, no pneumonia.  We discussed home treatment and also outlined strict return precautions for any worsening symptoms, particularly shortness of breath or worsening weakness.  She did have abnormal INR at 2.0, unclear etiology, patient denies history of hepatic problems.  I asked her to have this test repeated by her PCP next week once her flu symptoms are resolved.  Amount and/or Complexity of Data Reviewed Labs: ordered.    Details: Labs reviewed including urinalysis, she has a normal lactic acid, her c-Met is unremarkable except that she has a hypokalemia with potassium of 3.0, this was replaced orally here.  As mentioned above her INR is 2.0 with a PT of 22.6, unclear etiology, suspect this is erroneous.  She was asked to have this repeated by her PCP next week.  Her CBC is normal specifically her WBC count is 6.0, she is positive for influenza A. Radiology: ordered and independent interpretation performed.    Details: Chest x-ray reviewed and agree with interpretation, no acute findings, specifically no pneumonia.  Risk OTC drugs.           Final Clinical Impression(s) / ED Diagnoses Final diagnoses:  Influenza A    Rx / DC Orders ED Discharge Orders     None         Birdena Mliss RIGGERS 11/26/23 2012    Suzette Pac, MD 12/01/23 1040

## 2023-11-27 LAB — URINE CULTURE: Culture: <10000 — AB

## 2023-12-01 LAB — CULTURE, BLOOD (ROUTINE X 2)
Culture: NO GROWTH
Culture: NO GROWTH
Special Requests: ADEQUATE
Special Requests: ADEQUATE

## 2024-01-23 ENCOUNTER — Encounter: Payer: Self-pay | Admitting: Physician Assistant

## 2024-01-23 ENCOUNTER — Ambulatory Visit (INDEPENDENT_AMBULATORY_CARE_PROVIDER_SITE_OTHER): Payer: 59 | Admitting: Physician Assistant

## 2024-01-23 VITALS — BP 124/67 | HR 71 | Temp 96.8°F | Ht 73.0 in | Wt 231.4 lb

## 2024-01-23 DIAGNOSIS — R7303 Prediabetes: Secondary | ICD-10-CM

## 2024-01-23 DIAGNOSIS — I10 Essential (primary) hypertension: Secondary | ICD-10-CM

## 2024-01-23 DIAGNOSIS — Z7689 Persons encountering health services in other specified circumstances: Secondary | ICD-10-CM

## 2024-01-23 NOTE — Assessment & Plan Note (Signed)
 124/67 Controlled. Continue current medications. No change in management. Discussed DASH diet and dietary sodium restrictions.  Continue dietary efforts and physical activity.

## 2024-01-23 NOTE — Assessment & Plan Note (Signed)
 Lab results from Nov 2024 reviewed. A1c at that time 6.1. Will repeat A1c today. Discussed healthy diet and exercise. Patient advised to decrease sugary beverage intake.

## 2024-01-23 NOTE — Progress Notes (Signed)
 New Patient Office Visit  Subjective    Patient ID: Janet Mcguire, female    DOB: 23-Jan-1961  Age: 63 y.o. MRN: [id number]  CC: No chief complaint on file.   HPI Janet Mcguire presents to establish care  Patient with past medical history significant for hypertension, hyperlipidemia, prediabetes, osteoarthritis, and depression. Patient with minimal complaints today. Recent yearly physical in Nov 2024. She reports some right knee pain today.  No LMP recorded. Patient is postmenopausal. Date of last pap smear: 10/27/2018.  History abnormal pap smear: No Date of last mammogram: 10/30/2023.  Colorectal cancer screening: Yes, 09/10/2018 Last TDAP:Yes - 02/27/2015. Shingles vaccine: Yes  Current diet: Balanced diet, minimal sugar  Exercise: Yes, walks daily  Outpatient Encounter Medications as of 01/23/2024  Medication Sig   albuterol (VENTOLIN HFA) 108 (90 Base) MCG/ACT inhaler Inhale 2 puffs into the lungs every 6 (six) hours as needed.   amLODipine (NORVASC) 10 MG tablet Take 10 mg by mouth daily.    famotidine (PEPCID) 20 MG tablet Take 20 mg by mouth 2 (two) times daily.   FLUoxetine (PROZAC) 40 MG capsule Take 40 mg by mouth daily.   metoprolol tartrate (LOPRESSOR) 25 MG tablet Take 1 tablet (25 mg total) by mouth 2 (two) times daily.   [DISCONTINUED] cetirizine (ZYRTEC) 10 MG tablet Take 1 tablet (10 mg total) by mouth daily.   [DISCONTINUED] diphenhydrAMINE (BENADRYL) 25 MG tablet Take 1 tablet (25 mg total) by mouth every 6 (six) hours as needed for itching. may cause drowsiness   [DISCONTINUED] doxycycline (VIBRAMYCIN) 100 MG capsule Take 1 capsule (100 mg total) by mouth 2 (two) times daily.   [DISCONTINUED] FLUoxetine (PROZAC) 20 MG capsule Take 20 mg by mouth daily.   [DISCONTINUED] Lidocaine 4 % PTCH Apply 1 patch topically 2 (two) times daily.   [DISCONTINUED] meloxicam (MOBIC) 15 MG tablet Take 1 tablet by mouth daily.   [DISCONTINUED] methocarbamol (ROBAXIN) 500 MG tablet  Take 1 tablet (500 mg total) by mouth 2 (two) times daily.   [DISCONTINUED] mupirocin ointment (BACTROBAN) 2 % Apply topically.   [DISCONTINUED] nystatin-triamcinolone ointment (MYCOLOG) Apply 1 application topically 2 (two) times daily.   [DISCONTINUED] ondansetron (ZOFRAN ODT) 8 MG disintegrating tablet Take 1 tablet (8 mg total) by mouth every 8 (eight) hours as needed for nausea or vomiting.   [DISCONTINUED] oxyCODONE (ROXICODONE) 5 MG immediate release tablet Take 1 tablet (5 mg total) by mouth every 6 (six) hours as needed for severe pain.   [DISCONTINUED] pantoprazole (PROTONIX) 20 MG tablet Take 1 tablet (20 mg total) by mouth daily.   [DISCONTINUED] triamcinolone cream (KENALOG) 0.1 % Apply 1 application. topically 2 (two) times daily.   [DISCONTINUED] triamcinolone ointment (KENALOG) 0.1 % Apply 1 Application topically 2 (two) times daily.   [DISCONTINUED] valACYclovir (VALTREX) 1000 MG tablet Take 1 tablet (1,000 mg total) by mouth 3 (three) times daily.   No facility-administered encounter medications on file as of 01/23/2024.    Past Medical History:  Diagnosis Date   Allergy    Anxiety    Depression    Gastroesophageal reflux disease without esophagitis    Hypertension    Rectal bleeding 07/08/2018   Added automatically from request for surgery [id number]   Rectal irritation    chronic   Substance abuse (HCC)    "years ago"    Past Surgical History:  Procedure Laterality Date   CHOLECYSTECTOMY     COLONOSCOPY N/A 09/10/2018   Procedure: COLONOSCOPY;  Surgeon: Malissa Hippo, MD;  Location: [facility];  Service: Endoscopy;  Laterality: N/A;  2:00   ERCP N/A 01/12/2015   Procedure: ENDOSCOPIC RETROGRADE CHOLANGIOPANCREATOGRAPHY (ERCP) ;  Surgeon: Malissa Hippo, MD;  Location: [facility] ORS;  Service: Endoscopy;  Laterality: N/A;  Balloon Extraction   SPHINCTEROTOMY N/A 01/12/2015   Procedure: SPHINCTEROTOMY;  Surgeon: Malissa Hippo, MD;  Location: [facility] ORS;  Service: Endoscopy;   Laterality: N/A;   TUBAL LIGATION      Family History  Problem Relation Age of Onset   Diabetes Mother    Cancer Brother        throat   COPD Sister    Kidney disease Sister    Diabetes Sister    Diabetes Sister    Kidney disease Sister     Social History   Socioeconomic History   Marital status: Single    Spouse name: Not on file   Number of children: 1   Years of education: 8   Highest education level: Not on file  Occupational History   Occupation: disabled    Comment: "slow learner"  Tobacco Use   Smoking status: Never   Smokeless tobacco: Never  Vaping Use   Vaping status: Never Used  Substance and Sexual Activity   Alcohol use: Not Currently   Drug use: Yes    Types: Marijuana   Sexual activity: Not Currently    Birth control/protection: Surgical    Comment: tubal  Other Topics Concern   Not on file  Social History Narrative   Lives alone   Manages own household   "slow learner"   Walks for exercise   Does not drive   Does not read well   Youngest of 13 children [place]   Social Drivers of Health   Financial Resource Strain: Medium Risk (10/09/2018)   Overall Financial Resource Strain (CARDIA)    Difficulty of Paying Living Expenses: Somewhat hard  Food Insecurity: Food Insecurity Present (10/09/2018)   Hunger Vital Sign    Worried About Running Out of Food in the Last Year: Often true    Ran Out of Food in the Last Year: Not on file  Transportation Needs: No Transportation Needs (10/09/2018)   PRAPARE - Administrator, Civil Service (Medical): No    Lack of Transportation (Non-Medical): No  Physical Activity: Unknown (10/09/2018)   Exercise Vital Sign    Days of Exercise per Week: Patient declined    Minutes of Exercise per Session: Patient declined  Stress: Stress Concern Present (10/09/2018)   [employer] of Occupational Health - Occupational Stress Questionnaire    Feeling of Stress : Very much  Social  Connections: Unknown (10/09/2018)   Social Connection and Isolation Panel [NHANES]    Frequency of Communication with Friends and Family: Patient declined    Frequency of Social Gatherings with Friends and Family: Patient declined    Attends Religious Services: Patient declined    Database administrator or Organizations: Patient declined    Attends Banker Meetings: Patient declined    Marital Status: Patient declined  Intimate Partner Violence: Unknown (10/09/2018)   Humiliation, Afraid, Rape, and Kick questionnaire    Fear of Current or Ex-Partner: Patient declined    Emotionally Abused: Patient declined    Physically Abused: Patient declined    Sexually Abused: Patient declined    Review of Systems  Constitutional:  Negative for fever, malaise/fatigue and weight loss.  Respiratory:  Negative for shortness of breath.  Cardiovascular:  Negative for chest pain.  Gastrointestinal:  Negative for nausea and vomiting.  Musculoskeletal:  Positive for joint pain.        Objective    BP 124/67   Pulse 71   Temp (!) 96.8 F (36 C)   Ht 6\' 1"  (1.854 m)   Wt 231 lb 6.4 oz (105 kg)   LMP  (LMP Unknown) Comment: last  one years ago   SpO2 99%   BMI 30.53 kg/m   Physical Exam Constitutional:      Appearance: Normal appearance. She is obese.  HENT:     Head: Normocephalic.     Mouth/Throat:     Mouth: Mucous membranes are moist.     Pharynx: Oropharynx is clear.  Eyes:     Extraocular Movements: Extraocular movements intact.     Conjunctiva/sclera: Conjunctivae normal.  Cardiovascular:     Rate and Rhythm: Normal rate and regular rhythm.     Heart sounds: Normal heart sounds. No murmur heard. Pulmonary:     Effort: Pulmonary effort is normal.     Breath sounds: Normal breath sounds.  Musculoskeletal:     Right lower leg: No edema.     Left lower leg: No edema.  Skin:    General: Skin is warm and dry.  Neurological:     General: No focal deficit present.      Mental Status: She is alert and oriented to person, place, and time.  Psychiatric:        Mood and Affect: Mood normal.        Behavior: Behavior normal.       Assessment & Plan:  Encounter to establish care  Prediabetes Assessment & Plan: Lab results from Nov 2024 reviewed. A1c at that time 6.1. Will repeat A1c today. Discussed healthy diet and exercise. Patient advised to decrease sugary beverage intake.   Orders: -     Hemoglobin A1c  Benign essential HTN Assessment & Plan: 124/67 Controlled. Continue current medications. No change in management. Discussed DASH diet and dietary sodium restrictions.  Continue dietary efforts and physical activity.     Return in about 6 months (around 07/24/2024) for well woman wtih Pap.   Toni Amend Diondra Pines, PA-C

## 2024-01-24 LAB — HEMOGLOBIN A1C
Est. average glucose Bld gHb Est-mCnc: 117 mg/dL
Hgb A1c MFr Bld: 5.7 % — ABNORMAL HIGH (ref 4.8–5.6)

## 2024-04-26 ENCOUNTER — Telehealth: Payer: Self-pay | Admitting: *Deleted

## 2024-04-26 DIAGNOSIS — I10 Essential (primary) hypertension: Secondary | ICD-10-CM

## 2024-04-26 NOTE — Progress Notes (Unsigned)
 Complex Care Management Note Care Guide Note  04/26/2024 Name: Janet Mcguire MRN: [id number] DOB: [birth year]   Complex Care Management Outreach Attempts: An unsuccessful telephone outreach was attempted today to offer the patient information about available complex care management services.  Follow Up Plan:  Additional outreach attempts will be made to offer the patient complex care management information and services.   Encounter Outcome:  No Answer  Thedford Franks, CMA [place]  [facility], [facility] Guide Direct Dial: [phone]  Fax: [phone] Website: [email or web address]

## 2024-04-27 NOTE — Progress Notes (Unsigned)
 Complex Care Management Note Care Guide Note  04/27/2024 Name: Janet Mcguire MRN: [id number] DOB: [birth year]   Complex Care Management Outreach Attempts: A second unsuccessful outreach was attempted today to offer the patient with information about available complex care management services.  Follow Up Plan:  Additional outreach attempts will be made to offer the patient complex care management information and services.   Encounter Outcome:  No Answer  Thedford Franks, CMA [place]  [facility], [facility] Guide Direct Dial: [phone]  Fax: [phone] Website: [email or web address]

## 2024-04-28 NOTE — Progress Notes (Signed)
 Complex Care Management Note Care Guide Note  04/28/2024 Name: Janet Mcguire MRN: [id number] DOB: 1961-04-06   Complex Care Management Outreach Attempts: A third unsuccessful outreach was attempted today to offer the patient with information about available complex care management services.  Follow Up Plan:  No further outreach attempts will be made at this time. We have been unable to contact the patient to offer or enroll patient in complex care management services.  Encounter Outcome:  No Answer  Thedford Franks, CMA [place]  [facility], [facility] Guide Direct Dial: [phone]  Fax: [phone] Website: [email or web address]

## 2024-07-22 ENCOUNTER — Ambulatory Visit (INDEPENDENT_AMBULATORY_CARE_PROVIDER_SITE_OTHER): Admitting: Physician Assistant

## 2024-07-22 ENCOUNTER — Encounter: Payer: Self-pay | Admitting: Physician Assistant

## 2024-07-22 ENCOUNTER — Encounter: Admitting: Physician Assistant

## 2024-07-22 ENCOUNTER — Other Ambulatory Visit (HOSPITAL_COMMUNITY)
Admission: RE | Admit: 2024-07-22 | Discharge: 2024-07-22 | Disposition: A | Source: Ambulatory Visit | Attending: Physician Assistant | Admitting: Physician Assistant

## 2024-07-22 VITALS — BP 125/73 | HR 70 | Ht 73.0 in | Wt 229.0 lb

## 2024-07-22 DIAGNOSIS — Z124 Encounter for screening for malignant neoplasm of cervix: Secondary | ICD-10-CM

## 2024-07-22 DIAGNOSIS — R7303 Prediabetes: Secondary | ICD-10-CM | POA: Diagnosis not present

## 2024-07-22 DIAGNOSIS — Z1231 Encounter for screening mammogram for malignant neoplasm of breast: Secondary | ICD-10-CM

## 2024-07-22 DIAGNOSIS — Z23 Encounter for immunization: Secondary | ICD-10-CM

## 2024-07-22 DIAGNOSIS — Z0001 Encounter for general adult medical examination with abnormal findings: Secondary | ICD-10-CM

## 2024-07-22 DIAGNOSIS — Z1151 Encounter for screening for human papillomavirus (HPV): Secondary | ICD-10-CM

## 2024-07-22 DIAGNOSIS — E559 Vitamin D deficiency, unspecified: Secondary | ICD-10-CM

## 2024-07-22 DIAGNOSIS — I10 Essential (primary) hypertension: Secondary | ICD-10-CM | POA: Diagnosis not present

## 2024-07-22 DIAGNOSIS — Z0000 Encounter for general adult medical examination without abnormal findings: Secondary | ICD-10-CM

## 2024-07-22 MED ORDER — FLUOXETINE HCL 40 MG PO CAPS: 40.0000 mg | ORAL_CAPSULE | Freq: Every day | ORAL | 1 refills | Status: AC

## 2024-07-22 MED ORDER — METOPROLOL TARTRATE 25 MG PO TABS: 25.0000 mg | ORAL_TABLET | Freq: Two times a day (BID) | ORAL | 1 refills | Status: AC

## 2024-07-22 MED ORDER — FAMOTIDINE 20 MG PO TABS: 20.0000 mg | ORAL_TABLET | Freq: Two times a day (BID) | ORAL | 1 refills | Status: AC

## 2024-07-22 MED ORDER — AMLODIPINE BESYLATE 10 MG PO TABS: 10.0000 mg | ORAL_TABLET | Freq: Every day | ORAL | 1 refills | Status: AC

## 2024-07-22 NOTE — Patient Instructions (Signed)
 Please call Janet Mcguire Mammography/Bone density scheduling [phone]

## 2024-07-22 NOTE — Progress Notes (Addendum)
 Complete physical exam  Patient: Janet Mcguire   DOB: September 05, 1961   63 y.o. Female  MRN: [id number]  Subjective:    Chief Complaint  Patient presents with   Medical Management of Chronic Issues    Physical, pt also states she needs something for her back and head, been hurting off and on.    Janet Mcguire is a 63 y.o. female who presents today for a complete physical exam. She reports consuming a general diet. Patient states she is walking a lot for exercise. She generally feels fairly well. She reports sleeping fairly well. She does not have additional problems to discuss today.    Most recent fall risk assessment:    07/22/2024   10:21 AM  Fall Risk   Falls in the past year? 0     Most recent depression screenings:    07/22/2024   10:20 AM 01/23/2024    9:56 AM  PHQ 2/9 Scores  PHQ - 2 Score  2  PHQ- 9 Score  6  Exception Documentation Other- indicate reason in comment box     Vision:Not within last year  and Dental: No current dental problems and No regular dental care   Patient Care Team: Lachlyn Vanderstelt, [place], [place] as PCP - General (Physician Assistant) Alvan Dorn FAL[place], [place] as PCP - Cardiology (Cardiology)   Outpatient Medications Prior to Visit  Medication Sig   albuterol  (VENTOLIN  HFA) 108 (90 Base) MCG/ACT inhaler Inhale 2 puffs into the lungs every 6 (six) hours as needed.   [DISCONTINUED] amLODipine  (NORVASC ) 10 MG tablet Take 10 mg by mouth daily.    [DISCONTINUED] famotidine  (PEPCID ) 20 MG tablet Take 20 mg by mouth 2 (two) times daily.   [DISCONTINUED] FLUoxetine (PROZAC) 40 MG capsule Take 40 mg by mouth daily.   [DISCONTINUED] metoprolol  tartrate (LOPRESSOR ) 25 MG tablet Take 1 tablet (25 mg total) by mouth 2 (two) times daily.   No facility-administered medications prior to visit.    Review of Systems  Constitutional:  Negative for chills, fever and malaise/fatigue.  Eyes:  Negative for blurred vision and double vision.  Respiratory:  Negative for cough  and shortness of breath.   Cardiovascular:  Negative for chest pain and palpitations.  Gastrointestinal:  Positive for abdominal pain and diarrhea.  Musculoskeletal:  Negative for joint pain and myalgias.  Neurological:  Negative for dizziness and headaches.       Objective:     BP 125/73   Pulse 70   Ht 6' 1 (1.854 m)   Wt 229 lb 0.6 oz (103.9 kg)   LMP  (LMP Unknown) Comment: last  one years ago   SpO2 98%   BMI 30.22 kg/m   Physical Exam Constitutional:      Appearance: Normal appearance. She is obese.  HENT:     Head: Normocephalic and atraumatic.     Right Ear: Tympanic membrane normal.     Left Ear: Tympanic membrane normal.     Mouth/Throat:     Mouth: Mucous membranes are moist.     Pharynx: Oropharynx is clear. No posterior oropharyngeal erythema.  Eyes:     Extraocular Movements: Extraocular movements intact.     Conjunctiva/sclera: Conjunctivae normal.  Cardiovascular:     Rate and Rhythm: Normal rate and regular rhythm.     Heart sounds: Normal heart sounds. No murmur heard. Pulmonary:     Effort: Pulmonary effort is normal.     Breath sounds: No wheezing or rhonchi.  Abdominal:  General: Abdomen is flat. Bowel sounds are normal.     Palpations: Abdomen is soft.     Tenderness: There is no abdominal tenderness.  Genitourinary:    General: Normal vulva.     Labia:        Right: No rash or tenderness.        Left: No rash or tenderness.      Vagina: No vaginal discharge, erythema, tenderness or bleeding.     Cervix: Discharge present. No cervical motion tenderness, erythema or cervical bleeding.     Comments: Chaperone deferred  Musculoskeletal:     Cervical back: Normal range of motion and neck supple. No tenderness.     Right lower leg: No edema.     Left lower leg: No edema.  Lymphadenopathy:     Cervical: No cervical adenopathy.  Skin:    General: Skin is warm and dry.  Neurological:     General: No focal deficit present.     Mental  Status: She is alert and oriented to person, place, and time.  Psychiatric:        Mood and Affect: Mood normal.        Behavior: Behavior normal.      No results found for any visits on 07/22/24.    Assessment & Plan:    Routine Health Maintenance and Physical Exam  Health Maintenance  Topic Date Due   Pneumococcal Vaccine for age over 101 (1 of 2 - PCV) Never done   Zoster (Shingles) Vaccine (2 of 2) 10/04/2022   Pap with HPV screening  10/28/2023   COVID-19 Vaccine (3 - 2025-26 season) 08/07/2024*   DTaP/Tdap/Td vaccine (2 - Td or Tdap) 02/26/2025   Breast Cancer Screening  10/29/2025   Colon Cancer Screening  09/10/2028   Flu Shot  Completed   Hepatitis C Screening  Completed   HIV Screening  Completed   Hepatitis B Vaccine  Aged Out   HPV Vaccine  Aged Out   Meningitis B Vaccine  Aged Out  *Topic was postponed. The date shown is not the original due date.    Discussed health benefits of physical activity, and encouraged her to engage in regular exercise appropriate for her age and condition.  Problem List Items Addressed This Visit     Benign essential HTN   Relevant Medications   amLODipine  (NORVASC ) 10 MG tablet   metoprolol  tartrate (LOPRESSOR ) 25 MG tablet   Other Relevant Orders   Lipid panel   CMP14+EGFR   CBC with Differential/Platelet   Vitamin D  deficiency   Relevant Orders   Vitamin D  (25 hydroxy)   Prediabetes   Relevant Orders   CMP14+EGFR   HgB A1c   Other Visit Diagnoses       Annual visit for general adult medical examination without abnormal findings    -  Primary     Screening for cervical cancer       Relevant Orders   Cytology - PAP     Screening for human papillomavirus (HPV)       Relevant Orders   Cytology - PAP     Encounter for screening mammogram for malignant neoplasm of breast       Relevant Orders   MM 3D SCREENING MAMMOGRAM BILATERAL BREAST     Immunization due       Relevant Orders   Flu vaccine trivalent PF, 6mos and  older(Flulaval,Afluria,Fluarix,Fluzone) (Completed)      Return in about 6 months (around 01/20/2025) for  BP/preDM.  Safety measures discussed. Immunizations reviewed: flu shot today, reccommended 2nd shingles today Diet and exercise/ lifestyle modifications discussed: Recommend 150 minutes per week of exercise such as walking. Recommend lots of fresh produce to include fruits, vegetables, beans, healthy fats such as avocado, nuts, seeds, and 3-6 ounces of protein at each meal.  Avoid fried foods and fast food. Limit alcohol consumption: no more than one drink per day for women and 2 drinks per day for men.  Stress management discussed. Routine vision and dental screening discussed: recommend dentist every 6 months, gets vision checked every 1-2 years.  Health maintenance: Pap today, mammogram ordered for January, colonoscopy due in 2029.  Questions answered.       Charmaine Mubashir Mallek, PA-C

## 2024-07-22 NOTE — Addendum Note (Signed)
 Addended by: MANCIL PFEIFFER on: 07/22/2024 10:44 AM   Modules accepted: Orders

## 2024-07-23 ENCOUNTER — Other Ambulatory Visit: Payer: Self-pay | Admitting: Physician Assistant

## 2024-07-23 ENCOUNTER — Ambulatory Visit: Payer: Self-pay | Admitting: Physician Assistant

## 2024-07-23 DIAGNOSIS — R7989 Other specified abnormal findings of blood chemistry: Secondary | ICD-10-CM

## 2024-07-23 LAB — CBC WITH DIFFERENTIAL/PLATELET
Basophils Absolute: 0.1 x10E3/uL (ref 0.0–0.2)
Basos: 1 %
EOS (ABSOLUTE): 0.1 x10E3/uL (ref 0.0–0.4)
Eos: 1 %
Hematocrit: 42.9 % (ref 34.0–46.6)
Hemoglobin: 13.8 g/dL (ref 11.1–15.9)
Immature Grans (Abs): 0 x10E3/uL (ref 0.0–0.1)
Immature Granulocytes: 0 %
Lymphocytes Absolute: 2.4 x10E3/uL (ref 0.7–3.1)
Lymphs: 31 %
MCH: 30.1 pg (ref 26.6–33.0)
MCHC: 32.2 g/dL (ref 31.5–35.7)
MCV: 94 fL (ref 79–97)
Monocytes Absolute: 0.7 x10E3/uL (ref 0.1–0.9)
Monocytes: 9 %
Neutrophils Absolute: 4.5 x10E3/uL (ref 1.4–7.0)
Neutrophils: 58 %
Platelets: 348 x10E3/uL (ref 150–450)
RBC: 4.59 x10E6/uL (ref 3.77–5.28)
RDW: 13.8 % (ref 11.7–15.4)
WBC: 7.7 x10E3/uL (ref 3.4–10.8)

## 2024-07-23 LAB — LIPID PANEL
Chol/HDL Ratio: 2.8 ratio (ref 0.0–4.4)
Cholesterol, Total: 188 mg/dL (ref 100–199)
HDL: 68 mg/dL (ref >39)
LDL Chol Calc (NIH): 110 mg/dL — ABNORMAL HIGH (ref 0–99)
Triglycerides: 51 mg/dL (ref 0–149)
VLDL Cholesterol Cal: 10 mg/dL (ref 5–40)

## 2024-07-23 LAB — CMP14+EGFR
ALT: 17 IU/L (ref 0–32)
AST: 19 IU/L (ref 0–40)
Albumin: 4.2 g/dL (ref 3.9–4.9)
Alkaline Phosphatase: 85 IU/L (ref 49–135)
BUN/Creatinine Ratio: 11 — ABNORMAL LOW (ref 12–28)
BUN: 12 mg/dL (ref 8–27)
Bilirubin Total: 0.5 mg/dL (ref 0.0–1.2)
CO2: 22 mmol/L (ref 20–29)
Calcium: 9.6 mg/dL (ref 8.7–10.3)
Chloride: 103 mmol/L (ref 96–106)
Creatinine, Ser: 1.09 mg/dL — ABNORMAL HIGH (ref 0.57–1.00)
Globulin, Total: 3.3 g/dL (ref 1.5–4.5)
Glucose: 87 mg/dL (ref 70–99)
Potassium: 4.2 mmol/L (ref 3.5–5.2)
Sodium: 141 mmol/L (ref 134–144)
Total Protein: 7.5 g/dL (ref 6.0–8.5)
eGFR: 58 mL/min/1.73 — ABNORMAL LOW (ref >59)

## 2024-07-23 LAB — HEMOGLOBIN A1C
Est. average glucose Bld gHb Est-mCnc: 108 mg/dL
Hgb A1c MFr Bld: 5.4 % (ref 4.8–5.6)

## 2024-07-23 LAB — VITAMIN D 25 HYDROXY (VIT D DEFICIENCY, FRACTURES): Vit D, 25-Hydroxy: 13 ng/mL — ABNORMAL LOW (ref 30.0–100.0)

## 2024-07-26 LAB — CYTOLOGY - PAP: Diagnosis: NEGATIVE

## 2024-08-12 ENCOUNTER — Ambulatory Visit: Payer: Self-pay

## 2024-08-12 NOTE — Telephone Encounter (Signed)
 Patient wants a call back at this number: [phone]  Patient would like Vitamin D  as recommended by PCP sent to: [facility] - [place], [place] - [address]  [address] [place], [place] [address]  Phone: [phone]  Fax: [phone]   Patient also wants advice on being advised to hydrate because she states she drinks a lot of water  already.      FYI Only or Action Required?: Action required by provider: clinical question for provider, lab or test result follow-up needed, and wants advice on how to hydrate more and vitamin d  sent to pharmacy.  Patient was last seen in primary care on 07/22/2024 by Grooms, [place], [place].  Called Nurse Triage reporting Results.  Triage Disposition: Call PCP When Office is Open  Patient/caregiver understands and will follow disposition?: Yes                 Copied from CRM #[id number]. Topic: Clinical - Lab/Test Results >> Aug 12, 2024  8:13 AM Tobias CROME wrote: Reason for CRM: Relayed results to patient. Has additional questions. Reason for Disposition  [1] Caller requesting NON-URGENT health information AND [2] PCP's office is the best resource  Answer Assessment - Initial Assessment Questions Patient wants a call back at this number: [phone]  Patient would like Vitamin D  as recommended by PCP sent to: [facility] - [place], [place] - [address]  [address] [place], [place] [address]  Phone: [phone]  Fax: [phone]   Patient also wants advice on being advised to hydrate because she states she drinks a lot of water  already.  Patient is read her provider's notes about her results verbatim:  Grooms, Charmaine, [place] to [facility]    07/26/24  2:14 PM Result Note Normal Pap, no signs of cervical cancer, repeat in 5 years.  Cytology - PAP Grooms, Charmaine, PA-C to [facility]     07/23/24  9:07 AM Result Note Lab results reviewed.  Cholesterol is overall well controlled,  LDL bad cholesterol is slightly elevated, work on healthy diet and exercise.  Vitamin D  is low- advise 600-800 IUs of vitamin D3 daily.   Electrolytes and liver enzymes are within normal limits. Kidney function is slightly decreased, potentially fro, dehydration. Please repeat lab work in 2 weeks, advise good hydration prior to blood draw.  A1c is within normal limits. No longer prediabetic, however continue with healthy diet and exercise.  Blood count is within normal limits.   Patient is advised to call us  back if anything changes or with any further questions/concerns. Patient is advised that if anything worsens to go to the Emergency Room. Patient verbalized understanding.  Protocols used: Information Only Call - No Triage-A-AH

## 2024-08-13 ENCOUNTER — Telehealth: Payer: Self-pay

## 2024-08-13 ENCOUNTER — Other Ambulatory Visit: Payer: Self-pay | Admitting: Physician Assistant

## 2024-08-13 DIAGNOSIS — E559 Vitamin D deficiency, unspecified: Secondary | ICD-10-CM

## 2024-08-13 MED ORDER — VITAMIN D (ERGOCALCIFEROL) 1.25 MG (50000 UNIT) PO CAPS: 50000.0000 [IU] | ORAL_CAPSULE | ORAL | 1 refills | Status: DC

## 2024-08-13 NOTE — Telephone Encounter (Signed)
 Copied from CRM [id number]. Topic: Clinical - Medication Question >> Aug 13, 2024  9:23 AM Travis F wrote: Reason for CRM: Patient is calling in checking the status of her request for Vitamin D . Please follow up with patient.

## 2024-08-13 NOTE — Telephone Encounter (Signed)
 Telephone call no answer

## 2024-08-16 NOTE — Telephone Encounter (Signed)
 Left message for patient to call back to discuss results and recommendations.  Ok for E2C2 to discuss.

## 2024-08-16 NOTE — Telephone Encounter (Signed)
-----   Message from Yale Grooms sent at 07/23/2024  9:07 AM EDT ----- Lab results reviewed.  Cholesterol is overall well controlled, LDL bad cholesterol is slightly elevated, work on healthy diet and exercise.  Vitamin D  is low- advise 600-800 IUs of vitamin D3 daily.   Electrolytes and liver enzymes are within normal limits. Kidney function is slightly decreased, potentially fro, dehydration. Please repeat lab work in 2 weeks, advise good hydration prior to blood  draw.  A1c is within normal limits. No longer prediabetic, however continue with healthy diet and exercise.  Blood count is within normal limits.  ----- Message ----- From: Interface, [facility] Lab Results In Sent: 07/23/2024   5:35 AM EDT To: Charmaine Grooms, PA-C

## 2024-08-26 DIAGNOSIS — R7989 Other specified abnormal findings of blood chemistry: Secondary | ICD-10-CM | POA: Diagnosis not present

## 2024-08-26 NOTE — Progress Notes (Signed)
 Did speak with patient and informed per provider result notes and recommendations, she verbalized understanding.

## 2024-08-27 ENCOUNTER — Ambulatory Visit: Payer: Self-pay | Admitting: Physician Assistant

## 2024-08-27 ENCOUNTER — Other Ambulatory Visit: Payer: Self-pay | Admitting: Physician Assistant

## 2024-08-27 ENCOUNTER — Telehealth: Payer: Self-pay | Admitting: *Deleted

## 2024-08-27 DIAGNOSIS — E559 Vitamin D deficiency, unspecified: Secondary | ICD-10-CM

## 2024-08-27 LAB — BASIC METABOLIC PANEL WITH GFR
BUN/Creatinine Ratio: 11 — ABNORMAL LOW (ref 12–28)
BUN: 11 mg/dL (ref 8–27)
CO2: 21 mmol/L (ref 20–29)
Calcium: 9.3 mg/dL (ref 8.7–10.3)
Chloride: 105 mmol/L (ref 96–106)
Creatinine, Ser: 0.98 mg/dL (ref 0.57–1.00)
Glucose: 105 mg/dL — ABNORMAL HIGH (ref 70–99)
Potassium: 4.4 mmol/L (ref 3.5–5.2)
Sodium: 141 mmol/L (ref 134–144)
eGFR: 66 mL/min/1.73 (ref >59)

## 2024-08-27 MED ORDER — VITAMIN D (ERGOCALCIFEROL) 1.25 MG (50000 UNIT) PO CAPS: 50000.0000 [IU] | ORAL_CAPSULE | ORAL | 1 refills | Status: AC

## 2024-08-27 NOTE — Telephone Encounter (Signed)
 Grooms, [place], [place]      08/27/24 11:02 AM Medication refilled

## 2024-08-27 NOTE — Telephone Encounter (Signed)
 Copied from CRM [id number]. Topic: Clinical - Prescription Issue >> Aug 27, 2024  9:27 AM Janet Mcguire wrote: Reason for CRM: Hoy w/ [facility] called in stating she needs to get in contact w/ PCP or assistant due to early on refill and was taking Vitamin D , Ergocalciferol , (DRISDOL ) 1.25 MG (50000 UNIT) CAPS capsule daily instead of weekly. Please call [phone] to pharmacist (ask for meredith)

## 2024-08-27 NOTE — Progress Notes (Signed)
 Called pt mailbox is full

## 2024-08-30 ENCOUNTER — Other Ambulatory Visit: Payer: Self-pay | Admitting: Physician Assistant

## 2024-08-30 ENCOUNTER — Ambulatory Visit: Payer: Self-pay

## 2024-08-30 DIAGNOSIS — E559 Vitamin D deficiency, unspecified: Secondary | ICD-10-CM

## 2024-08-30 NOTE — Telephone Encounter (Signed)
 Patient notified and verbalized understanding.

## 2024-08-30 NOTE — Telephone Encounter (Signed)
 FYI Only or Action Required?: Action required by provider: clinical question for provider.  Patient was last seen in primary care on 07/22/2024 by Grooms, [place], [place].  Called Nurse Triage reporting Medication question.  Symptoms began today.  Interventions attempted: Nothing.  Symptoms are: stable. Took Vit D 50,000 daily instead of weekly. No symptoms. Poison Control states to notify PCP, lab work may be needed. Pt. Verbalizes understanding. Please advise pt.  Triage Disposition: Call [facility] Now  Patient/caregiver understands and will follow disposition?: Yes     Copied from CRM [id number]. Topic: Clinical - Red Word Triage >> Aug 30, 2024  8:04 AM Ivette P wrote: [facility] that prompted transfer to Nurse Triage: PT took too many pills of her medication Vitamin D , Ergocalciferol , (DRISDOL ) 1.25 MG (50000 UNIT) CAPS capsule Reason for Disposition  MORE THAN A DOUBLE DOSE of a prescription or over-the-counter (OTC) drug  Answer Assessment - Initial Assessment Questions 1. NAME of MEDICINE: What medicine(s) are you calling about?     Vit D  50,000 2. QUESTION: What is your question? (e.g., double dose of medicine, side effect)     How to take medicine 3. PRESCRIBER: Who prescribed the medicine? Reason: if prescribed by specialist, call should be referred to that group.     PCP 4. SYMPTOMS: Do you have any symptoms? If Yes, ask: What symptoms are you having?  How bad are the symptoms (e.g., mild, moderate, severe)     no 5. PREGNANCY:  Is there any chance that you are pregnant? When was your last menstrual period?     no  Protocols used: Medication Question Call-A-AH

## 2024-08-31 NOTE — Progress Notes (Signed)
 Called patient and mailbox was full. Was not able to leave a voicemail

## 2024-09-01 ENCOUNTER — Telehealth: Payer: Self-pay | Admitting: *Deleted

## 2024-09-01 NOTE — Telephone Encounter (Signed)
 Copied from CRM [id number]. Topic: Clinical - Request for Lab/Test Order >> Aug 31, 2024  2:55 PM Amber H wrote: Reason for CRM: Patient was told to request for repeat metabolic labs for follow up.

## 2024-09-02 NOTE — Telephone Encounter (Signed)
 Pt informed and verbalized understanding

## 2024-11-01 ENCOUNTER — Ambulatory Visit (HOSPITAL_COMMUNITY)

## 2024-11-02 ENCOUNTER — Ambulatory Visit: Payer: Self-pay

## 2024-11-02 NOTE — Telephone Encounter (Signed)
 PT has appt tomorrow at 8 for knee pain

## 2024-11-02 NOTE — Telephone Encounter (Signed)
 FYI Only or Action Required?: FYI only for provider: appointment scheduled on 11/03/24.  Patient was last seen in primary care on 07/22/2024 by Grooms, [place], [place].  Called Nurse Triage reporting Knee Pain and Hand Pain.  Symptoms began several weeks ago.  Interventions attempted: OTC medications: medication patch.  Symptoms are: gradually worsening.  Triage Disposition: See PCP When Office is Open (Within 3 Days)  Patient/caregiver understands and will follow disposition?: Yes     Copied from CRM #[id number]. Topic: Clinical - Red Word Triage >> Nov 02, 2024  8:31 AM Treva T wrote: Red Word that prompted transfer to Nurse Triage: Pt states right thumb and right knee pain, pain level of 8. Pt reports she is having difficulty sleeping. Also having pain with walking.  Pt is requesting an appt for evaluation.   Reason for Disposition  [1] MODERATE pain (e.g., interferes with normal activities, limping) AND [2] present > 3 days  Answer Assessment - Initial Assessment Questions Pt called in to report R knee and R thumb pain x several weeks. Pt has been using medication patch; unable to take tylenol  or ibuprofen . Pt reports she used to get injections to her knee but it has been several years since last injection. Pt reports pain is worsening 8/10 and keeping her up at night. Pt states that knee pain is aggravated by walking. Appointment scheduled for evaluation. Patient agrees with plan of care, and will call back if anything changes, or if symptoms worsen.      1. LOCATION and RADIATION: Where is the pain located?      R knee   2. QUALITY: What does the pain feel like?  (e.g., sharp, dull, aching, burning)     Aching   3. SEVERITY: How bad is the pain? What does it keep you from doing?   (Scale 1-10; or mild, moderate, severe)     8/10  4. ONSET: When did the pain start? Does it come and go, or is it there all the time?     Hx of knee pain; pt reports getting  injections but it has been several years   5. RECURRENT: Have you had this pain before? If Yes, ask: When, and what happened then?     Yes; injections to knee   6. SETTING: Has there been any recent work, exercise or other activity that involved that part of the body?      No   8. ASSOCIATED SYMPTOMS: Is there any swelling or redness of the knee?     No   9. OTHER SYMPTOMS: Do you have any other symptoms? (e.g., calf pain, chest pain, difficulty breathing, fever)     No  Protocols used: Knee Pain-A-AH

## 2024-11-03 ENCOUNTER — Ambulatory Visit: Payer: Self-pay | Admitting: Physician Assistant

## 2024-11-03 ENCOUNTER — Encounter: Payer: Self-pay | Admitting: Physician Assistant

## 2024-11-03 ENCOUNTER — Ambulatory Visit (INDEPENDENT_AMBULATORY_CARE_PROVIDER_SITE_OTHER): Admitting: Physician Assistant

## 2024-11-03 ENCOUNTER — Ambulatory Visit (HOSPITAL_COMMUNITY)
Admission: RE | Admit: 2024-11-03 | Discharge: 2024-11-03 | Disposition: A | Discharge status: Home / Self Care | Source: Ambulatory Visit | Attending: Physician Assistant | Admitting: Physician Assistant

## 2024-11-03 VITALS — BP 123/77 | HR 73 | Temp 97.2°F | Ht 73.0 in | Wt 233.0 lb

## 2024-11-03 DIAGNOSIS — M25561 Pain in right knee: Secondary | ICD-10-CM

## 2024-11-03 DIAGNOSIS — M79644 Pain in right finger(s): Secondary | ICD-10-CM

## 2024-11-03 DIAGNOSIS — G8929 Other chronic pain: Secondary | ICD-10-CM | POA: Insufficient documentation

## 2024-11-03 DIAGNOSIS — Z1231 Encounter for screening mammogram for malignant neoplasm of breast: Secondary | ICD-10-CM | POA: Insufficient documentation

## 2024-11-03 DIAGNOSIS — K219 Gastro-esophageal reflux disease without esophagitis: Secondary | ICD-10-CM | POA: Diagnosis not present

## 2024-11-03 DIAGNOSIS — F411 Generalized anxiety disorder: Secondary | ICD-10-CM | POA: Diagnosis not present

## 2024-11-03 MED ORDER — OMEPRAZOLE 20 MG PO CPDR: 20.0000 mg | DELAYED_RELEASE_CAPSULE | Freq: Every day | ORAL | 3 refills | Status: AC

## 2024-11-03 MED ORDER — CELECOXIB 100 MG PO CAPS: 100.0000 mg | ORAL_CAPSULE | Freq: Two times a day (BID) | ORAL | 1 refills | Status: AC

## 2024-11-03 NOTE — Assessment & Plan Note (Signed)
 GERD symptoms worsened by heat, previous Pepcid  ineffective. - Start omeprazole  daily for symptoms. Advised to take first thing on an empty stomach.

## 2024-11-03 NOTE — Assessment & Plan Note (Signed)
 Chronic right knee pain worsening, no swelling or redness, pain with walking. Generalized tenderness throughout on exam. Normal ROM.  - Ordered x-ray of right knee. - Referred to orthopedics for evaluation and potential knee injection. - Prescribed Celebrex  for pain management. - Follow up if symptoms do not improve in 4-6 weeks or if symptoms worsen.

## 2024-11-03 NOTE — Assessment & Plan Note (Signed)
 Prozac  ineffective for anxiety management. - Scheduled follow-up appointment in six weeks to discuss anxiety management. - Continue Prozac  daily.

## 2024-11-03 NOTE — Assessment & Plan Note (Signed)
 Patient presents today with acute right thumb pain at night, affecting sleep, no swelling or decreased mobility. Normal physical exam, mild tenderness over MCP joint. No snuffbox tenderness or erythema.  - Ordered x-ray of right thumb. - Prescribed Celebrex  for pain management. - Follow up if symptoms do not improve in 4-6 weeks or if symptoms worsen.

## 2024-11-03 NOTE — Progress Notes (Signed)
 "  Acute Office Visit  Subjective:     Patient ID: Janet Mcguire, female    DOB: 09/28/1961, 64 y.o.   MRN: [id number]   Discussed the use of AI scribe software for clinical note transcription with the patient, who gave verbal consent to proceed.  History of Present Illness The patient presents with right knee and thumb pain. She has had right knee pain since the fall, with marked worsening over the past week. Pain is worse with walking and sometimes makes ambulation difficult. She notes no swelling, redness, or recent knee injury. She previously received a knee injection from another provider.  She also endorses right thumb pain that is worse at night and wakes her from sleep. Pain radiates into the hand and sometimes makes thumb movement difficult, but she notes no swelling or loss of strength or hand mobility and denies injury.  She cannot take Tylenol  or ibuprofen  due to stomach pain. Patient endorse stomach pain and frequent urination when the home heat is on. The stomach pain sometimes radiates to her chest. She has nocturia up to ten times nightly. She has no diarrhea. She stopped Pepcid  because it did not help. Her current anxiety medication, Prozac , is not relieving her symptoms.    Review of Systems  Constitutional:  Negative for activity change, appetite change, fatigue and fever.  Respiratory:  Negative for cough and shortness of breath.   Cardiovascular:  Negative for chest pain.  Gastrointestinal:  Positive for abdominal pain. Negative for constipation, diarrhea, nausea and vomiting.  Genitourinary:  Positive for frequency.  Musculoskeletal:  Positive for arthralgias. Negative for gait problem, joint swelling and myalgias.  Neurological:  Negative for light-headedness and headaches.  Psychiatric/Behavioral:  Negative for dysphoric mood. The patient is nervous/anxious.          Objective:     BP 123/77 (BP Location: Left Arm, Patient Position: Sitting)   Pulse 73    Temp (!) 97.2 F (36.2 C)   Ht 6' 1 (1.854 m)   Wt 233 lb (105.7 kg)   LMP  (LMP Unknown) Comment: last  one years ago   SpO2 99%   BMI 30.74 kg/m   Physical Exam Constitutional:      General: She is not in acute distress.    Appearance: Normal appearance. She is obese. She is not ill-appearing.  HENT:     Head: Normocephalic and atraumatic.     Mouth/Throat:     Mouth: Mucous membranes are moist.     Pharynx: Oropharynx is clear.  Eyes:     Extraocular Movements: Extraocular movements intact.     Conjunctiva/sclera: Conjunctivae normal.  Cardiovascular:     Rate and Rhythm: Normal rate and regular rhythm.     Heart sounds: Normal heart sounds. No murmur heard. Pulmonary:     Effort: Pulmonary effort is normal.     Breath sounds: Normal breath sounds.  Musculoskeletal:     Right hand: Bony tenderness (tenderness over 1st digit MCP joint, no anatomical snuffbox tenderness) present. No swelling, deformity or tenderness. Normal range of motion. Normal strength. Normal sensation. Normal capillary refill.     Right knee: No swelling, deformity, erythema or bony tenderness. Normal range of motion. Tenderness present over the medial joint line and lateral joint line.     Right lower leg: No edema.     Left lower leg: No edema.  Skin:    General: Skin is warm and dry.  Neurological:     General: No focal  deficit present.     Mental Status: She is alert and oriented to person, place, and time.  Psychiatric:        Mood and Affect: Mood normal.        Behavior: Behavior normal.     No results found for any visits on 11/03/24.      Assessment & Plan:  Chronic pain of right knee Assessment & Plan: Chronic right knee pain worsening, no swelling or redness, pain with walking. Generalized tenderness throughout on exam. Normal ROM.  - Ordered x-ray of right knee. - Referred to orthopedics for evaluation and potential knee injection. - Prescribed Celebrex  for pain management. -  Follow up if symptoms do not improve in 4-6 weeks or if symptoms worsen.   Orders: -     DG Knee 1-2 Views Right -     Ambulatory referral to Orthopedic Surgery -     Celecoxib ; Take 1 capsule (100 mg total) by mouth 2 (two) times daily.  Dispense: 60 capsule; Refill: 1  Pain of right thumb Assessment & Plan: Patient presents today with acute right thumb pain at night, affecting sleep, no swelling or decreased mobility. Normal physical exam, mild tenderness over MCP joint. No snuffbox tenderness or erythema.  - Ordered x-ray of right thumb. - Prescribed Celebrex  for pain management. - Follow up if symptoms do not improve in 4-6 weeks or if symptoms worsen.   Orders: -     DG Hand Complete Right -     Celecoxib ; Take 1 capsule (100 mg total) by mouth 2 (two) times daily.  Dispense: 60 capsule; Refill: 1  Gastroesophageal reflux disease without esophagitis Assessment & Plan: GERD symptoms worsened by heat, previous Pepcid  ineffective. - Start omeprazole  daily for symptoms. Advised to take first thing on an empty stomach.   Orders: -     Omeprazole ; Take 1 capsule (20 mg total) by mouth daily.  Dispense: 30 capsule; Refill: 3  Generalized anxiety disorder Assessment & Plan: Prozac  ineffective for anxiety management. - Scheduled follow-up appointment in six weeks to discuss anxiety management. - Continue Prozac  daily.      Return in about 6 weeks (around 12/15/2024) for anxiety .  Keegan Bensch, PA-C  "

## 2024-11-04 NOTE — Progress Notes (Signed)
 Called pt and left voicemail.

## 2024-11-10 NOTE — Progress Notes (Signed)
 Pt has appt with ortho jan 28th

## 2024-12-15 ENCOUNTER — Ambulatory Visit: Admitting: Physician Assistant

## 2025-01-20 ENCOUNTER — Ambulatory Visit: Admitting: Physician Assistant
